# Patient Record
Sex: Female | Born: 1975 | Race: White | Hispanic: No | Smoking: Current every day smoker
Health system: Southern US, Community
[De-identification: ages and names within clinical notes are randomized; demographics above are authoritative.]

## PROBLEM LIST (undated history)

## (undated) DIAGNOSIS — K029 Dental caries, unspecified: Secondary | ICD-10-CM

## (undated) DIAGNOSIS — F329 Major depressive disorder, single episode, unspecified: Secondary | ICD-10-CM

## (undated) DIAGNOSIS — K589 Irritable bowel syndrome without diarrhea: Secondary | ICD-10-CM

## (undated) DIAGNOSIS — B9689 Other specified bacterial agents as the cause of diseases classified elsewhere: Secondary | ICD-10-CM

## (undated) DIAGNOSIS — K219 Gastro-esophageal reflux disease without esophagitis: Secondary | ICD-10-CM

## (undated) DIAGNOSIS — R319 Hematuria, unspecified: Secondary | ICD-10-CM

## (undated) DIAGNOSIS — N760 Acute vaginitis: Secondary | ICD-10-CM

## (undated) DIAGNOSIS — F410 Panic disorder [episodic paroxysmal anxiety] without agoraphobia: Secondary | ICD-10-CM

## (undated) DIAGNOSIS — N898 Other specified noninflammatory disorders of vagina: Secondary | ICD-10-CM

## (undated) DIAGNOSIS — R1032 Left lower quadrant pain: Secondary | ICD-10-CM

## (undated) DIAGNOSIS — K5900 Constipation, unspecified: Secondary | ICD-10-CM

## (undated) DIAGNOSIS — R300 Dysuria: Secondary | ICD-10-CM

## (undated) DIAGNOSIS — N3281 Overactive bladder: Secondary | ICD-10-CM

## (undated) DIAGNOSIS — G43909 Migraine, unspecified, not intractable, without status migrainosus: Secondary | ICD-10-CM

## (undated) DIAGNOSIS — B009 Herpesviral infection, unspecified: Secondary | ICD-10-CM

## (undated) DIAGNOSIS — R232 Flushing: Secondary | ICD-10-CM

## (undated) DIAGNOSIS — I10 Essential (primary) hypertension: Secondary | ICD-10-CM

## (undated) DIAGNOSIS — Z9223 Personal history of estrogen therapy: Secondary | ICD-10-CM

## (undated) HISTORY — DX: Other specified bacterial agents as the cause of diseases classified elsewhere: B96.89

## (undated) HISTORY — DX: Dental caries, unspecified: K02.9

## (undated) HISTORY — PX: ABDOMINAL HYSTERECTOMY: SHX81

## (undated) HISTORY — DX: Panic disorder (episodic paroxysmal anxiety): F41.0

## (undated) HISTORY — DX: Flushing: R23.2

## (undated) HISTORY — DX: Essential (primary) hypertension: I10

## (undated) HISTORY — DX: Constipation, unspecified: K59.00

## (undated) HISTORY — DX: Overactive bladder: N32.81

## (undated) HISTORY — DX: Personal history of estrogen therapy: Z92.23

## (undated) HISTORY — DX: Acute vaginitis: N76.0

## (undated) HISTORY — DX: Other specified noninflammatory disorders of vagina: N89.8

## (undated) HISTORY — DX: Hematuria, unspecified: R31.9

## (undated) HISTORY — DX: Herpesviral infection, unspecified: B00.9

## (undated) HISTORY — DX: Left lower quadrant pain: R10.32

## (undated) HISTORY — DX: Major depressive disorder, single episode, unspecified: F32.9

## (undated) HISTORY — DX: Dysuria: R30.0

---

## 2001-06-02 ENCOUNTER — Ambulatory Visit (HOSPITAL_COMMUNITY): Admission: AD | Admit: 2001-06-02 | Discharge: 2001-06-02 | Payer: Self-pay | Admitting: *Deleted

## 2001-06-15 ENCOUNTER — Ambulatory Visit (HOSPITAL_COMMUNITY): Admission: RE | Admit: 2001-06-15 | Discharge: 2001-06-15 | Payer: Self-pay | Admitting: *Deleted

## 2001-06-15 ENCOUNTER — Encounter: Payer: Self-pay | Admitting: *Deleted

## 2001-08-02 ENCOUNTER — Emergency Department (HOSPITAL_COMMUNITY): Admission: EM | Admit: 2001-08-02 | Discharge: 2001-08-03 | Payer: Self-pay | Admitting: Internal Medicine

## 2001-10-08 ENCOUNTER — Ambulatory Visit (HOSPITAL_COMMUNITY): Admission: RE | Admit: 2001-10-08 | Discharge: 2001-10-08 | Payer: Self-pay | Admitting: *Deleted

## 2001-10-24 ENCOUNTER — Inpatient Hospital Stay (HOSPITAL_COMMUNITY): Admission: RE | Admit: 2001-10-24 | Discharge: 2001-10-28 | Payer: Self-pay | Admitting: *Deleted

## 2001-10-27 ENCOUNTER — Encounter: Payer: Self-pay | Admitting: *Deleted

## 2003-03-03 ENCOUNTER — Encounter: Payer: Self-pay | Admitting: *Deleted

## 2003-03-03 ENCOUNTER — Emergency Department (HOSPITAL_COMMUNITY): Admission: EM | Admit: 2003-03-03 | Discharge: 2003-03-03 | Payer: Self-pay | Admitting: *Deleted

## 2003-04-29 ENCOUNTER — Encounter: Payer: Self-pay | Admitting: Emergency Medicine

## 2003-04-29 ENCOUNTER — Emergency Department (HOSPITAL_COMMUNITY): Admission: EM | Admit: 2003-04-29 | Discharge: 2003-04-29 | Payer: Self-pay | Admitting: Emergency Medicine

## 2003-06-16 ENCOUNTER — Emergency Department (HOSPITAL_COMMUNITY): Admission: EM | Admit: 2003-06-16 | Discharge: 2003-06-16 | Payer: Self-pay | Admitting: Emergency Medicine

## 2003-12-21 ENCOUNTER — Emergency Department (HOSPITAL_COMMUNITY): Admission: EM | Admit: 2003-12-21 | Discharge: 2003-12-22 | Payer: Self-pay

## 2004-01-26 ENCOUNTER — Emergency Department (HOSPITAL_COMMUNITY): Admission: EM | Admit: 2004-01-26 | Discharge: 2004-01-26 | Payer: Self-pay | Admitting: Emergency Medicine

## 2004-02-12 ENCOUNTER — Inpatient Hospital Stay (HOSPITAL_COMMUNITY): Admission: RE | Admit: 2004-02-12 | Discharge: 2004-02-15 | Payer: Self-pay | Admitting: Obstetrics & Gynecology

## 2004-10-12 ENCOUNTER — Emergency Department (HOSPITAL_COMMUNITY): Admission: EM | Admit: 2004-10-12 | Discharge: 2004-10-12 | Payer: Self-pay | Admitting: Emergency Medicine

## 2005-03-25 ENCOUNTER — Emergency Department (HOSPITAL_COMMUNITY): Admission: EM | Admit: 2005-03-25 | Discharge: 2005-03-25 | Payer: Self-pay | Admitting: Emergency Medicine

## 2005-05-16 ENCOUNTER — Emergency Department (HOSPITAL_COMMUNITY): Admission: EM | Admit: 2005-05-16 | Discharge: 2005-05-16 | Payer: Self-pay | Admitting: Emergency Medicine

## 2005-06-14 ENCOUNTER — Emergency Department (HOSPITAL_COMMUNITY): Admission: EM | Admit: 2005-06-14 | Discharge: 2005-06-14 | Payer: Self-pay | Admitting: Emergency Medicine

## 2005-11-18 ENCOUNTER — Emergency Department (HOSPITAL_COMMUNITY): Admission: EM | Admit: 2005-11-18 | Discharge: 2005-11-18 | Payer: Self-pay | Admitting: Emergency Medicine

## 2005-12-24 ENCOUNTER — Emergency Department (HOSPITAL_COMMUNITY): Admission: EM | Admit: 2005-12-24 | Discharge: 2005-12-24 | Payer: Self-pay | Admitting: Family Medicine

## 2006-03-21 ENCOUNTER — Emergency Department (HOSPITAL_COMMUNITY): Admission: EM | Admit: 2006-03-21 | Discharge: 2006-03-21 | Payer: Self-pay | Admitting: Emergency Medicine

## 2006-03-24 ENCOUNTER — Emergency Department (HOSPITAL_COMMUNITY): Admission: EM | Admit: 2006-03-24 | Discharge: 2006-03-25 | Payer: Self-pay | Admitting: Emergency Medicine

## 2006-04-03 ENCOUNTER — Emergency Department (HOSPITAL_COMMUNITY): Admission: EM | Admit: 2006-04-03 | Discharge: 2006-04-03 | Payer: Self-pay | Admitting: Family Medicine

## 2006-05-27 ENCOUNTER — Emergency Department (HOSPITAL_COMMUNITY): Admission: EM | Admit: 2006-05-27 | Discharge: 2006-05-27 | Payer: Self-pay | Admitting: Family Medicine

## 2007-09-16 ENCOUNTER — Encounter: Admission: RE | Admit: 2007-09-16 | Discharge: 2007-09-27 | Payer: Self-pay | Admitting: Family Medicine

## 2007-10-15 ENCOUNTER — Emergency Department (HOSPITAL_COMMUNITY): Admission: EM | Admit: 2007-10-15 | Discharge: 2007-10-15 | Payer: Self-pay | Admitting: Emergency Medicine

## 2007-11-09 ENCOUNTER — Emergency Department (HOSPITAL_COMMUNITY): Admission: EM | Admit: 2007-11-09 | Discharge: 2007-11-10 | Payer: Self-pay | Admitting: Emergency Medicine

## 2009-05-18 ENCOUNTER — Emergency Department (HOSPITAL_COMMUNITY): Admission: EM | Admit: 2009-05-18 | Discharge: 2009-05-18 | Payer: Self-pay | Admitting: Emergency Medicine

## 2009-08-01 ENCOUNTER — Emergency Department (HOSPITAL_COMMUNITY): Admission: EM | Admit: 2009-08-01 | Discharge: 2009-08-01 | Payer: Self-pay | Admitting: Emergency Medicine

## 2009-09-10 ENCOUNTER — Emergency Department (HOSPITAL_COMMUNITY): Admission: EM | Admit: 2009-09-10 | Discharge: 2009-09-10 | Payer: Self-pay | Admitting: Emergency Medicine

## 2009-10-22 ENCOUNTER — Emergency Department (HOSPITAL_COMMUNITY): Admission: EM | Admit: 2009-10-22 | Discharge: 2009-10-22 | Payer: Self-pay | Admitting: Emergency Medicine

## 2009-11-19 ENCOUNTER — Emergency Department (HOSPITAL_COMMUNITY): Admission: EM | Admit: 2009-11-19 | Discharge: 2009-11-19 | Payer: Self-pay | Admitting: Emergency Medicine

## 2010-03-24 ENCOUNTER — Emergency Department (HOSPITAL_COMMUNITY): Admission: EM | Admit: 2010-03-24 | Discharge: 2010-03-25 | Payer: Self-pay | Admitting: Emergency Medicine

## 2010-04-22 ENCOUNTER — Emergency Department (HOSPITAL_COMMUNITY): Admission: EM | Admit: 2010-04-22 | Discharge: 2010-04-22 | Payer: Self-pay | Admitting: Emergency Medicine

## 2010-09-03 ENCOUNTER — Emergency Department (HOSPITAL_COMMUNITY): Admission: EM | Admit: 2010-09-03 | Discharge: 2010-09-03 | Payer: Self-pay | Admitting: Emergency Medicine

## 2011-01-25 ENCOUNTER — Emergency Department (HOSPITAL_COMMUNITY)
Admission: EM | Admit: 2011-01-25 | Discharge: 2011-01-25 | Disposition: A | Discharge status: Home / Self Care | Payer: Self-pay | Attending: Emergency Medicine | Admitting: Emergency Medicine

## 2011-01-25 ENCOUNTER — Emergency Department (HOSPITAL_COMMUNITY): Payer: Self-pay

## 2011-01-25 DIAGNOSIS — R112 Nausea with vomiting, unspecified: Secondary | ICD-10-CM | POA: Insufficient documentation

## 2011-01-25 DIAGNOSIS — R109 Unspecified abdominal pain: Secondary | ICD-10-CM | POA: Insufficient documentation

## 2011-01-25 DIAGNOSIS — K5289 Other specified noninfective gastroenteritis and colitis: Secondary | ICD-10-CM | POA: Insufficient documentation

## 2011-01-25 LAB — CBC
HCT: 37.6 % (ref 36.0–46.0)
Hemoglobin: 12.9 g/dL (ref 12.0–15.0)
MCH: 31.2 pg (ref 26.0–34.0)
MCHC: 34.3 g/dL (ref 30.0–36.0)
MCV: 90.8 fL (ref 78.0–100.0)
Platelets: 211 10*3/uL (ref 150–400)
RBC: 4.14 MIL/uL (ref 3.87–5.11)
RDW: 13.2 % (ref 11.5–15.5)
WBC: 16.9 10*3/uL — ABNORMAL HIGH (ref 4.0–10.5)

## 2011-01-25 LAB — URINALYSIS, ROUTINE W REFLEX MICROSCOPIC
Bilirubin Urine: NEGATIVE
Hgb urine dipstick: NEGATIVE
Ketones, ur: >80 mg/dL — AB
Leukocytes, UA: NEGATIVE
Nitrite: NEGATIVE
Protein, ur: 100 mg/dL — AB
Specific Gravity, Urine: 1.01 (ref 1.005–1.030)
Urine Glucose, Fasting: NEGATIVE mg/dL
Urobilinogen, UA: 1 mg/dL (ref 0.0–1.0)
pH: >9 — ABNORMAL HIGH (ref 5.0–8.0)

## 2011-01-25 LAB — DIFFERENTIAL
Basophils Absolute: 0 10*3/uL (ref 0.0–0.1)
Basophils Relative: 0 % (ref 0–1)
Eosinophils Absolute: 0 10*3/uL (ref 0.0–0.7)
Eosinophils Relative: 0 % (ref 0–5)
Lymphocytes Relative: 5 % — ABNORMAL LOW (ref 12–46)
Lymphs Abs: 0.9 10*3/uL (ref 0.7–4.0)
Monocytes Absolute: 0.8 10*3/uL (ref 0.1–1.0)
Monocytes Relative: 4 % (ref 3–12)
Neutro Abs: 15.2 10*3/uL — ABNORMAL HIGH (ref 1.7–7.7)
Neutrophils Relative %: 90 % — ABNORMAL HIGH (ref 43–77)

## 2011-01-25 LAB — COMPREHENSIVE METABOLIC PANEL
ALT: 12 U/L (ref 0–35)
AST: 19 U/L (ref 0–37)
Albumin: 4.3 g/dL (ref 3.5–5.2)
Alkaline Phosphatase: 43 U/L (ref 39–117)
BUN: 9 mg/dL (ref 6–23)
CO2: 23 mEq/L (ref 19–32)
Calcium: 9.2 mg/dL (ref 8.4–10.5)
Chloride: 104 mEq/L (ref 96–112)
Creatinine, Ser: 0.73 mg/dL (ref 0.4–1.2)
GFR calc Af Amer: >60 mL/min (ref >60)
GFR calc non Af Amer: >60 mL/min (ref >60)
Glucose, Bld: 113 mg/dL — ABNORMAL HIGH (ref 70–99)
Potassium: 3.3 mEq/L — ABNORMAL LOW (ref 3.5–5.1)
Sodium: 139 mEq/L (ref 135–145)
Total Bilirubin: 0.9 mg/dL (ref 0.3–1.2)
Total Protein: 7.4 g/dL (ref 6.0–8.3)

## 2011-01-25 LAB — URINE MICROSCOPIC-ADD ON

## 2011-01-25 LAB — POCT PREGNANCY, URINE: Preg Test, Ur: NEGATIVE

## 2011-01-25 LAB — LIPASE, BLOOD: Lipase: 19 U/L (ref 11–59)

## 2011-01-25 MED ORDER — IOHEXOL 300 MG/ML  SOLN
100.0000 mL | Freq: Once | INTRAMUSCULAR | Status: AC | PRN
  Administered 2011-01-25: 100 mL via INTRAVENOUS

## 2011-02-11 LAB — COMPREHENSIVE METABOLIC PANEL
ALT: 9 U/L (ref 0–35)
AST: 15 U/L (ref 0–37)
Albumin: 3.9 g/dL (ref 3.5–5.2)
Alkaline Phosphatase: 38 U/L — ABNORMAL LOW (ref 39–117)
BUN: 8 mg/dL (ref 6–23)
CO2: 26 mEq/L (ref 19–32)
Calcium: 9.2 mg/dL (ref 8.4–10.5)
Chloride: 106 mEq/L (ref 96–112)
Creatinine, Ser: 0.69 mg/dL (ref 0.4–1.2)
GFR calc Af Amer: >60 mL/min (ref >60)
GFR calc non Af Amer: >60 mL/min (ref >60)
Glucose, Bld: 95 mg/dL (ref 70–99)
Potassium: 3.8 mEq/L (ref 3.5–5.1)
Sodium: 138 mEq/L (ref 135–145)
Total Bilirubin: 0.5 mg/dL (ref 0.3–1.2)
Total Protein: 6.8 g/dL (ref 6.0–8.3)

## 2011-02-11 LAB — URINALYSIS, ROUTINE W REFLEX MICROSCOPIC
Bilirubin Urine: NEGATIVE
Glucose, UA: NEGATIVE mg/dL
Hgb urine dipstick: NEGATIVE
Ketones, ur: NEGATIVE mg/dL
Nitrite: NEGATIVE
Protein, ur: NEGATIVE mg/dL
Specific Gravity, Urine: 1.005 (ref 1.005–1.030)
Urobilinogen, UA: 0.2 mg/dL (ref 0.0–1.0)
pH: 6 (ref 5.0–8.0)

## 2011-02-11 LAB — DIFFERENTIAL
Basophils Absolute: 0 10*3/uL (ref 0.0–0.1)
Basophils Relative: 1 % (ref 0–1)
Eosinophils Absolute: 0 10*3/uL (ref 0.0–0.7)
Eosinophils Relative: 1 % (ref 0–5)
Lymphocytes Relative: 30 % (ref 12–46)
Lymphs Abs: 1.9 10*3/uL (ref 0.7–4.0)
Monocytes Absolute: 0.3 10*3/uL (ref 0.1–1.0)
Monocytes Relative: 5 % (ref 3–12)
Neutro Abs: 3.9 10*3/uL (ref 1.7–7.7)
Neutrophils Relative %: 63 % (ref 43–77)

## 2011-02-11 LAB — CBC
HCT: 36.9 % (ref 36.0–46.0)
Hemoglobin: 12.5 g/dL (ref 12.0–15.0)
MCH: 31.7 pg (ref 26.0–34.0)
MCHC: 33.8 g/dL (ref 30.0–36.0)
MCV: 94 fL (ref 78.0–100.0)
Platelets: 202 10*3/uL (ref 150–400)
RBC: 3.93 MIL/uL (ref 3.87–5.11)
RDW: 12.4 % (ref 11.5–15.5)
WBC: 6.1 10*3/uL (ref 4.0–10.5)

## 2011-02-17 LAB — URINALYSIS, ROUTINE W REFLEX MICROSCOPIC
Bilirubin Urine: NEGATIVE
Glucose, UA: NEGATIVE mg/dL
Hgb urine dipstick: NEGATIVE
Nitrite: NEGATIVE
Protein, ur: NEGATIVE mg/dL
Specific Gravity, Urine: >1.03 — ABNORMAL HIGH (ref 1.005–1.030)
Urobilinogen, UA: 0.2 mg/dL (ref 0.0–1.0)
pH: 6 (ref 5.0–8.0)

## 2011-02-17 LAB — PREGNANCY, URINE: Preg Test, Ur: NEGATIVE

## 2011-03-02 LAB — URINALYSIS, ROUTINE W REFLEX MICROSCOPIC
Bilirubin Urine: NEGATIVE
Glucose, UA: NEGATIVE mg/dL
Hgb urine dipstick: NEGATIVE
Ketones, ur: NEGATIVE mg/dL
Nitrite: NEGATIVE
Protein, ur: NEGATIVE mg/dL
Specific Gravity, Urine: 1.01 (ref 1.005–1.030)
Urobilinogen, UA: 0.2 mg/dL (ref 0.0–1.0)
pH: 7.5 (ref 5.0–8.0)

## 2011-03-04 LAB — URINE MICROSCOPIC-ADD ON

## 2011-03-04 LAB — URINALYSIS, ROUTINE W REFLEX MICROSCOPIC
Bilirubin Urine: NEGATIVE
Glucose, UA: NEGATIVE mg/dL
Hgb urine dipstick: NEGATIVE
Ketones, ur: NEGATIVE mg/dL
Nitrite: NEGATIVE
Protein, ur: NEGATIVE mg/dL
Specific Gravity, Urine: 1.025 (ref 1.005–1.030)
Urobilinogen, UA: 0.2 mg/dL (ref 0.0–1.0)
pH: 5.5 (ref 5.0–8.0)

## 2011-03-04 LAB — URINE CULTURE: Colony Count: 1000

## 2011-03-04 LAB — WET PREP, GENITAL
Trich, Wet Prep: NONE SEEN
Yeast Wet Prep HPF POC: NONE SEEN

## 2011-03-04 LAB — GC/CHLAMYDIA PROBE AMP, GENITAL
Chlamydia, DNA Probe: NEGATIVE
GC Probe Amp, Genital: NEGATIVE

## 2011-03-05 LAB — URINE CULTURE: Colony Count: 3000

## 2011-03-05 LAB — URINALYSIS, ROUTINE W REFLEX MICROSCOPIC
Bilirubin Urine: NEGATIVE
Glucose, UA: NEGATIVE mg/dL
Ketones, ur: NEGATIVE mg/dL
Nitrite: NEGATIVE
Protein, ur: NEGATIVE mg/dL
Specific Gravity, Urine: 1.025 (ref 1.005–1.030)
Urobilinogen, UA: 0.2 mg/dL (ref 0.0–1.0)
pH: 6 (ref 5.0–8.0)

## 2011-03-05 LAB — URINE MICROSCOPIC-ADD ON

## 2011-03-06 LAB — URINALYSIS, ROUTINE W REFLEX MICROSCOPIC
Bilirubin Urine: NEGATIVE
Glucose, UA: NEGATIVE mg/dL
Hgb urine dipstick: NEGATIVE
Ketones, ur: NEGATIVE mg/dL
Nitrite: NEGATIVE
Protein, ur: NEGATIVE mg/dL
Specific Gravity, Urine: 1.02 (ref 1.005–1.030)
Urobilinogen, UA: 1 mg/dL (ref 0.0–1.0)
pH: 7.5 (ref 5.0–8.0)

## 2011-03-06 LAB — RAPID STREP SCREEN (MED CTR MEBANE ONLY): Streptococcus, Group A Screen (Direct): POSITIVE — AB

## 2011-03-09 LAB — URINALYSIS, ROUTINE W REFLEX MICROSCOPIC
Bilirubin Urine: NEGATIVE
Glucose, UA: NEGATIVE mg/dL
Hgb urine dipstick: NEGATIVE
Ketones, ur: NEGATIVE mg/dL
Nitrite: NEGATIVE
Protein, ur: NEGATIVE mg/dL
Specific Gravity, Urine: 1.01 (ref 1.005–1.030)
Urobilinogen, UA: 1 mg/dL (ref 0.0–1.0)
pH: 6.5 (ref 5.0–8.0)

## 2011-03-09 LAB — WET PREP, GENITAL
Trich, Wet Prep: NONE SEEN
WBC, Wet Prep HPF POC: NONE SEEN
Yeast Wet Prep HPF POC: NONE SEEN

## 2011-03-09 LAB — POCT PREGNANCY, URINE: Preg Test, Ur: NEGATIVE

## 2011-03-09 LAB — GC/CHLAMYDIA PROBE AMP, GENITAL
Chlamydia, DNA Probe: NEGATIVE
GC Probe Amp, Genital: NEGATIVE

## 2011-04-17 NOTE — Group Therapy Note (Signed)
[facility]  Patient:    Debbie [name], Debbie [name] Visit Number: [id number] MRN: [id number]          Service Type: OBS Location: 4A A417 01 Attending Physician:  Jeri Cos. Dictated by:   Langley Gauss, M.D. Admit Date:  10/24/2001   CC:         Labor & Delivery, [facility]  Christin Bach, M.D.   Progress Note  Status post repeat low transverse cesarean section October 24, 2001. 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  The patient has accumulatively postoperatively now received 4 units of packed red blood cells transfused. Over the last 24 hours her urine output has been excellent with clear yellow urine.  Vital signs have remained stable with no tachycardia and no hypertension.  Hemoglobin following the fourth unit at 6.7.  Of note, the patients admission hemoglobin prior to surgery was 8.3.  SUBJECTIVE:  The patient does seem to tolerate a hemoglobin of 6.7 very well, with no significant light headedness with standing, no headaches, no ringing in the ears.  Postoperatively she has yet to recover bowel function.  Specifically, no passage of flatus.  She does, however, have some hypoactive bowel sounds.  SUBJECTIVE:  Physical examination shows edema is markedly improved over the last 48 hours.  She has received two doses of 20 mg of IV Lasix, which have resulted in increased urine output as well as decreased edematous appearance.  PLAN:  1.  Check CBC a.m. of October 26, 2001 in consideration of patients current      clinical status and good tolerance of chronic anemia.  Would not      transfuse the patient unless hemoglobin is below 6 or if clinical      condition changes from current status and warrants transfusion.  2.  The patient still has a Foley catheter in place as well as dressing on      the wound and JP.  These can possibly be removed on October 26, 2001 if      blood indices are stable and the patient is prepared to ambulate.Dictated y:   Langley Gauss, M.D. Attending Physician:  Jeri Cos. DD:  10/26/01 TD:  10/26/01 Job: [id number]

## 2011-04-17 NOTE — Op Note (Signed)
[facility]  Patient:    Debbie [name], Debbie [name] Visit Number: [id number] MRN: [id number]          Service Type: OBS Location: 4A A417 01 Attending Physician:  Jeri Cos. Dictated by:   Langley Gauss, M.D. Proc. Date: 10/24/01 Admit Date:  10/24/2001                             Operative Report  PREOPERATIVE DIAGNOSES: 1. Previous low transverse cesarean section x 3. 2. Patient desires permanent sterilization.  PROCEDURE PERFORMED:  Repeat low transverse cesarean section with intraoperative bilateral tubal ligation delivered as a 5 pound 10 ounce female infant.  SURGEON:  Langley Gauss, M.D.  ANESTHESIA:  Spinal.  PEDIATRICIAN:  J. Aniou [place], M.D.  ESTIMATED BLOOD LOSS:  2-300 cc.  INTRAOPERATIVE BLOOD PRODUCTS:  Two units of pack red blood cells transfused to the patient in the operating room.  An additional 1 unit of packed red blood cells transfused to the patient in the recovery room.  DRAINS:  Foley catheter to straight drainage, draining clear yellow urine.  JP catheter within the subcutaneous space.  FINDINGS:  At time of surgery complicating the surgery:  The patient was noted to have 3 prior low transverse cesarean section and thus adhesive disease was encountered.  The fundal portion of the uterus was densely adherent to the anterior pelvic perineum making entry into the perineal cavity very difficult.  Likewise very dense fibrous adhesive bands were located bilaterally extending from the cornual portion of the uterus on each side to the anterior pelvic perineum in very close continuity with round ligament and fallopian tube on each side.  Having intraoperative blood loss was due to the very difficult entry into the perineal cavity thus multiple small bleeding sites were noted due to blunt dissection of perineum off of the uterus.  After delivery of the infant there was very difficult visualization of the uterine incision  with a small extension on the left and poor visualization. The uterus could not be manipulated secondary to the adhesive disease present, thus heavier than usual blood loss occurred from the edges of the uterine incision.  Likewise an additional bleeding was noted when the fibrous adhesive bands bilaterally were clamped and transected.  When there was noted to be backbleeding occurring from the adhesive band on the left after ligature had slipped.  HOSPITAL SUMMARY:  The patient was taken to the operating room, vital signs were stable.  The patient had spinal analgesic administered without complication.  She was then placed on the operating room table in a slight left lateral tilt, prepped and draped in usual sterile manner.  A Foley catheter was placed to straight drainage after assurance of adequate surgical analgesia.  A sharp knife was used to incise through the Pfannenstiel incision with dissection down to the fascial plane.  The fascia was then transected in a curvilinear manner utilizing the Mayo scissors to dissect off the underlying rectus muscle.  Fascial edges were then grasped using straight Kocher clamps and additional operative room was made by dissecting the rectus muscle in the midline of the overlying fascia.  The rectus muscles were then bluntly separated.  There was known to be very dense perineum here which made difficult entry into the perineal cavity.  Hemostat clamps were used to elevate perineum and an attempt was made to atraumatically enter the perineal cavity but this was not possible.  The hemostat clamps  were used to elevate visible perineum.  Continued to dissect deeper through it in an attempt to enter the perineal cavity.  At one point the perineal cavity was atraumatically and bluntly entered but efforts to extend the perineal incisions superiorly were limited due to the adhesive disease between the fundal portion of the uterus and the anterior perineal  wall.  The pelvic incision in the perineum, however, was extended inferiorly being careful not to enter the bladder.  Bladder blade was then placed.  A sharp knife was then used to score a low transverse uterine incision.  The lower uterine segment was poorly developed. Intact amniotic sac was encountered in the midline.  My index fingers then used to bluntly extend the uterine incision bilaterally.  Allis clamps were used to artificially rupture the membranes with findings of clear amniotic fluid.  My right hand then reached into the pelvic and uterine cavity.  The head of the infant was flexed.  I was unable at this time to elevate it through the incision due to a limited space provided.  Thus a Maylard incision was performed bilaterally in the rectus muscle.  This allowed me to elevate the head of the infant to the level of the incision which then allowed me to place the reusable Silastic suction in a ______ suction on the infant vertex.  A combination of fundal pressure and gentle traction at this time resulted in delivery of the infants vertex through the incision.  The remainder of the infant likewise delivered without difficulty.  Mouth and nares were bulb suctioned of clear amniotic fluid.  The cord was then milked towards the infant.  Cord was doubly clamped and cut and infant is handed to the nursing table where the awaiting pediatrician, Dr. Latrelle Dodrill is available.  Then I drew arterial cord gas, and cord blood from the umbilical cord.  Gentle traction on the umbilical cord resulted in separation of intact placenta. Intrauterine expiration revealed no retained placental fragments.  I was unable to exteriorize the uterus at this time secondary to the adhesive disease which limited mobility and manipulation of the uterus.  The edges of the uterine incision, however, were identified and grasped using the Pennington clamp.  The uterine incision was then closed utilizing 0 chronic  in a running locked fashion.  Multiple figure-of-eight sutures were required, along this incision then to maintain hemostasis.   During this time there was multiple bleeding spots along the perineal layer overlying the uterus resulting in fairly heavy blood loss.  There was continued blood loss at the fundal portion of the uterus which could not be visualized thus the adhesive bands to the cornual portion of the uterus bilaterally had to be transected.  Likewise patient desired permanent sterilization and we were not able to identify fallopian tube structures with the current visualization.  I was able to identify each cornual portion of the uterus and slip my fingers to find the free space in between the dense adhesive band and the round ligamenT of fallopian tube bilaterally. Each of these thick pedicles were then clamped utilizing a Kelly clamp and ligated utilizing a Heaney suture of 0 Vicryl.  The left as stated previously slipped, resultant backbleeding was noted to occur, this pedicle was then again clamped with a Kelly clamp and again then ligated with 0 Vicryl in a Heaney fashion.  After transection of these thick adhesive bands I was able to exteriorize the uterus which revealed the uterine incision, again, to have some small  bleeders requiring figure-of-eight sutures of 0 Vicryl.  The fallopian tubes were identified on each side and grasped with Babcock clamps.  Verification of the fallopian tube structures was made by tracing them out to its fimbriated end.  Each of the tubes was then separately elevated and two ties of #1 plain suture were based at the base of the loop of ______.  This allowed removal of a knuckle of right fallopian tube as well as left fallopian tube.  A suture was placed through the left.  The pedicles from the tubal ligation are noted to be excellent hemostasis.  The ovary is noted to be normal in appearance.  The cul-de-sac is then irrigated free of  all clots.  Hemostasis is assured within the pelvic cavity thus uterus is returned to the pelvic cavity.  The perineal edges are then grasped utilizing Kelly clamps and the perineum is closed with a continuous running 0 chromic suture.  Copious irrigation now was performed of the gutters bilaterally to confirm hemostasis.  Sponge and instrument counts were correct x 2 at this point.  the rectus muscles were likewise reapproximated midline utilizing 0 chromic suture.  Again, bleeding had been noted from the maylard incisions bilaterally which contributed to our heavy blood loss.  The fascia was then identified. Deep fascia is closed with a continuous running #1 PDS suture.  A JP drain is then placed within the subcutaneous space with a separate exit wound to the right apex of the incision, 3 interrupted #1 PDS sutures are then placed through and through the skin edges to facilitate reapproximation of the skin edges.  The skin is then completed closed utilizing skin staples.  Prior to stapling 20 cc. of 0.5% bupivacaine had been injected along the skin incision.  The patient tolerated the procedure well with no change in her vital signs throughout the operative procedure. Two units of packed red blood cells were transfused and plan to give a third in the recovery room as the patient is transported to the recovery room in stable condition.  The patient was noted to have significant itching during the operative procedure which was treated with IV Benadryl likely due to an adverse reaction to the IV Ancef administered. Dictated by:   Langley Gauss, M.D. Attending Physician:  Jeri Cos. DD:  10/24/01 TD:  10/24/01 Job: [id number]

## 2011-04-17 NOTE — Discharge Summary (Signed)
[facility]  Patient:    Debbie [name], Debbie [name] Visit Number: [id number] MRN: [id number]          Service Type: OBS Location: LDR LDR1 01 Attending Physician:  Jeri Cos. Dictated by:   Langley Gauss, M.D. Admit Date:  10/08/2001 Discharge Date: 10/08/2001                             Discharge Summary  OBSTETRICAL OBSERVATION NOTE  HISTORY OF PRESENT ILLNESS:  This is a 35 year old gravida 4, para 3 currently at 35-6/[redacted] weeks gestation who presents to [facility] complaining of seeing spots before her eyes as well as having a bifrontal headache x3 days duration.  The patient states that this is similar to the headache that she had previously in the pregnancy.  She has taken extra strength Tylenol with no relief, now, x3 days duration.  The headache is no worse than yesterday, or the day previously.  The patient also complains of increased swelling in her extremities.  The patients prenatal course is otherwise uncomplicated.  She is scheduled for a repeat low-transverse cesarean section secondary to a history of 3 prior cesarean sections.  PAST MEDICAL HISTORY: 1. The patient intermittently does have migraine-type headaches treated    effectively with narcotics on a p.r.n. basis. 2. Low-transverse cesarean section x3.  PHYSICAL EXAMINATION:  GENERAL:  The patient is in no acute distress, but does complain of bifrontal headache.  VITAL SIGNS:  Temperature 98.3, pulse 89, respiratory rate is 17, blood pressure 145/72, 143/81.  Pertinently a trace of protein.  HEENT:  Bifrontal tenderness in the area of the headache.  No sinus congestion.  Thyroid is not palpable.  NECK:  Supple.  LUNGS:  Clear.  CARDIOVASCULAR:  Regular rate and rhythm.  ABDOMEN:  Soft and nontender.  A Pfannenstiel incision is noted.  Fundal height is consistent with 36-5/[redacted] weeks gestation.  She is vertex presentation by Leopolds maneuvers.  EXTREMITIES:   Reveal only a trace pretibial edema.  X-RAY AND LABORATORY DATA:  External fetal monitor reviewed.  Reveals no uterine activity identified.  There is a reactive nonstress test noted with baseline in the 150s with accelerations of greater than 15 beats per minute, times greater than 15 seconds duration.  No fetal heart rate decelerations are noted.  Long term variability is noted to be normal.  ASSESSMENT:  A 35-6/7 weeks intrauterine pregnancy with migraine-type headaches unresponsive to p.o. Tylenol.  The patient with minimally elevated blood pressure but no other signs or symptoms of preeclampsia.  PLAN:  She is treated, at this time, with Tylox x1 and thereafter given outpatient prescription for Lortab 10/500 one p.o. q.6h. p.r.n. headache.  the patient will continue with modified bedrest and pertinently follow up in our office in 2 days time for a recheck for blood pressure. Dictated by:   Langley Gauss, M.D. Attending Physician:  Jeri Cos. DD:  10/09/01 TD:  10/10/01 Job: [id number]

## 2011-04-17 NOTE — Discharge Summary (Signed)
NAME:  Debbie [name], Debbie [name]                         ACCOUNT NO.:  [id number]   MEDICAL RECORD NO.:  [id number]                   PATIENT TYPE:  INP   LOCATION:  A404                                 FACILITY:  [facility]   PHYSICIAN:  Lazaro Arms, M.D.                DATE OF BIRTH:  [birth year]   DATE OF ADMISSION:  02/12/2004  DATE OF DISCHARGE:  02/15/2004                                 DISCHARGE SUMMARY   DISCHARGE DIAGNOSES:  1. Status post total abdominal hysterectomy/ right salpingo-oophorectomy.  2. Fibroid uterus.  3. 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   PROCEDURES:  Total abdominal hysterectomy, right salpingo-oophorectomy.   Please refer to transcribed history and physical and the operative note for  details of admission to the hospital.   HOSPITAL COURSE:  The patient was admitted postoperatively.  She had a  temperature elevation on the night of surgery to 101.3 degrees.  She did  have pneumonia two weeks ago that had resolved.  She had a repeat chest x-  ray that was clear.  She underwent breathing treatments and chest PT and her  temperature came down promptly.  She was also started on Levaquin.  The  patient otherwise has done well.  Her incision is clean, dry and intact.  Her abdominal exam is benign.  She has had flatus, bowel movements,  tolerating oral pain medicine.  She is ambulatory.  She is discharge to home  on the morning of postoperative day #3 in good stable condition.  Follow up  in the office on February 20, 2004, to have her staples removed.  She was given  Tylox and Motrin for pain, and Levaquin to continue because of her history  of recent pneumonia.  If she has any problems between now and then, she will  give [place] a call.     ___________________________________________                                         Lazaro Arms, M.D.   LHE/MEDQ  D:  02/15/2004  T:  02/15/2004  Job:  [id number]

## 2011-04-17 NOTE — Discharge Summary (Signed)
[facility]  Patient:    Debbie [name], Debbie [name] Visit Number: [id number] MRN: [id number]          Service Type: OBS Location: 4A A417 01 Attending Physician:  Jeri Cos. Dictated by:   Langley Gauss, M.D. Admit Date:  10/24/2001 Discharge Date: 10/28/2001                             Discharge Summary  DISCHARGE DIAGNOSIS: 1. Intrauterine pregnancy at 38+ weeks. 2. Previous low transverse cesarean section x 3. 3. The patient desires permanent sterilization.  PROCEDURES: 1. Repeat low transverse cesarean section with delivery of 5 pound 10.7 ounce    female infant. 2. Intraoperative tubal ligation. 3. Transfusion of three units total of packed red blood cells.  COMPLICATIONS: 1. Iron deficiency anemia during pregnancy. 2. Anemia secondary to blood loss. 3. Operative procedure complicated by significant adhesive disease resulting    in estimated blood loss intraoperatively of 2300 cc.  The patient received    two units of red blood cells intraoperatively.  A third unit of packed red    blood cells was transfused in the recovery room.  LABORATORY DATA AND X-RAY FINDINGS:  Admission hemoglobin and hematocrit 8.3 and 24.0.  HOSPITAL COURSE:  In the recovery room after receiving the two units, the patients hemoglobin and hematocrit was 6.5 and 16.3.  On postop day #2, hemoglobin was 6.1 and hematocrit 17.7.  The patient was not experiencing any signs or symptoms of anemia at that time and no additional units were transfused.  On the day of discharge, the patients hemoglobin was 6.2, hematocrit 18.7 and electrolytes within normal limits.  The patient is discharged to home on Hemocyte F #60 with no refill.  Staples were removed prior to discharge.  The patient is breast-feeding at the time of discharge. The patient is advised of the importance of taking the iron.  She is also aware that should any heavy vaginal bleeding occur, she should be  evaluated immediately due to her current anemic status.  The patient, however, is noted to be chronically anemic with her initial hemoglobin of 8.3.  She has exhibited no tendency towards excessive vaginal bleeding during the last 48 hours of hospitalization.  She has ambulated and tolerated regular, general diet.  Thus, the patient should do well following discharge.  The pediatrician is Dr. Latrelle Dodrill. Dictated by:   Langley Gauss, M.D. Attending Physician:  Jeri Cos. DD:  11/02/01 TD:  11/02/01 Job: [id number]

## 2011-04-17 NOTE — Op Note (Signed)
NAME:  Debbie [name], Debbie [name]                         ACCOUNT NO.:  [id number]   MEDICAL RECORD NO.:  [id number]                   PATIENT TYPE:  INP   LOCATION:  A404                                 FACILITY:  [facility]   PHYSICIAN:  Lazaro Arms, M.D.                DATE OF BIRTH:  [birth year]   DATE OF PROCEDURE:  02/12/2004  DATE OF DISCHARGE:                                 OPERATIVE REPORT   PREOPERATIVE DIAGNOSES:  1. Menometrorrhagia.  2. Dysmenorrhea.  3. Anemia.  4. Fibroids.  5. Right ovarian cyst.   POSTOPERATIVE DIAGNOSES:  1. Menometrorrhagia.  2. Dysmenorrhea.  3. Anemia.  4. Fibroids.  5. Right ovarian cyst.  6. Dense, wide, large adhesion of the anterior uterine wall to the posterior     rectus muscles on the left.   PROCEDURE:  1. Total abdominal hysterectomy.  2. Right salpingo-oophorectomy.   SURGEON:  Lazaro Arms, M.D.   ANESTHESIA:  General endotracheal.   FINDINGS:  The patient had really bad abdominal wall adhesions.  She really  did not have any intraperitoneal adhesions but it was quite difficult to  ensure.  We entered the peritoneal cavity without injuring the bladder or  bowel. She had a very dense, almost cord-like adhesions in the anterior  uterine wall to the posterior rectus muscles.  There was really no  peritoneal cavity on that side.  The right ovary had a cyst.  The left ovary  appeared to be normal.  The uterus had some small fibroids.  There were some  adhesions of the omentum as well.   DESCRIPTION OF PROCEDURE:  The patient was taken to the operating room and  placed in the supine position and underwent general endotracheal anesthesia.  Her vagina was prepped and Foley catheter was placed.  Her abdomen was  prepped and draped in the usual sterile fashion.  A Pfannenstiel skin  incision was made and taken to the rectus fascia.  The fascia was incised in  the midline; and it was obvious, at this point, that there was dense  adherence of  the fascia to the muscles and anterior uterine wall.  I was  able to get into the peritoneal cavity on the lateral right and then dissect  over.  I was not able to enter the peritoneum on the left.  I got the  infundibulopelvic pelvic ligament on the right.  It was double clamped, cut,  and double suture ligated with good hemostasis.  The round ligament was  clamped, cut, and suture ligated.  The uterine vessels were skeletonized on  the right and clamped, cut, and suture ligated.   The bladder was taken down off the uterus without difficulty.  Surprisingly  so after 3 C-sections.  I then, basically had to wrap my hand around the  uterus, see that there was no bowel adherent and use the electrocautery  Bovie to essentially  chisel the uterus off the posterior rectus sheath.  Hemoclips were used for hemostasis.  The utero-ovarian ligament was  isolated.  It was clamped, cut and double suture ligated and the left ovary  was conserved.  The uterine vessels were skeletonized on the left.  They  were clamped, cut and suture ligated.  Serial pedicles were taken down the  cervix through the cardinal ligament each pedicle being clamped, cut, and  suture ligated.   The vaginal angles were encountered.  They were cross-clamped and specimen  was removed.  Vaginal angle sutures were placed and the vagina was closed  with interrupted figure-of-eight sutures.  The pelvis was irrigated  vigorously.  There was good hemostasis.  The left ureter was identified and  found to be peristaltic and normal indeed to the area of the dissection and  the bladder was also found to be completely intact.  There was no blood in  the Foley line or catheter.  Intercede was placed around the left ovary to  prevent postoperative adhesions.  The dense cord-like adhesion on the left  rectus area that was adherent to the uterus was removed sharply with the  electrocautery unit.  Hemostasis was achieved with the electrocautery  unit  and with suturing.  The muscles were reapproximated loosely as best was  possible. It was very thin on the left.  The fascia was closed using #0  Vicryl running.  The subcutaneous tissue was made hemostatic and irrigated.  It was then undermined to allow free movement of the abdominal wall.  A  subcu pain pump catheter was placed; it was indicated for postoperative pain  management.  Interrupted 3-0 Monocryl's were placed to take the tension off  the skin.  Staples were placed for skin closure.  The patient tolerated the  procedure well.  She experienced 400 cc of blood loss and was taken to the  recovery room in good stable condition.  All counts were correct.  She  received Ancef prophylactically.  The uterus, cervix, right ovary and tube  were sent to pathology for evaluation.  The blood loss was 400 cc.      ___________________________________________                                            Lazaro Arms, M.D.   LHE/MEDQ  D:  02/12/2004  T:  02/12/2004  Job:  [id number]

## 2011-04-17 NOTE — H&P (Signed)
[facility]  Patient:    Debbie [name], Debbie [name] Visit Number: [id number] MRN: [id number]          Service Type: OBS Location: 4A A417 01 Attending Physician:  Jeri Cos. Dictated by:   Langley Gauss, M.D. Admit Date:  10/24/2001   CC:         Heidi Dach, M.D.   History and Physical  CHIEF COMPLAINT: This is a 35 year old gravida 4 para 3, three prior low transverse cesarean sections, who is at 38+ weeks gestation, admitted for repeat low transverse cesarean section and intraoperative tubal ligation.  HISTORY OF PRESENT ILLNESS: The patient accepts the inherent 2% failure rate associated with the procedure and understands that the tubal ligation is to be considered an irreversible procedure.  The patients prenatal course has been complicated by:  1. First trimester hyperemesis with weight loss.  2. The patient has been noncompliant with prenatal vitamins and has not     been on prenatal vitamins during the pregnancy, thus currently     experiencing iron deficiency anemia.  3. November 2000 the patient was noted to be positive for group B strep     in urine, which was treated at that time.  PAST MEDICAL HISTORY:  1. The patient has had three prior low transverse cesarean sections through     Pfannenstiel incision.  No complications noted during any of these     operative procedures.  2. 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  She did not     have any outbreaks during this pregnancy.  3. Also noted to have intermittent migraine headaches but these have not been     a problem during the pregnancy.  ALLERGIES: No known drug allergies.  CURRENT MEDICATIONS: Lortab p.r.n. for occasional headaches.  SOCIAL HISTORY: The patient is a smoker, smoking about two packs per day throughout the pregnancy despite counseling regarding the risks associated with it.  The father of the baby is named Daphine Deutscher.  PHYSICAL  EXAMINATION:  VITAL SIGNS: Height 5 feet 6 inches.  Prepregnancy weight 214 pounds.  On October 17, 2001 the patient weighed 230 pounds.  Blood pressure 132/88, pulse rate 80, respiratory rate 20.  HEENT: Negative.  NECK: No adenopathy.  Neck supple.  Thyroid not palpable.  LUNGS: Occasional wheezes, treated with Proventil inhaler.  CARDIOVASCULAR: Regular rate and rhythm.  ABDOMEN: Gravid uterus.  No tenderness elicited.  Fundal height 38 cm.  EXTREMITIES: Noted to be normal with only trace pretibial edema.  PELVIC: Vertex presentation by Leopolds maneuver.  Normal external genitalia. No lesions or ulcerations identified.  No abnormal vaginal discharge or vaginal bleeding identified.  Cervix noted to be closed, vertex presenting but not engaged.  ASSESSMENT: Term intrauterine pregnancy, patient with three prior low transverse cesarean sections, admitted for elective repeat at this time.  The patient does plan on breast feeding.  She would like to have tubal ligation performed.  Pediatrician will be Dr. Latrelle Dodrill. Dictated by:   Langley Gauss, M.D. Attending Physician:  Jeri Cos. DD:  10/23/01 TD:  10/24/01 Job: [id number]

## 2011-04-30 ENCOUNTER — Emergency Department (HOSPITAL_COMMUNITY)
Admission: EM | Admit: 2011-04-30 | Discharge: 2011-04-30 | Disposition: A | Discharge status: Home / Self Care | Payer: Self-pay | Attending: Emergency Medicine | Admitting: Emergency Medicine

## 2011-04-30 DIAGNOSIS — K219 Gastro-esophageal reflux disease without esophagitis: Secondary | ICD-10-CM | POA: Insufficient documentation

## 2011-04-30 DIAGNOSIS — R1013 Epigastric pain: Secondary | ICD-10-CM | POA: Insufficient documentation

## 2011-04-30 DIAGNOSIS — J45909 Unspecified asthma, uncomplicated: Secondary | ICD-10-CM | POA: Insufficient documentation

## 2011-04-30 DIAGNOSIS — R197 Diarrhea, unspecified: Secondary | ICD-10-CM | POA: Insufficient documentation

## 2011-04-30 DIAGNOSIS — R112 Nausea with vomiting, unspecified: Secondary | ICD-10-CM | POA: Insufficient documentation

## 2011-04-30 LAB — COMPREHENSIVE METABOLIC PANEL
ALT: 8 U/L (ref 0–35)
AST: 13 U/L (ref 0–37)
Albumin: 4.3 g/dL (ref 3.5–5.2)
Alkaline Phosphatase: 47 U/L (ref 39–117)
BUN: 10 mg/dL (ref 6–23)
CO2: 23 mEq/L (ref 19–32)
Calcium: 9.8 mg/dL (ref 8.4–10.5)
Chloride: 105 mEq/L (ref 96–112)
Creatinine, Ser: 0.72 mg/dL (ref 0.4–1.2)
GFR calc Af Amer: >60 mL/min (ref >60)
GFR calc non Af Amer: >60 mL/min (ref >60)
Glucose, Bld: 109 mg/dL — ABNORMAL HIGH (ref 70–99)
Potassium: 3.3 mEq/L — ABNORMAL LOW (ref 3.5–5.1)
Sodium: 139 mEq/L (ref 135–145)
Total Bilirubin: 0.3 mg/dL (ref 0.3–1.2)
Total Protein: 7.2 g/dL (ref 6.0–8.3)

## 2011-04-30 LAB — DIFFERENTIAL
Basophils Absolute: 0 10*3/uL (ref 0.0–0.1)
Basophils Relative: 0 % (ref 0–1)
Eosinophils Absolute: 0 10*3/uL (ref 0.0–0.7)
Eosinophils Relative: 0 % (ref 0–5)
Lymphocytes Relative: 8 % — ABNORMAL LOW (ref 12–46)
Lymphs Abs: 1 10*3/uL (ref 0.7–4.0)
Monocytes Absolute: 0.3 10*3/uL (ref 0.1–1.0)
Monocytes Relative: 3 % (ref 3–12)
Neutro Abs: 10.8 10*3/uL — ABNORMAL HIGH (ref 1.7–7.7)
Neutrophils Relative %: 89 % — ABNORMAL HIGH (ref 43–77)

## 2011-04-30 LAB — CBC
HCT: 40.3 % (ref 36.0–46.0)
Hemoglobin: 13.9 g/dL (ref 12.0–15.0)
MCH: 31.5 pg (ref 26.0–34.0)
MCHC: 34.5 g/dL (ref 30.0–36.0)
MCV: 91.4 fL (ref 78.0–100.0)
Platelets: 229 10*3/uL (ref 150–400)
RBC: 4.41 MIL/uL (ref 3.87–5.11)
RDW: 12.3 % (ref 11.5–15.5)
WBC: 12.1 10*3/uL — ABNORMAL HIGH (ref 4.0–10.5)

## 2011-04-30 LAB — URINALYSIS, ROUTINE W REFLEX MICROSCOPIC
Glucose, UA: NEGATIVE mg/dL
Hgb urine dipstick: NEGATIVE
Leukocytes, UA: NEGATIVE
Nitrite: NEGATIVE
Specific Gravity, Urine: 1.025 (ref 1.005–1.030)
Urobilinogen, UA: 0.2 mg/dL (ref 0.0–1.0)
pH: 6.5 (ref 5.0–8.0)

## 2011-04-30 LAB — URINE MICROSCOPIC-ADD ON

## 2011-04-30 LAB — LIPASE, BLOOD: Lipase: 28 U/L (ref 11–59)

## 2011-08-19 ENCOUNTER — Emergency Department (HOSPITAL_COMMUNITY)
Admission: EM | Admit: 2011-08-19 | Discharge: 2011-08-19 | Disposition: A | Discharge status: Home / Self Care | Payer: Medicaid Other | Attending: Emergency Medicine | Admitting: Emergency Medicine

## 2011-08-19 ENCOUNTER — Emergency Department (HOSPITAL_COMMUNITY): Payer: Medicaid Other

## 2011-08-19 ENCOUNTER — Other Ambulatory Visit: Payer: Self-pay

## 2011-08-19 DIAGNOSIS — K219 Gastro-esophageal reflux disease without esophagitis: Secondary | ICD-10-CM | POA: Insufficient documentation

## 2011-08-19 DIAGNOSIS — J019 Acute sinusitis, unspecified: Secondary | ICD-10-CM

## 2011-08-19 DIAGNOSIS — R079 Chest pain, unspecified: Secondary | ICD-10-CM | POA: Insufficient documentation

## 2011-08-19 DIAGNOSIS — I498 Other specified cardiac arrhythmias: Secondary | ICD-10-CM | POA: Insufficient documentation

## 2011-08-19 DIAGNOSIS — I440 Atrioventricular block, first degree: Secondary | ICD-10-CM | POA: Insufficient documentation

## 2011-08-19 DIAGNOSIS — J45909 Unspecified asthma, uncomplicated: Secondary | ICD-10-CM | POA: Insufficient documentation

## 2011-08-19 DIAGNOSIS — F17200 Nicotine dependence, unspecified, uncomplicated: Secondary | ICD-10-CM | POA: Insufficient documentation

## 2011-08-19 HISTORY — DX: Gastro-esophageal reflux disease without esophagitis: K21.9

## 2011-08-19 MED ORDER — HYDROCODONE-ACETAMINOPHEN 5-325 MG PO TABS
1.0000 | ORAL_TABLET | Freq: Once | ORAL | Status: AC
  Administered 2011-08-19: 1 via ORAL
  Filled 2011-08-19: qty 1

## 2011-08-19 MED ORDER — ALBUTEROL SULFATE HFA 108 (90 BASE) MCG/ACT IN AERS: 1.0000 | INHALATION_SPRAY | Freq: Four times a day (QID) | RESPIRATORY_TRACT | Status: DC | PRN

## 2011-08-19 MED ORDER — ALBUTEROL SULFATE (5 MG/ML) 0.5% IN NEBU
5.0000 mg | INHALATION_SOLUTION | Freq: Once | RESPIRATORY_TRACT | Status: AC
  Administered 2011-08-19: 5 mg via RESPIRATORY_TRACT
  Filled 2011-08-19: qty 1

## 2011-08-19 MED ORDER — AMOXICILLIN 500 MG PO CAPS: 500.0000 mg | ORAL_CAPSULE | Freq: Three times a day (TID) | ORAL | Status: AC

## 2011-08-19 MED ORDER — ONDANSETRON 8 MG PO TBDP
8.0000 mg | ORAL_TABLET | Freq: Once | ORAL | Status: AC
  Administered 2011-08-19: 8 mg via ORAL
  Filled 2011-08-19: qty 1

## 2011-08-19 NOTE — ED Provider Notes (Signed)
Medical screening examination/treatment/procedure(s) were performed by non-physician practitioner and as supervising physician I was immediately available for consultation/collaboration.  Nicholes Stairs, MD 08/19/11 1531

## 2011-08-19 NOTE — ED Notes (Signed)
C/o migraine and chest pain for 2days. C/o dull pain to chest. Nausea as well and sweaty. nad noted in triage.

## 2011-08-19 NOTE — ED Notes (Signed)
Pt states pain pill did not help but breathing tx did. Pt waiting to be reeval and disposition

## 2011-08-19 NOTE — Discharge Instructions (Signed)

## 2011-08-19 NOTE — ED Provider Notes (Signed)
History     CSN: [id number] Arrival date & time: 08/19/2011 10:09 AM   Chief Complaint  Patient presents with  . Migraine  . Chest Pain  . Nausea     (Include location/radiation/quality/duration/timing/severity/associated sxs/prior treatment) HPI Comments: Patient with multiple complaints including facial pain and pressure with nasal congestion,  Headache,  Cough with wheezing and dull chest pain with coughing.  Cough has been nonproductive.  She has had yellow nasal congestion,  Has had night sweats but without documented fevers.  She also describes an episode of loose stool yesterday evening,  Again once this am.  Denies abdominal pain,  But with nausea,  Worsened with cough episodes.  She was treated for a sinus infection about 3 months ago with a 7 day course of cipro,  Stating her symptoms never really improved.  Patient is a 35 y.o. female presenting with chest pain and URI. The history is provided by the patient.  Chest Pain Primary symptoms include fatigue, cough, wheezing and nausea. Pertinent negatives for primary symptoms include no fever, no shortness of breath, no abdominal pain, no vomiting and no dizziness.  Pertinent negatives for associated symptoms include no numbness and no weakness.    URI The primary symptoms include fatigue, headaches, cough, wheezing and nausea. Primary symptoms do not include fever, ear pain, sore throat, swollen glands, abdominal pain, vomiting, arthralgias or rash. The current episode started 2 days ago. This is a recurrent problem.  The headache is not associated with weakness.  Symptoms associated with the illness include chills, facial pain, sinus pressure, congestion and rhinorrhea. The illness is not associated with plugged ear sensation.     Past Medical History  Diagnosis Date  . GERD (gastroesophageal reflux disease)   . Asthma      Past Surgical History  Procedure Date  . Cesarean section   . Abdominal hysterectomy     No  family history on file.  History  Substance Use Topics  . Smoking status: Current Everyday Smoker  . Smokeless tobacco: Not on file  . Alcohol Use: No    OB History    Grav Para Term Preterm Abortions TAB SAB Ect Mult Living                  Review of Systems  Constitutional: Positive for chills and fatigue. Negative for fever.  HENT: Positive for congestion, rhinorrhea and sinus pressure. Negative for ear pain, sore throat and neck pain.   Eyes: Negative.   Respiratory: Positive for cough and wheezing. Negative for chest tightness and shortness of breath.   Cardiovascular: Negative for chest pain.  Gastrointestinal: Positive for nausea and diarrhea. Negative for vomiting and abdominal pain.  Genitourinary: Negative.   Musculoskeletal: Negative for joint swelling and arthralgias.  Skin: Negative.  Negative for rash and wound.  Neurological: Positive for headaches. Negative for dizziness, weakness, light-headedness and numbness.  Hematological: Negative.   Psychiatric/Behavioral: Negative.     Allergies  Advil and Metronidazole  Home Medications   Current Outpatient Rx  Name Route Sig Dispense Refill  . ALBUTEROL SULFATE HFA 108 (90 BASE) MCG/ACT IN AERS Inhalation Inhale 2 puffs into the lungs every 4 (four) hours as needed. asthma     . OMEPRAZOLE 20 MG PO CPDR Oral Take 20 mg by mouth daily.        Physical Exam    BP 131/104  Pulse 80  Temp(Src) 98.6 F (37 C) (Oral)  Resp 20  Ht 5\' 6"  (1.676 m)  Wt 172 lb (78.019 kg)  BMI 27.76 kg/m2  SpO2 100%  Physical Exam  Nursing note and vitals reviewed. Constitutional: She is oriented to person, place, and time. She appears well-developed and well-nourished.  HENT:  Head: Normocephalic and atraumatic.  Nose: Mucosal edema and rhinorrhea present. Right sinus exhibits maxillary sinus tenderness. Left sinus exhibits maxillary sinus tenderness.  Mouth/Throat: Uvula is midline, oropharynx is clear and moist and mucous  membranes are normal.  Eyes: Conjunctivae are normal.  Neck: Normal range of motion.  Cardiovascular: Normal rate, regular rhythm, normal heart sounds and intact distal pulses.   Pulmonary/Chest: Effort normal. She has wheezes in the right upper field.  Abdominal: Soft. Bowel sounds are normal. There is no tenderness. There is no rebound.  Musculoskeletal: Normal range of motion.  Neurological: She is alert and oriented to person, place, and time.  Skin: Skin is warm and dry. No rash noted.  Psychiatric: She has a normal mood and affect.    ED Course  Procedures   Patient given albuterol neb while here with complete resolution of wheezing.  Cough resolved.      MDM Bronchospasm/  Probably viral infection,  But with sx suggestive of acute sinusitis.  Amoxil 500 x 10 days.  Albuterol mdi.       Candis Musa, PA 08/19/11 1355    Date: 08/19/2011  Rate: 55  Rhythm: sinus bradycardia  QRS Axis: normal  Intervals: normal  ST/T Wave abnormalities: normal  Conduction Disutrbances:first-degree A-V block   Narrative Interpretation:   Old EKG Reviewed: none available    Candis Musa, PA 08/19/11 1411

## 2011-09-07 LAB — POCT PREGNANCY, URINE
Operator id: 257131
Preg Test, Ur: NEGATIVE

## 2011-09-07 LAB — I-STAT 8, (EC8 V) (CONVERTED LAB)
BUN: 6
Bicarbonate: 21.7
Chloride: 108
Glucose, Bld: 129 — ABNORMAL HIGH
HCT: 39
Hemoglobin: 13.3
Operator id: 257131
Potassium: 3.7
Sodium: 138
TCO2: 23
pCO2, Ven: 26.4 — ABNORMAL LOW
pH, Ven: 7.524 — ABNORMAL HIGH

## 2011-09-07 LAB — POCT I-STAT CREATININE
Creatinine, Ser: 0.9
Operator id: 257131

## 2011-10-25 ENCOUNTER — Emergency Department (HOSPITAL_COMMUNITY)
Admission: EM | Admit: 2011-10-25 | Discharge: 2011-10-25 | Disposition: A | Discharge status: Home / Self Care | Payer: Medicaid Other | Attending: Emergency Medicine | Admitting: Emergency Medicine

## 2011-10-25 ENCOUNTER — Encounter (HOSPITAL_COMMUNITY): Payer: Self-pay

## 2011-10-25 DIAGNOSIS — K219 Gastro-esophageal reflux disease without esophagitis: Secondary | ICD-10-CM | POA: Insufficient documentation

## 2011-10-25 DIAGNOSIS — G43909 Migraine, unspecified, not intractable, without status migrainosus: Secondary | ICD-10-CM | POA: Insufficient documentation

## 2011-10-25 DIAGNOSIS — Z9079 Acquired absence of other genital organ(s): Secondary | ICD-10-CM | POA: Insufficient documentation

## 2011-10-25 DIAGNOSIS — F17200 Nicotine dependence, unspecified, uncomplicated: Secondary | ICD-10-CM | POA: Insufficient documentation

## 2011-10-25 DIAGNOSIS — J45909 Unspecified asthma, uncomplicated: Secondary | ICD-10-CM | POA: Insufficient documentation

## 2011-10-25 HISTORY — DX: Migraine, unspecified, not intractable, without status migrainosus: G43.909

## 2011-10-25 MED ORDER — ONDANSETRON HCL 4 MG PO TABS: 8.0000 mg | ORAL_TABLET | Freq: Four times a day (QID) | ORAL | Status: AC

## 2011-10-25 MED ORDER — ONDANSETRON 8 MG PO TBDP
8.0000 mg | ORAL_TABLET | Freq: Once | ORAL | Status: AC
  Administered 2011-10-25: 8 mg via ORAL
  Filled 2011-10-25: qty 1

## 2011-10-25 MED ORDER — HYDROMORPHONE HCL PF 2 MG/ML IJ SOLN
2.0000 mg | Freq: Once | INTRAMUSCULAR | Status: AC
  Administered 2011-10-25: 2 mg via INTRAMUSCULAR

## 2011-10-25 MED ORDER — HYDROMORPHONE HCL PF 2 MG/ML IJ SOLN
2.0000 mg | Freq: Once | INTRAMUSCULAR | Status: DC
  Filled 2011-10-25: qty 1

## 2011-10-25 MED ORDER — HYDROCODONE-ACETAMINOPHEN 5-325 MG PO TABS: 1.0000 | ORAL_TABLET | ORAL | Status: AC | PRN

## 2011-10-25 NOTE — ED Provider Notes (Signed)
Scribed for Donnetta Hutching, MD, the patient was seen in room APA10/APA10 . This chart was scribed by [name].   CSN: [id number] Arrival date & time: 10/25/2011  9:11 PM   First MD Initiated Contact with Patient 10/25/11 2132      Chief Complaint  Patient presents with  . Migraine  . Emesis    (Consider location/radiation/quality/duration/timing/severity/associated sxs/prior treatment) HPI Debbie [name] is a 35 y.o. female who presents to the Emergency Department complaining of sudden onset HA with associated nausea. Pt with h/o migraine complains of bifrontal HA that radiates to occipital region. Pain is described as sharp, throbbing, constant, severe pain.  Pain is similar to typical migraines. Denies photophobia or visual disturbance. Pt treated with bc powder with no improvement. Pt usually comes to the ED 1 time per month for migraines. Says she does not have a PCP but will soon be seen at [facility].   Past Medical History  Diagnosis Date  . GERD (gastroesophageal reflux disease)   . Asthma   . Migraines     Past Surgical History  Procedure Date  . Cesarean section   . Abdominal hysterectomy     History reviewed. No pertinent family history.  History  Substance Use Topics  . Smoking status: Current Everyday Smoker  . Smokeless tobacco: Not on file  . Alcohol Use: No   Review of Systems 10 Systems reviewed and are negative for acute change except as noted in the HPI.  Allergies  Advil and Metronidazole  Home Medications   Current Outpatient Rx  Name Route Sig Dispense Refill  . ALBUTEROL SULFATE HFA 108 (90 BASE) MCG/ACT IN AERS Inhalation Inhale 1-2 puffs into the lungs every 6 (six) hours as needed for wheezing. 1 Inhaler 0  . OMEPRAZOLE 20 MG PO CPDR Oral Take 20 mg by mouth daily.        BP 137/96  Pulse 64  Temp(Src) 98.5 F (36.9 C) (Oral)  Resp 16  Ht 5\' 6"  (1.676 m)  Wt 183 lb (83.008 kg)  BMI 29.54 kg/m2  SpO2 100%  Physical Exam    Nursing note and vitals reviewed. Constitutional: She is oriented to person, place, and time. She appears well-developed and well-nourished.  HENT:  Head: Normocephalic and atraumatic.  Eyes: Conjunctivae and EOM are normal. Pupils are equal, round, and reactive to light.  Neck: Normal range of motion. Neck supple.  Cardiovascular: Normal rate and regular rhythm.   Pulmonary/Chest: Effort normal and breath sounds normal.  Abdominal: Soft. Bowel sounds are normal.  Musculoskeletal: Normal range of motion.  Neurological: She is alert and oriented to person, place, and time.  Skin: Skin is warm and dry.  Psychiatric: She has a normal mood and affect.    ED Course  Procedures (including critical care time) OTHER DATA REVIEWED: Nursing notes, vital signs, and past medical records reviewed.   DIAGNOSTIC STUDIES: Oxygen Saturation is 100% on room air, normal by my interpretation.    ED MEDICATIONS Medications  HYDROmorphone (DILAUDID) injection 2 mg   ondansetron (ZOFRAN-ODT) disintegrating tablet 8 mg     ED DISCHARGE MEDICATIONS New Prescriptions   No medications on file    No diagnosis found.   MDM  No neuro deficits. Feels better after pain medication   I personally performed the services described in this documentation, which was scribed in my presence. The recorded information has been reviewed and considered.       Donnetta Hutching, MD 10/25/11 2240

## 2011-10-25 NOTE — Discharge Instructions (Signed)
Medications for pain and nausea. Increase fluids. °

## 2011-10-25 NOTE — ED Notes (Signed)
Pt presents with migraine and vomiting. Pt stable. Pt states she tried [facility] powder with no relief.

## 2011-12-20 ENCOUNTER — Emergency Department (HOSPITAL_COMMUNITY): Payer: Medicaid Other

## 2011-12-20 ENCOUNTER — Emergency Department (HOSPITAL_COMMUNITY)
Admission: EM | Admit: 2011-12-20 | Discharge: 2011-12-20 | Disposition: A | Discharge status: Home / Self Care | Payer: Medicaid Other | Attending: Emergency Medicine | Admitting: Emergency Medicine

## 2011-12-20 ENCOUNTER — Encounter (HOSPITAL_COMMUNITY): Payer: Self-pay | Admitting: *Deleted

## 2011-12-20 DIAGNOSIS — R109 Unspecified abdominal pain: Secondary | ICD-10-CM | POA: Insufficient documentation

## 2011-12-20 DIAGNOSIS — K219 Gastro-esophageal reflux disease without esophagitis: Secondary | ICD-10-CM | POA: Insufficient documentation

## 2011-12-20 DIAGNOSIS — G43909 Migraine, unspecified, not intractable, without status migrainosus: Secondary | ICD-10-CM | POA: Insufficient documentation

## 2011-12-20 DIAGNOSIS — R1110 Vomiting, unspecified: Secondary | ICD-10-CM

## 2011-12-20 DIAGNOSIS — K589 Irritable bowel syndrome without diarrhea: Secondary | ICD-10-CM | POA: Insufficient documentation

## 2011-12-20 DIAGNOSIS — R112 Nausea with vomiting, unspecified: Secondary | ICD-10-CM | POA: Insufficient documentation

## 2011-12-20 DIAGNOSIS — K5900 Constipation, unspecified: Secondary | ICD-10-CM | POA: Insufficient documentation

## 2011-12-20 DIAGNOSIS — Z9079 Acquired absence of other genital organ(s): Secondary | ICD-10-CM | POA: Insufficient documentation

## 2011-12-20 DIAGNOSIS — F17200 Nicotine dependence, unspecified, uncomplicated: Secondary | ICD-10-CM | POA: Insufficient documentation

## 2011-12-20 DIAGNOSIS — J45909 Unspecified asthma, uncomplicated: Secondary | ICD-10-CM | POA: Insufficient documentation

## 2011-12-20 HISTORY — DX: Irritable bowel syndrome, unspecified: K58.9

## 2011-12-20 LAB — URINALYSIS, ROUTINE W REFLEX MICROSCOPIC
Bilirubin Urine: NEGATIVE
Glucose, UA: NEGATIVE mg/dL
Hgb urine dipstick: NEGATIVE
Ketones, ur: NEGATIVE mg/dL
Leukocytes, UA: NEGATIVE
Nitrite: NEGATIVE
Protein, ur: NEGATIVE mg/dL
Specific Gravity, Urine: 1.02 (ref 1.005–1.030)
Urobilinogen, UA: 0.2 mg/dL (ref 0.0–1.0)
pH: 8 (ref 5.0–8.0)

## 2011-12-20 MED ORDER — ONDANSETRON HCL 4 MG/2ML IJ SOLN
4.0000 mg | Freq: Once | INTRAMUSCULAR | Status: AC
  Administered 2011-12-20: 4 mg via INTRAVENOUS
  Filled 2011-12-20: qty 2

## 2011-12-20 MED ORDER — SODIUM CHLORIDE 0.9 % IV BOLUS (SEPSIS)
1000.0000 mL | Freq: Once | INTRAVENOUS | Status: AC
  Administered 2011-12-20: 1000 mL via INTRAVENOUS

## 2011-12-20 MED ORDER — BISACODYL 10 MG RE SUPP
10.0000 mg | Freq: Once | RECTAL | Status: AC
  Administered 2011-12-20: 10 mg via RECTAL
  Filled 2011-12-20: qty 1

## 2011-12-20 MED ORDER — PROMETHAZINE HCL 25 MG RE SUPP: RECTAL | Status: DC

## 2011-12-20 MED ORDER — SODIUM CHLORIDE 0.9 % IV SOLN
INTRAVENOUS | Status: DC
  Administered 2011-12-20: 13:00:00 via INTRAVENOUS

## 2011-12-20 MED ORDER — PROMETHAZINE HCL 25 MG/ML IJ SOLN
25.0000 mg | Freq: Once | INTRAMUSCULAR | Status: AC
  Administered 2011-12-20: 25 mg via INTRAVENOUS
  Filled 2011-12-20: qty 1

## 2011-12-20 MED ORDER — FLEET ENEMA 7-19 GM/118ML RE ENEM
1.0000 | ENEMA | Freq: Once | RECTAL | Status: AC
  Administered 2011-12-20: 1 via RECTAL

## 2011-12-20 MED ORDER — DIPHENHYDRAMINE HCL 50 MG/ML IJ SOLN
50.0000 mg | Freq: Once | INTRAMUSCULAR | Status: AC
  Administered 2011-12-20: 50 mg via INTRAVENOUS
  Filled 2011-12-20: qty 1

## 2011-12-20 NOTE — Discharge Instructions (Signed)
Use 1 dose (17 grams) of miralax in 8 ounces of water every 30 minutes for 3-4 hours or until you get good results. Use the phenergan suppositories for nausea or vomiting. Recheck if you seem worse instead of beter.

## 2011-12-20 NOTE — ED Notes (Signed)
Patient is still having emesis. Patient states she would like something for the nausea.

## 2011-12-20 NOTE — ED Notes (Signed)
Pt c/o abdominal pain, nausea, vomiting, chills and mucous blood streaked bowel movements since this am.

## 2011-12-20 NOTE — ED Provider Notes (Signed)
Scribed for Ward [name], MD, the patient was seen in room APA09/APA09 . This chart was scribed by [name].   CSN: [id number]  Arrival date & time 12/20/11  2841   First MD Initiated Contact with Patient 12/20/11 1025      Chief Complaint  Patient presents with  . Abdominal Pain    (Consider location/radiation/quality/duration/timing/severity/associated sxs/prior treatment) HPI Pt seen at 10:44 AM Debbie [name] is a 36 y.o. female who presents to the Emergency Department complaining of bilateral lower, cramping, abdominal pain. Pt woke up 4.5 hours ago with nausea, abdominal pain, and an urge to have a BM. Pt was unable to have BM. Pt began to vomit and have simultaneous urge to have BM. Pt has been unable to have BM says when she wiped when she went to the bathroom in the ED mucus and blood were on the tissue. Last normal BM was one week ago; Pt typically has daily BM. Pt denies change in diet or medications.  No history of similar symptoms. H/o 4 c-sections and hysterectomy.    PCP [facility]. Dr. Tanya Nones.   Past Medical History  Diagnosis Date  . GERD (gastroesophageal reflux disease)   . Asthma   . Migraines   . IBS (irritable bowel syndrome)     Past Surgical History  Procedure Date  . Cesarean section   . Abdominal hysterectomy     History reviewed. No pertinent family history.  History  Substance Use Topics  . Smoking status: Current Everyday Smoker  . Smokeless tobacco: Not on file  . Alcohol Use: No  Pt is not currently working. Working towards completing GED.    Review of Systems  Gastrointestinal: Positive for nausea, vomiting, abdominal pain and constipation.  All other systems reviewed and are negative.    Allergies  Metronidazole and Advil  Home Medications   Current Outpatient Rx  Name Route Sig Dispense Refill  . ALBUTEROL SULFATE HFA 108 (90 BASE) MCG/ACT IN AERS Inhalation Inhale 1-2 puffs into the lungs every 6 (six) hours  as needed for wheezing. 1 Inhaler 0  . BC HEADACHE POWDER PO Oral Take 1 packet by mouth every 4 (four) hours as needed. For headache    . OMEPRAZOLE 20 MG PO CPDR Oral Take 20 mg by mouth daily.        BP 170/91  Pulse 58  Temp(Src) 97.4 F (36.3 C) (Oral)  Resp 18  Ht 5\' 6"  (1.676 m)  Wt 183 lb (83.008 kg)  BMI 29.54 kg/m2  SpO2 100%  Vital signs normal except for bradycardia   Physical Exam  Nursing note and vitals reviewed. Constitutional: She is oriented to person, place, and time. She appears well-developed and well-nourished.  Non-toxic appearance. She does not appear ill. No distress.       Patient looks like she feels bad.  HENT:  Head: Normocephalic and atraumatic.  Right Ear: External ear normal.  Left Ear: External ear normal.  Nose: Nose normal. No mucosal edema or rhinorrhea.  Mouth/Throat: Oropharynx is clear and moist and mucous membranes are normal. No dental abscesses or uvula swelling.  Eyes: Conjunctivae and EOM are normal. Pupils are equal, round, and reactive to light.  Neck: Normal range of motion and full passive range of motion without pain. Neck supple.  Cardiovascular: Normal rate, regular rhythm and normal heart sounds.  Exam reveals no gallop and no friction rub.   No murmur heard. Pulmonary/Chest: Effort normal and breath sounds normal. No  respiratory distress. She has no wheezes. She has no rhonchi. She has no rales. She exhibits no tenderness and no crepitus.  Abdominal: Soft. Normal appearance and bowel sounds are normal. She exhibits no distension. There is tenderness. There is no rebound and no guarding.       Abdomen soft, she has some mild tenderness diffusely over lower abdomen. There is no distention. There is no guarding or rebound.  Musculoskeletal: Normal range of motion. She exhibits no edema and no tenderness.       Moves all extremities well.   Neurological: She is alert and oriented to person, place, and time. She has normal strength.  No cranial nerve deficit.  Skin: Skin is warm, dry and intact. No rash noted. No erythema. No pallor.  Psychiatric: She has a normal mood and affect. Her speech is normal and behavior is normal. Her mood appears not anxious.    ED Course  Procedures (including critical care time) DIAGNOSTIC STUDIES: Oxygen Saturation is 100% on room air, normal by my interpretation.    COORDINATION OF CARE:  Results for orders placed during the hospital encounter of 12/20/11  URINALYSIS, ROUTINE W REFLEX MICROSCOPIC      Component Value Range   Color, Urine YELLOW  YELLOW    APPearance HAZY (*) CLEAR    Specific Gravity, Urine 1.020  1.005 - 1.030    pH 8.0  5.0 - 8.0    Glucose, UA NEGATIVE  NEGATIVE (mg/dL)   Hgb urine dipstick NEGATIVE  NEGATIVE    Bilirubin Urine NEGATIVE  NEGATIVE    Ketones, ur NEGATIVE  NEGATIVE (mg/dL)   Protein, ur NEGATIVE  NEGATIVE (mg/dL)   Urobilinogen, UA 0.2  0.0 - 1.0 (mg/dL)   Nitrite NEGATIVE  NEGATIVE    Leukocytes, UA NEGATIVE  NEGATIVE    Laboratory interpretation all normal   Dg Abd Acute W/chest  12/20/2011  *RADIOLOGY REPORT*  Clinical Data: Constipation, vomiting, low abdominal pain  ACUTE ABDOMEN SERIES (ABDOMEN 2 VIEW & CHEST 1 VIEW)  Comparison: CT 01/25/2011  Findings: Cardiomediastinal silhouette is within normal limits. The lungs are clear. No pleural effusion.  No pneumothorax.  No acute osseous abnormality.  No free air beneath diaphragm.  Overall paucity of bowel gas but no gas filled dilated loop of bowel is identified.  No air fluid level.  No abnormal calcific opacity.  Left-sided pelvic clips are noted.  No acute osseous abnormality.  IMPRESSION: Normal exam.  Original Report Authenticated By: Harrel Lemon, M.D.   Xray shows a lot of stool in the ascending and descending colon.   ED MEDICATION Medications  0.9 %  sodium chloride infusion   sodium chloride 0.9 % bolus 1,000 mL (1000 mL Intravenous Given 12/20/11 1108)  promethazine  (PHENERGAN) injection 25 mg (25 mg Intravenous Given 12/20/11 1108)  diphenhydrAMINE (BENADRYL) injection 50 mg (50 mg Intravenous Given 12/20/11 1107)    12:47 PM Pt recheck. Discussed imaging results do not indicate blockage but show large amount of stool in colon. Pt agrees to receive enema in ED. Pt still feels nauseated at this time, but reports improvement in cramping.   Recheck at 14:50 patient unable to hold enema, hasn't had results. States she has miralax at home, discussed how to use it.   Diagnoses that have been ruled out:  None  Diagnoses that are still under consideration:  None  Final diagnoses:  Constipation  Vomiting   Plan discharge  Devoria Albe, MD, Armando Gang     MDM  I personally performed the services described in this documentation, which was scribed in my presence. The recorded information has been reviewed and considered.       Ward [name], MD 12/20/11 1538

## 2011-12-20 NOTE — ED Notes (Signed)
Still c/o nausea and vomiting of clear substance

## 2011-12-26 ENCOUNTER — Emergency Department (HOSPITAL_COMMUNITY): Payer: Medicaid Other

## 2011-12-26 ENCOUNTER — Encounter (HOSPITAL_COMMUNITY): Payer: Self-pay | Admitting: *Deleted

## 2011-12-26 ENCOUNTER — Emergency Department (HOSPITAL_COMMUNITY)
Admission: EM | Admit: 2011-12-26 | Discharge: 2011-12-26 | Disposition: A | Discharge status: Home / Self Care | Payer: Medicaid Other | Attending: Emergency Medicine | Admitting: Emergency Medicine

## 2011-12-26 DIAGNOSIS — G43909 Migraine, unspecified, not intractable, without status migrainosus: Secondary | ICD-10-CM | POA: Insufficient documentation

## 2011-12-26 DIAGNOSIS — R112 Nausea with vomiting, unspecified: Secondary | ICD-10-CM | POA: Insufficient documentation

## 2011-12-26 DIAGNOSIS — K921 Melena: Secondary | ICD-10-CM | POA: Insufficient documentation

## 2011-12-26 DIAGNOSIS — F17200 Nicotine dependence, unspecified, uncomplicated: Secondary | ICD-10-CM | POA: Insufficient documentation

## 2011-12-26 DIAGNOSIS — J45909 Unspecified asthma, uncomplicated: Secondary | ICD-10-CM | POA: Insufficient documentation

## 2011-12-26 DIAGNOSIS — R109 Unspecified abdominal pain: Secondary | ICD-10-CM | POA: Insufficient documentation

## 2011-12-26 DIAGNOSIS — K589 Irritable bowel syndrome without diarrhea: Secondary | ICD-10-CM | POA: Insufficient documentation

## 2011-12-26 DIAGNOSIS — K219 Gastro-esophageal reflux disease without esophagitis: Secondary | ICD-10-CM | POA: Insufficient documentation

## 2011-12-26 DIAGNOSIS — Z9079 Acquired absence of other genital organ(s): Secondary | ICD-10-CM | POA: Insufficient documentation

## 2011-12-26 MED ORDER — ONDANSETRON HCL 4 MG/2ML IJ SOLN
4.0000 mg | Freq: Once | INTRAMUSCULAR | Status: AC
  Administered 2011-12-26: 4 mg via INTRAVENOUS
  Filled 2011-12-26: qty 2

## 2011-12-26 MED ORDER — PROMETHAZINE HCL 25 MG PO TABS: 12.5000 mg | ORAL_TABLET | Freq: Four times a day (QID) | ORAL | Status: DC | PRN

## 2011-12-26 MED ORDER — MORPHINE SULFATE 2 MG/ML IJ SOLN
2.0000 mg | Freq: Once | INTRAMUSCULAR | Status: AC
  Administered 2011-12-26: 2 mg via INTRAVENOUS
  Filled 2011-12-26: qty 1

## 2011-12-26 MED ORDER — HYDROMORPHONE HCL PF 1 MG/ML IJ SOLN
1.0000 mg | Freq: Once | INTRAMUSCULAR | Status: AC
  Administered 2011-12-26: 1 mg via INTRAVENOUS
  Filled 2011-12-26: qty 1

## 2011-12-26 NOTE — Discharge Instructions (Signed)
Use the nausea medicine as needed. For the constipation, use Miralax twice a day for the next 5 days. If you begin to develop diarrhea, stop the miralax. There may be blood mixed with the stool since you have been constipated for so long. Try not to strain when going to the bathroom. Follow up with your doctor.

## 2011-12-26 NOTE — ED Notes (Signed)
Pt actively vomiting while edp was in room. Pt given pain and nausea med. Ready for xray

## 2011-12-26 NOTE — ED Notes (Signed)
Pt states she has been constipated and took 3 dulcolax, now c/o vomiting and bloody bm's.

## 2011-12-26 NOTE — ED Provider Notes (Addendum)
History     CSN: [id number]  Arrival date & time 12/26/11  0551   First MD Initiated Contact with Patient 12/26/11 0601      Chief Complaint  Patient presents with  . Emesis  . Rectal Bleeding    (Consider location/radiation/quality/duration/timing/severity/associated sxs/prior treatment) HPI Debbie [name] is a 36 y.o. female who presents to the Emergency Department complaining of  Nausea, vomiting and blood in the stool. Patient was seen in the ER on 12/20/2011 with nausea, vomiting, and constipation. She was given anti-emetic, analgesics, and an enema but she was unable to retain. She has used MiraLax at home, with no results. Her primary care physician recommended Dulcolax x3 which she took last night. She developed nausea with vomiting that began this morning. She has had 2 mucus and blood-tinged stools this morning. She denies fever, chills, chest pain, shortness of breath.  PCP Dr. Rubin Payor Past Medical History  Diagnosis Date  . GERD (gastroesophageal reflux disease)   . Asthma   . Migraines   . IBS (irritable bowel syndrome)     Past Surgical History  Procedure Date  . Cesarean section   . Abdominal hysterectomy     History reviewed. No pertinent family history.  History  Substance Use Topics  . Smoking status: Current Everyday Smoker  . Smokeless tobacco: Not on file  . Alcohol Use: No    OB History    Grav Para Term Preterm Abortions TAB SAB Ect Mult Living                  Review of Systems 10 Systems reviewed and are negative for acute change except as noted in the HPI. Allergies  Metronidazole and Advil  Home Medications   Current Outpatient Rx  Name Route Sig Dispense Refill  . ALBUTEROL SULFATE HFA 108 (90 BASE) MCG/ACT IN AERS Inhalation Inhale 1-2 puffs into the lungs every 6 (six) hours as needed for wheezing. 1 Inhaler 0  . BC HEADACHE POWDER PO Oral Take 1 packet by mouth every 4 (four) hours as needed. For headache    . OMEPRAZOLE 20  MG PO CPDR Oral Take 20 mg by mouth daily.      Marland Kitchen PROMETHAZINE HCL 25 MG RE SUPP  Unwrap and insert 1 PR PRN nausea or vomiting 12 each 0    BP 161/96  Pulse 64  Temp(Src) 97.8 F (36.6 C) (Oral)  Resp 18  Ht 5\' 6"  (1.676 m)  Wt 183 lb (83.008 kg)  BMI 29.54 kg/m2  SpO2 99%  Physical Exam  Nursing note and vitals reviewed. Constitutional: She is oriented to person, place, and time. She appears well-developed and well-nourished.  HENT:  Head: Normocephalic.  Right Ear: External ear normal.  Left Ear: External ear normal.  Nose: Nose normal.  Mouth/Throat: Oropharynx is clear and moist.  Eyes: EOM are normal. Pupils are equal, round, and reactive to light.  Neck: Normal range of motion. Neck supple.  Cardiovascular: Normal rate, normal heart sounds and intact distal pulses.   Pulmonary/Chest: Effort normal and breath sounds normal.  Abdominal: Soft. Bowel sounds are normal.  Genitourinary:       Stool brown, hemocult trace positive  Musculoskeletal: Normal range of motion.  Neurological: She is alert and oriented to person, place, and time. She has normal reflexes.  Skin: Skin is warm and dry.    ED Course  Procedures (including critical care time) Dg Abd Acute W/chest  12/26/2011  *RADIOLOGY REPORT*  Clinical Data:  Abdominal pain with bright red blood per rectum.  ACUTE ABDOMEN SERIES (ABDOMEN 2 VIEW & CHEST 1 VIEW)  Comparison: 12/20/2011 radiographs and CT 01/25/2011.  Findings: The heart size and mediastinal contours are normal. The lungs are clear. There is no pleural effusion or pneumothorax. No acute osseous findings are identified.  Paucity of bowel gas is again noted.  There is no evidence of bowel obstruction or pneumoperitoneum.  Left pelvic surgical clips are stable.  There are no suspicious calcifications.  IMPRESSION: Stable examination.  No acute cardiopulmonary or abdominal process identified.  Original Report Authenticated By: Gerrianne Scale, M.D.   New  Prescriptions   PROMETHAZINE (PHENERGAN) 25 MG TABLET    Take 0.5 tablets (12.5 mg total) by mouth every 6 (six) hours as needed for nausea.    MDM  Patient with history of constipation here with nausea, vomiting, stools mixed with blood and mucus.Given IVF, analgesic, antiemetic with relief. Acute abdominal series with no acute findings. Continued large stool burden.Pt stable in ED with no significant deterioration in condition.The patient appears reasonably screened and/or stabilized for discharge and I doubt any other medical condition or other [facility] requiring further screening, evaluation, or treatment in the ED at this time prior to discharge.  MDM Reviewed: nursing note and vitals Interpretation: x-ray           Nicoletta Dress. Colon Branch, MD 12/26/11 [id number]  Nicoletta Dress. Colon Branch, MD 01/19/12 [phone]

## 2012-04-18 ENCOUNTER — Other Ambulatory Visit: Payer: Self-pay | Admitting: Adult Health

## 2012-04-18 DIAGNOSIS — N63 Unspecified lump in unspecified breast: Secondary | ICD-10-CM

## 2012-04-20 ENCOUNTER — Encounter: Payer: Self-pay | Admitting: Gastroenterology

## 2012-04-27 ENCOUNTER — Ambulatory Visit (HOSPITAL_COMMUNITY)
Admission: RE | Admit: 2012-04-27 | Discharge: 2012-04-27 | Disposition: A | Discharge status: Home / Self Care | Payer: Medicaid Other | Source: Ambulatory Visit | Attending: Adult Health | Admitting: Adult Health

## 2012-04-27 ENCOUNTER — Other Ambulatory Visit: Payer: Self-pay | Admitting: Adult Health

## 2012-04-27 DIAGNOSIS — R928 Other abnormal and inconclusive findings on diagnostic imaging of breast: Secondary | ICD-10-CM | POA: Insufficient documentation

## 2012-04-27 DIAGNOSIS — N63 Unspecified lump in unspecified breast: Secondary | ICD-10-CM

## 2012-05-02 ENCOUNTER — Encounter (HOSPITAL_COMMUNITY): Payer: Self-pay | Admitting: *Deleted

## 2012-05-02 ENCOUNTER — Emergency Department (HOSPITAL_COMMUNITY)
Admission: EM | Admit: 2012-05-02 | Discharge: 2012-05-02 | Disposition: A | Discharge status: Home / Self Care | Payer: Medicaid Other | Attending: Emergency Medicine | Admitting: Emergency Medicine

## 2012-05-02 DIAGNOSIS — Z0389 Encounter for observation for other suspected diseases and conditions ruled out: Secondary | ICD-10-CM | POA: Insufficient documentation

## 2012-05-02 NOTE — ED Notes (Signed)
Low abd and back pain with radiation down lt leg. Onset yesterday,, Nausea, no vomiting, no diarrhea.

## 2012-05-03 ENCOUNTER — Ambulatory Visit: Payer: Medicaid Other | Admitting: Gastroenterology

## 2012-05-03 ENCOUNTER — Encounter (HOSPITAL_COMMUNITY): Payer: Self-pay | Admitting: *Deleted

## 2012-05-03 ENCOUNTER — Emergency Department (HOSPITAL_COMMUNITY)
Admission: EM | Admit: 2012-05-03 | Discharge: 2012-05-03 | Disposition: A | Discharge status: Home / Self Care | Payer: Medicaid Other | Source: Home / Self Care | Attending: Emergency Medicine | Admitting: Emergency Medicine

## 2012-05-03 ENCOUNTER — Emergency Department (HOSPITAL_COMMUNITY)
Admission: EM | Admit: 2012-05-03 | Discharge: 2012-05-03 | Disposition: A | Discharge status: Home / Self Care | Payer: Medicaid Other | Attending: Emergency Medicine | Admitting: Emergency Medicine

## 2012-05-03 ENCOUNTER — Encounter (HOSPITAL_COMMUNITY): Payer: Self-pay | Admitting: Emergency Medicine

## 2012-05-03 DIAGNOSIS — M549 Dorsalgia, unspecified: Secondary | ICD-10-CM | POA: Insufficient documentation

## 2012-05-03 DIAGNOSIS — K219 Gastro-esophageal reflux disease without esophagitis: Secondary | ICD-10-CM | POA: Insufficient documentation

## 2012-05-03 DIAGNOSIS — M545 Low back pain, unspecified: Secondary | ICD-10-CM | POA: Insufficient documentation

## 2012-05-03 DIAGNOSIS — F17200 Nicotine dependence, unspecified, uncomplicated: Secondary | ICD-10-CM | POA: Insufficient documentation

## 2012-05-03 DIAGNOSIS — K589 Irritable bowel syndrome without diarrhea: Secondary | ICD-10-CM | POA: Insufficient documentation

## 2012-05-03 DIAGNOSIS — M533 Sacrococcygeal disorders, not elsewhere classified: Secondary | ICD-10-CM

## 2012-05-03 DIAGNOSIS — J45909 Unspecified asthma, uncomplicated: Secondary | ICD-10-CM | POA: Insufficient documentation

## 2012-05-03 MED ORDER — CYCLOBENZAPRINE HCL 10 MG PO TABS
10.0000 mg | ORAL_TABLET | Freq: Once | ORAL | Status: AC
  Administered 2012-05-03: 10 mg via ORAL
  Filled 2012-05-03: qty 1

## 2012-05-03 MED ORDER — CYCLOBENZAPRINE HCL 10 MG PO TABS: ORAL_TABLET | ORAL | Status: DC

## 2012-05-03 MED ORDER — OXYCODONE-ACETAMINOPHEN 5-325 MG PO TABS: 1.0000 | ORAL_TABLET | Freq: Four times a day (QID) | ORAL | Status: DC | PRN

## 2012-05-03 MED ORDER — IBUPROFEN 800 MG PO TABS
800.0000 mg | ORAL_TABLET | Freq: Once | ORAL | Status: AC
  Administered 2012-05-03: 800 mg via ORAL
  Filled 2012-05-03: qty 1

## 2012-05-03 NOTE — ED Provider Notes (Signed)
History     CSN: [id number]  Arrival date & time 05/03/12  4098   First MD Initiated Contact with Patient 05/03/12 [phone]      Chief Complaint  Patient presents with  . Back Pain    (Consider location/radiation/quality/duration/timing/severity/associated sxs/prior treatment) HPI Comments: States she awakened 2 days ago with low back pain.  Worse with movement.  No known injury.  "i thought it was my IBS.  No abdominal pain.  No melena or hematochezia.  No fever or chills.  Has taken several doses of aspirin with no relief.  No radiation.  No h/o back  Problems.  Patient is a 36 y.o. female presenting with back pain. The history is provided by the patient. No language interpreter was used.  Back Pain  This is a new problem. The current episode started 2 days ago. The problem occurs constantly. The problem has not changed since onset.The pain is associated with no known injury. The pain is present in the lumbar spine. The quality of the pain is described as aching. The pain is moderate. The pain is the same all the time. Pertinent negatives include no fever, no bowel incontinence, no perianal numbness, no dysuria, no pelvic pain, no paresthesias, no paresis, no tingling and no weakness.    Past Medical History  Diagnosis Date  . GERD (gastroesophageal reflux disease)   . Asthma   . Migraines   . IBS (irritable bowel syndrome)     Past Surgical History  Procedure Date  . Cesarean section   . Abdominal hysterectomy     Family History  Problem Relation Age of Onset  . Cancer Mother   . Stroke Mother   . Cancer Father   . Stroke Father     History  Substance Use Topics  . Smoking status: Current Everyday Smoker -- 0.0 packs/day    Types: Cigarettes  . Smokeless tobacco: Not on file  . Alcohol Use: No    OB History    Grav Para Term Preterm Abortions TAB SAB Ect Mult Living                  Review of Systems  Constitutional: Negative for fever.  Gastrointestinal:  Negative for bowel incontinence.  Genitourinary: Negative for dysuria and pelvic pain.  Musculoskeletal: Positive for back pain.  Neurological: Negative for tingling, weakness and paresthesias.  All other systems reviewed and are negative.    Allergies  Metronidazole and Advil  Home Medications   Current Outpatient Rx  Name Route Sig Dispense Refill  . ALBUTEROL SULFATE HFA 108 (90 BASE) MCG/ACT IN AERS Inhalation Inhale 1-2 puffs into the lungs every 6 (six) hours as needed. For shortness of breath    . BC HEADACHE POWDER PO Oral Take 1 packet by mouth every 4 (four) hours as needed. For headache    . CYCLOBENZAPRINE HCL 10 MG PO TABS Oral Take 10 mg by mouth once.    . IBUPROFEN 800 MG PO TABS Oral Take 800 mg by mouth once.    . OMEPRAZOLE 40 MG PO CPDR Oral Take 40 mg by mouth daily.    . OXYCODONE-ACETAMINOPHEN 5-325 MG PO TABS Oral Take 1-2 tablets by mouth every 6 (six) hours as needed for pain. 20 tablet 0    BP 154/100  Pulse 64  Temp(Src) 97.8 F (36.6 C) (Oral)  Resp 16  Ht 5\' 6"  (1.676 m)  Wt 178 lb (80.74 kg)  BMI 28.73 kg/m2  SpO2 97%  Physical  Exam  Nursing note and vitals reviewed. Constitutional: She is oriented to person, place, and time. She appears well-developed and well-nourished. No distress.  HENT:  Head: Normocephalic and atraumatic.  Eyes: EOM are normal.  Neck: Normal range of motion.  Cardiovascular: Normal rate, regular rhythm and normal heart sounds.   Pulmonary/Chest: Effort normal and breath sounds normal.  Abdominal: Soft. She exhibits no distension. There is no tenderness.  Musculoskeletal:       Back:  Neurological: She is alert and oriented to person, place, and time. She has normal strength. No cranial nerve deficit or sensory deficit. Coordination and gait normal. GCS eye subscore is 4. GCS verbal subscore is 5. GCS motor subscore is 6.  Reflex Scores:      Patellar reflexes are 2+ on the right side and 2+ on the left side.       Achilles reflexes are 2+ on the right side and 2+ on the left side. Skin: Skin is warm and dry.  Psychiatric: She has a normal mood and affect. Judgment normal.    ED Course  Procedures (including critical care time)  Labs Reviewed - No data to display No results found.   1. Sacral back pain       MDM  Exam and history c/w musculoskeletal back pain.  Has appt with PCP in [place] [facility] tomorrow AM.   Ice, ibuprofen with food.  rx-flexeril 10 mg, TID, 20        Worthy Rancher, [place] 05/04/12 1704

## 2012-05-03 NOTE — Discharge Instructions (Signed)
Cryotherapy Cryotherapy means treatment with cold. Ice or gel packs can be used to reduce both pain and swelling. Ice is the most helpful within the first 24 to 48 hours after an injury or flareup from overusing a muscle or joint. Sprains, strains, spasms, burning pain, shooting pain, and aches can all be eased with ice. Ice can also be used when recovering from surgery. Ice is effective, has very few side effects, and is safe for most people to use. PRECAUTIONS  Ice is not a safe treatment option for people with:  Raynaud's phenomenon. This is a condition affecting small blood vessels in the extremities. Exposure to cold may cause your problems to return.   Cold hypersensitivity. There are many forms of cold hypersensitivity, including:   Cold urticaria. Red, itchy hives appear on the skin when the tissues begin to warm after being iced.   Cold erythema. This is a red, itchy rash caused by exposure to cold.   Cold hemoglobinuria. Red blood cells break down when the tissues begin to warm after being iced. The hemoglobin that carry oxygen are passed into the urine because they cannot combine with blood proteins fast enough.   Numbness or altered sensitivity in the area being iced.  If you have any of the following conditions, do not use ice until you have discussed cryotherapy with your caregiver:  Heart conditions, such as arrhythmia, angina, or chronic heart disease.   High blood pressure.   Healing wounds or open skin in the area being iced.   Current infections.   Rheumatoid arthritis.   Poor circulation.   Diabetes.  Ice slows the blood flow in the region it is applied. This is beneficial when trying to stop inflamed tissues from spreading irritating chemicals to surrounding tissues. However, if you expose your skin to cold temperatures for too long or without the proper protection, you can damage your skin or nerves. Watch for signs of skin damage due to cold. HOME CARE  INSTRUCTIONS Follow these tips to use ice and cold packs safely.  Place a dry or damp towel between the ice and skin. A damp towel will cool the skin more quickly, so you may need to shorten the time that the ice is used.   For a more rapid response, add gentle compression to the ice.   Ice for no more than 10 to 20 minutes at a time. The bonier the area you are icing, the less time it will take to get the benefits of ice.   Check your skin after 5 minutes to make sure there are no signs of a poor response to cold or skin damage.   Rest 20 minutes or more in between uses.   Once your skin is numb, you can end your treatment. You can test numbness by very lightly touching your skin. The touch should be so light that you do not see the skin dimple from the pressure of your fingertip. When using ice, most people will feel these normal sensations in this order: cold, burning, aching, and numbness.   Do not use ice on someone who cannot communicate their responses to pain, such as small children or people with dementia.  HOW TO MAKE AN ICE PACK Ice packs are the most common way to use ice therapy. Other methods include ice massage, ice baths, and cryo-sprays. Muscle creams that cause a cold, tingly feeling do not offer the same benefits that ice offers and should not be used as a substitute  unless recommended by your caregiver. To make an ice pack, do one of the following:  Place crushed ice or a bag of frozen vegetables in a sealable plastic bag. Squeeze out the excess air. Place this bag inside another plastic bag. Slide the bag into a pillowcase or place a damp towel between your skin and the bag.   Mix 3 parts water with 1 part rubbing alcohol. Freeze the mixture in a sealable plastic bag. When you remove the mixture from the freezer, it will be slushy. Squeeze out the excess air. Place this bag inside another plastic bag. Slide the bag into a pillowcase or place a damp towel between your skin  and the bag.  SEEK MEDICAL CARE IF:  You develop white spots on your skin. This may give the skin a blotchy (mottled) appearance.   Your skin turns blue or pale.   Your skin becomes waxy or hard.   Your swelling gets worse.  MAKE SURE YOU:   Understand these instructions.   Will watch your condition.   Will get help right away if you are not doing well or get worse.  Document Released: 07/13/2011 Document Revised: 11/05/2011 Document Reviewed: 07/13/2011 [facility] Patient Information 2012 [place], [place].    Take flexeril as directed.  Take ibuprofen  800 mg every 8 hrs with food.  Apply ice 20-30 min several times daily.  Follow up with your MD tomorrow as planned.

## 2012-05-03 NOTE — ED Notes (Signed)
Pt c/o lower back pain radiating down her left leg since Sunday. Denies urinary symptoms. Worse with movement.

## 2012-05-03 NOTE — ED Provider Notes (Signed)
History  Scribed for [employer]. Bernette Mayers, MD, the patient was seen in room STRE3/STRE3. This chart was scribed by Candelaria Stagers. The patient's care started at 2:25 PM    CSN: [id number]  Arrival date & time 05/03/12  1339   First MD Initiated Contact with Patient 05/03/12 1416      Chief Complaint  Patient presents with  . Back Pain     HPI Debbie [name] is a 36 y.o. female who presents to the Emergency Department complaining of lower back pain that goes around to her abdomen.  She states that she is also experiencing pain down her right leg.  She denies injury and states that twisting makes the pain worse.  Pt was seen in [facility] ED this morning and the pain medication given did not help.    Past Medical History  Diagnosis Date  . GERD (gastroesophageal reflux disease)   . Asthma   . Migraines   . IBS (irritable bowel syndrome)     Past Surgical History  Procedure Date  . Cesarean section   . Abdominal hysterectomy     Family History  Problem Relation Age of Onset  . Cancer Mother   . Stroke Mother   . Cancer Father   . Stroke Father     History  Substance Use Topics  . Smoking status: Current Everyday Smoker -- 0.0 packs/day    Types: Cigarettes  . Smokeless tobacco: Not on file  . Alcohol Use: No    OB History    Grav Para Term Preterm Abortions TAB SAB Ect Mult Living                  Review of Systems  Gastrointestinal: Positive for abdominal pain.  Musculoskeletal: Positive for back pain (lower back ).  All other systems reviewed and are negative.    Allergies  Metronidazole and Advil  Home Medications   Current Outpatient Rx  Name Route Sig Dispense Refill  . ALBUTEROL SULFATE HFA 108 (90 BASE) MCG/ACT IN AERS Inhalation Inhale 1-2 puffs into the lungs every 6 (six) hours as needed. For shortness of breath    . BC HEADACHE POWDER PO Oral Take 1 packet by mouth every 4 (four) hours as needed. For headache    . CYCLOBENZAPRINE HCL 10 MG  PO TABS Oral Take 10 mg by mouth once.    . IBUPROFEN 800 MG PO TABS Oral Take 800 mg by mouth once.    . OMEPRAZOLE 40 MG PO CPDR Oral Take 40 mg by mouth daily.      BP 137/91  Pulse 66  Temp(Src) 98.3 F (36.8 C) (Oral)  Resp 16  SpO2 98%  Physical Exam  Nursing note and vitals reviewed. Constitutional: She is oriented to person, place, and time. She appears well-developed and well-nourished. No distress.  HENT:  Head: Normocephalic and atraumatic.  Eyes: EOM are normal. Pupils are equal, round, and reactive to light.  Neck: Neck supple. No tracheal deviation present.  Cardiovascular: Normal rate.   Pulmonary/Chest: Effort normal. No respiratory distress.  Abdominal: Soft. She exhibits no distension.  Musculoskeletal: Normal range of motion. She exhibits no edema.       Tenderness across lower back on palpation.  Reflexes normal.  Normal strength and straight leg test.   Neurological: She is alert and oriented to person, place, and time. No sensory deficit.  Skin: Skin is warm and dry.  Psychiatric: She has a normal mood and affect. Her behavior  is normal.    ED Course  Procedures  DIAGNOSTIC STUDIES: Oxygen Saturation is 98% on room air, normal by my interpretation.    COORDINATION OF CARE:     Labs Reviewed - No data to display No results found.   1. Back pain       MDM  Pt has several days of atraumatic back pain, aching down L leg. Seen at [facility] earlier today and given Rx for Flexeril. Asking for something stronger. Has PCP appt tomorrow.   I personally performed the services described in the documentation, which were scribed in my presence. The recorded information has been reviewed and considered.            Dorie Ohms B. Bernette Mayers, MD 05/03/12 1429

## 2012-05-03 NOTE — ED Notes (Signed)
Onset 2 days ago lower back pain radiating to left buttocks. States history of back pain.  Pain currently 10/10 achy sharp. Seen at another hospital earlier today.

## 2012-05-05 ENCOUNTER — Encounter (HOSPITAL_COMMUNITY): Payer: Self-pay | Admitting: Emergency Medicine

## 2012-05-05 ENCOUNTER — Emergency Department (HOSPITAL_COMMUNITY): Payer: Medicaid Other

## 2012-05-05 ENCOUNTER — Emergency Department (HOSPITAL_COMMUNITY)
Admission: EM | Admit: 2012-05-05 | Discharge: 2012-05-05 | Disposition: A | Discharge status: Home / Self Care | Payer: Medicaid Other | Attending: Emergency Medicine | Admitting: Emergency Medicine

## 2012-05-05 DIAGNOSIS — M549 Dorsalgia, unspecified: Secondary | ICD-10-CM | POA: Insufficient documentation

## 2012-05-05 DIAGNOSIS — G8929 Other chronic pain: Secondary | ICD-10-CM | POA: Insufficient documentation

## 2012-05-05 DIAGNOSIS — R32 Unspecified urinary incontinence: Secondary | ICD-10-CM

## 2012-05-05 DIAGNOSIS — K589 Irritable bowel syndrome without diarrhea: Secondary | ICD-10-CM | POA: Insufficient documentation

## 2012-05-05 DIAGNOSIS — Z79899 Other long term (current) drug therapy: Secondary | ICD-10-CM | POA: Insufficient documentation

## 2012-05-05 LAB — URINALYSIS, ROUTINE W REFLEX MICROSCOPIC
Bilirubin Urine: NEGATIVE
Glucose, UA: NEGATIVE mg/dL
Hgb urine dipstick: NEGATIVE
Ketones, ur: NEGATIVE mg/dL
Leukocytes, UA: NEGATIVE
Nitrite: NEGATIVE
Protein, ur: NEGATIVE mg/dL
Specific Gravity, Urine: 1.005 (ref 1.005–1.030)
Urobilinogen, UA: 0.2 mg/dL (ref 0.0–1.0)
pH: 6 (ref 5.0–8.0)

## 2012-05-05 MED ORDER — HYDROMORPHONE HCL PF 1 MG/ML IJ SOLN
1.0000 mg | Freq: Once | INTRAMUSCULAR | Status: AC
  Administered 2012-05-05: 1 mg via INTRAMUSCULAR
  Filled 2012-05-05: qty 1

## 2012-05-05 MED ORDER — ONDANSETRON 4 MG PO TBDP
4.0000 mg | ORAL_TABLET | Freq: Once | ORAL | Status: AC
  Administered 2012-05-05: 4 mg via ORAL
  Filled 2012-05-05: qty 1

## 2012-05-05 MED ORDER — OXYCODONE-ACETAMINOPHEN 5-325 MG PO TABS: ORAL_TABLET | ORAL | Status: DC

## 2012-05-05 MED ORDER — PREDNISONE 50 MG PO TABS: 50.0000 mg | ORAL_TABLET | Freq: Every day | ORAL | Status: AC

## 2012-05-05 MED ORDER — METHYLPREDNISOLONE SODIUM SUCC 125 MG IJ SOLR
125.0000 mg | Freq: Once | INTRAMUSCULAR | Status: AC
  Administered 2012-05-05: 125 mg via INTRAMUSCULAR
  Filled 2012-05-05: qty 2

## 2012-05-05 NOTE — ED Notes (Signed)
Pt here for continued lower back and leg pain

## 2012-05-05 NOTE — Discharge Instructions (Signed)
Back Pain, Adult Low back pain is very common. About 1 in 5 people have back pain.The cause of low back pain is rarely dangerous. The pain often gets better over time.About half of people with a sudden onset of back pain feel better in just 2 weeks. About 8 in 10 people feel better by 6 weeks.  CAUSES Some common causes of back pain include:  Strain of the muscles or ligaments supporting the spine.   Wear and tear (degeneration) of the spinal discs.   Arthritis.   Direct injury to the back.  DIAGNOSIS Most of the time, the direct cause of low back pain is not known.However, back pain can be treated effectively even when the exact cause of the pain is unknown.Answering your caregiver's questions about your overall health and symptoms is one of the most accurate ways to make sure the cause of your pain is not dangerous. If your caregiver needs more information, he or she may order lab work or imaging tests (X-rays or MRIs).However, even if imaging tests show changes in your back, this usually does not require surgery. HOME CARE INSTRUCTIONS For many people, back pain returns.Since low back pain is rarely dangerous, it is often a condition that people can learn to manageon their own.   Remain active. It is stressful on the back to sit or stand in one place. Do not sit, drive, or stand in one place for more than 30 minutes at a time. Take short walks on level surfaces as soon as pain allows.Try to increase the length of time you walk each day.   Do not stay in bed.Resting more than 1 or 2 days can delay your recovery.   Do not avoid exercise or work.Your body is made to move.It is not dangerous to be active, even though your back may hurt.Your back will likely heal faster if you return to being active before your pain is gone.   Pay attention to your body when you bend and lift. Many people have less discomfortwhen lifting if they bend their knees, keep the load close to their  bodies,and avoid twisting. Often, the most comfortable positions are those that put less stress on your recovering back.   Find a comfortable position to sleep. Use a firm mattress and lie on your side with your knees slightly bent. If you lie on your back, put a pillow under your knees.   Only take over-the-counter or prescription medicines as directed by your caregiver. Over-the-counter medicines to reduce pain and inflammation are often the most helpful.Your caregiver may prescribe muscle relaxant drugs.These medicines help dull your pain so you can more quickly return to your normal activities and healthy exercise.   Put ice on the injured area.   Put ice in a plastic bag.   Place a towel between your skin and the bag.   Leave the ice on for 15 to 20 minutes, 3 to 4 times a day for the first 2 to 3 days. After that, ice and heat may be alternated to reduce pain and spasms.   Ask your caregiver about trying back exercises and gentle massage. This may be of some benefit.   Avoid feeling anxious or stressed.Stress increases muscle tension and can worsen back pain.It is important to recognize when you are anxious or stressed and learn ways to manage it.Exercise is a great option.  SEEK MEDICAL CARE IF:  You have pain that is not relieved with rest or medicine.   You have   pain that does not improve in 1 week.   You have new symptoms.   You are generally not feeling well.  SEEK IMMEDIATE MEDICAL CARE IF:   You have pain that radiates from your back into your legs.   You develop new bowel or bladder control problems.   You have unusual weakness or numbness in your arms or legs.   You develop nausea or vomiting.   You develop abdominal pain.   You feel faint.  Document Released: 11/16/2005 Document Revised: 11/05/2011 Document Reviewed: 04/06/2011 [facility] Patient Information 2012 [place], [place].   There is nothing noted on the MRI that would account for your 2 episodes  of urinary incontinence.  Take the meds as directed.  Apply ice several times daily to lower back.  If your pain cotinues follow up with your MD.  If necessary they can refer you to a neurosurgeon.

## 2012-05-05 NOTE — ED Provider Notes (Signed)
Medical screening examination/treatment/procedure(s) were performed by non-physician practitioner and as supervising physician I was immediately available for consultation/collaboration.  Doug Sou, MD 05/05/12 [phone]

## 2012-05-05 NOTE — ED Provider Notes (Addendum)
History     CSN: [id number]  Arrival date & time 05/05/12  1034   None     Chief Complaint  Patient presents with  . Back Pain    (Consider location/radiation/quality/duration/timing/severity/associated sxs/prior treatment) HPI Comments: saw her PCP this AM at [facility].  Told to come to ED for further evaluation.  Patient is a 36 y.o. female presenting with back pain. The history is provided by the patient. No language interpreter was used.  Back Pain  This is a new problem. Episode onset: has chronic low back pain with acute exacerbation.  2 episodes of urinary incontinence this AM.  no saddle dysthesia.  no extremity dysthesia  no bowel incontinence. The problem occurs constantly. The pain is associated with no known injury. The pain is present in the lumbar spine. The quality of the pain is described as aching. The pain does not radiate. The pain is severe. The symptoms are aggravated by bending, twisting and certain positions. The pain is the same all the time. Associated symptoms include bladder incontinence. Pertinent negatives include no fever, no bowel incontinence, no perianal numbness, no dysuria, no pelvic pain, no leg pain, no paresthesias, no paresis, no tingling and no weakness. She has tried muscle relaxants for the symptoms.    Past Medical History  Diagnosis Date  . GERD (gastroesophageal reflux disease)   . Asthma   . Migraines   . IBS (irritable bowel syndrome)     Past Surgical History  Procedure Date  . Cesarean section   . Abdominal hysterectomy     Family History  Problem Relation Age of Onset  . Cancer Mother   . Stroke Mother   . Cancer Father   . Stroke Father     History  Substance Use Topics  . Smoking status: Current Everyday Smoker -- 0.0 packs/day    Types: Cigarettes  . Smokeless tobacco: Not on file  . Alcohol Use: No    OB History    Grav Para Term Preterm Abortions TAB SAB Ect Mult Living                  Review of  Systems  Constitutional: Negative for fever and chills.  Gastrointestinal: Negative for bowel incontinence.  Genitourinary: Positive for bladder incontinence. Negative for dysuria, urgency, frequency, hematuria and pelvic pain.  Musculoskeletal: Positive for back pain.  Neurological: Negative for tingling, weakness and paresthesias.  All other systems reviewed and are negative.    Allergies  Metronidazole and Advil  Home Medications   Current Outpatient Rx  Name Route Sig Dispense Refill  . ALBUTEROL SULFATE HFA 108 (90 BASE) MCG/ACT IN AERS Inhalation Inhale 1-2 puffs into the lungs every 6 (six) hours as needed. For shortness of breath    . BC HEADACHE POWDER PO Oral Take 1 packet by mouth daily as needed. For headache    . CYCLOBENZAPRINE HCL 10 MG PO TABS Oral Take 5-10 mg by mouth 3 (three) times daily as needed. For pain    . IBUPROFEN 200 MG PO TABS Oral Take 800 mg by mouth as needed. For pain    . OMEPRAZOLE 40 MG PO CPDR Oral Take 40 mg by mouth daily.    . OXYCODONE-ACETAMINOPHEN 5-325 MG PO TABS Oral Take 1-2 tablets by mouth every 6 (six) hours as needed for pain. 20 tablet 0  . OXYCODONE-ACETAMINOPHEN 5-325 MG PO TABS  One tab po q 4-6 hrs prn pain 15 tablet 0  . PREDNISONE 50 MG  PO TABS Oral Take 1 tablet (50 mg total) by mouth daily. 6 tablet 0    BP 168/104  Pulse 76  Temp(Src) 98.2 F (36.8 C) (Oral)  Resp 20  Ht 5\' 6"  (1.676 m)  Wt 178 lb (80.74 kg)  BMI 28.73 kg/m2  SpO2 100%  Physical Exam  Nursing note and vitals reviewed. Constitutional: She is oriented to person, place, and time. She appears well-developed and well-nourished. No distress.  HENT:  Head: Normocephalic and atraumatic.  Eyes: EOM are normal.  Neck: Normal range of motion.  Cardiovascular: Normal rate, regular rhythm and normal heart sounds.   Pulmonary/Chest: Effort normal and breath sounds normal.  Abdominal: Soft. She exhibits no distension. There is no tenderness.    Musculoskeletal:       Lumbar back: She exhibits decreased range of motion, tenderness, bony tenderness and pain. She exhibits no swelling, no edema, no deformity, no spasm and normal pulse.       Back:  Neurological: She is alert and oriented to person, place, and time. She has normal strength. No sensory deficit. Coordination and gait normal. GCS eye subscore is 4. GCS verbal subscore is 5. GCS motor subscore is 6.  Reflex Scores:      Patellar reflexes are 2+ on the right side and 2+ on the left side.      Achilles reflexes are 2+ on the right side and 2+ on the left side. Skin: Skin is warm and dry.  Psychiatric: She has a normal mood and affect. Judgment normal.    ED Course  Procedures (including critical care time)   Labs Reviewed  URINALYSIS, ROUTINE W REFLEX MICROSCOPIC   Mr Lumbar Spine Wo Contrast  05/05/2012  *RADIOLOGY REPORT*  Clinical Data: Acute on chronic lumbar spine pain.  Two episodes of urinary incontinence this morning.  MRI LUMBAR SPINE WITHOUT CONTRAST  Technique:  Multiplanar and multiecho pulse sequences of the lumbar spine were obtained without intravenous contrast.  Comparison: 12/26/2011 plain film exam.  01/25/2011 CT abdomen pelvis.  Findings: There is a transitional vertebra.  Utilizing the prior examinations, this transitional vertebra represents the L5 vertebral body with a rudimentary disc at the L5-S1 level.  This level assignment needs to be understood by anyone involved in the patient's future care.  Present examination incorporates from T10-11 disc space through the lower sacrum.  Conus at the lower T12 level. No compression of the conus.  Visualized paravertebral structures unremarkable.  T10-11 through L1-2 unremarkable.  L2-3:  Small cyst projects posteriorly from the inferior aspect of the left facet joint. Minimal facet joint degenerative changes. Minimal bulge.  No spinal stenosis or foraminal narrowing.  L3-4:  Bilateral facet joint degenerative  changes. Mild bulge. Mild bilateral foraminal narrowing.  No compression of the exiting L3 nerve roots.  Very mild spinal stenosis.  L4-5:  Bulge with shallow central protrusion/annular tear.  Facet joint degenerative changes.  Mild ligamentum flavum hypertrophy. Very mild spinal stenosis.  Minimal bilateral foraminal narrowing.  L5-S1:  Rudimentary disc.  No spinal stenosis or foraminal narrowing.  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  Minimal to mild degenerative changes L3-4 and L4-5 without significant spinal stenosis or nerve root compression.  Please see above for further detail.  Original Report Authenticated By: Fuller Canada, M.D.     1. Back pain   2. Urinary incontinence     MRI results explained to pt.    MDM  rx- prednisone 50 mg,  6 rx-percocet, 15 ice        Worthy Rancher, [place] 05/05/12 1601  Evalina Field, PA 06/05/12 1643

## 2012-05-05 NOTE — ED Notes (Signed)
Patient states couldn't hold bladder this am. New onset incontinence

## 2012-05-05 NOTE — ED Provider Notes (Signed)
Medical screening examination/treatment/procedure(s) were performed by non-physician practitioner and as supervising physician I was immediately available for consultation/collaboration.   Laray Anger, DO 05/05/12 1655

## 2012-05-10 ENCOUNTER — Ambulatory Visit (INDEPENDENT_AMBULATORY_CARE_PROVIDER_SITE_OTHER): Payer: Medicaid Other | Admitting: Gastroenterology

## 2012-05-10 ENCOUNTER — Other Ambulatory Visit: Payer: Self-pay | Admitting: Internal Medicine

## 2012-05-10 ENCOUNTER — Encounter: Payer: Self-pay | Admitting: Gastroenterology

## 2012-05-10 VITALS — BP 140/97 | HR 81 | Temp 98.2°F | Ht 66.0 in | Wt 178.4 lb

## 2012-05-10 DIAGNOSIS — R1310 Dysphagia, unspecified: Secondary | ICD-10-CM

## 2012-05-10 DIAGNOSIS — K219 Gastro-esophageal reflux disease without esophagitis: Secondary | ICD-10-CM

## 2012-05-10 DIAGNOSIS — R1319 Other dysphagia: Secondary | ICD-10-CM

## 2012-05-10 DIAGNOSIS — R1013 Epigastric pain: Secondary | ICD-10-CM

## 2012-05-10 DIAGNOSIS — K5900 Constipation, unspecified: Secondary | ICD-10-CM

## 2012-05-10 DIAGNOSIS — R1314 Dysphagia, pharyngoesophageal phase: Secondary | ICD-10-CM

## 2012-05-10 DIAGNOSIS — R198 Other specified symptoms and signs involving the digestive system and abdomen: Secondary | ICD-10-CM | POA: Insufficient documentation

## 2012-05-10 MED ORDER — PEG 3350-KCL-NA BICARB-NACL 420 G PO SOLR: ORAL | Status: AC

## 2012-05-10 MED ORDER — LUBIPROSTONE 24 MCG PO CAPS: 24.0000 ug | ORAL_CAPSULE | Freq: Two times a day (BID) | ORAL | Status: AC

## 2012-05-10 MED ORDER — OMEPRAZOLE 20 MG PO CPDR: 20.0000 mg | DELAYED_RELEASE_CAPSULE | Freq: Two times a day (BID) | ORAL | Status: DC

## 2012-05-10 NOTE — Progress Notes (Signed)
Primary Care Physician:  Leo Grosser, MD, MD  Primary Gastroenterologist:  Roetta Sessions, MD   Chief Complaint  Patient presents with  . Dysphagia  . Gastrophageal Reflux    HPI:  Debbie [name] is a 36 y.o. female here with several month h/o solid food and pill dysphagia. Lot of heartburn. Increased her prilosec to 40mg  every am and doesn't control nighttime symptoms. Protonix before was okay. No prior EGD/TCS. Bad contstipation over past one year. Used to have lots of diarrhea but now can go up to 10 days without BM. No melena. No rectal bleeding. Failed miralax without relief. Once took whole bottle of mag citrate and no BM. Last [facility] June 2nd. Epigastric and lower abd pain. Worse with meals. Not on chronic pain medications. BC powders every four hours for migraines. Rarely takes ibuprofen.   On percocet and prednisone for back pain, seen in ED on 05/05/12 at [facility] and twice on 05/03/12 in ED at [facility]. U/A negative. MRI as noted below. Back pain better.   Current Outpatient Prescriptions  Medication Sig Dispense Refill  . albuterol (PROVENTIL HFA;VENTOLIN HFA) 108 (90 BASE) MCG/ACT inhaler Inhale 1-2 puffs into the lungs every 6 (six) hours as needed. For shortness of breath      . Aspirin-Salicylamide-Caffeine (BC HEADACHE POWDER PO) Take 1 packet by mouth daily as needed. For headache      . cyclobenzaprine (FLEXERIL) 10 MG tablet Take 5-10 mg by mouth 3 (three) times daily as needed. For pain      . ibuprofen (ADVIL,MOTRIN) 200 MG tablet Take 800 mg by mouth as needed. For pain      . omeprazole (PRILOSEC) 20 MG capsule Take 1 capsule (20 mg total) by mouth 2 (two) times daily.      Marland Kitchen oxyCODONE-acetaminophen (PERCOCET) 5-325 MG per tablet One tab po q 4-6 hrs prn pain  15 tablet  0  . predniSONE (DELTASONE) 50 MG tablet Take 1 tablet (50 mg total) by mouth daily.  6 tablet  0  . lubiprostone (AMITIZA) 24 MCG capsule Take 1 capsule (24 mcg total) by mouth 2 (two) times daily with a meal.   60 capsule  3  .      0    Allergies as of 05/10/2012 - Review Complete 05/10/2012  Allergen Reaction Noted  . Metronidazole Swelling 08/19/2011  . Advil (ibuprofen) Palpitations 08/19/2011    Past Medical History  Diagnosis Date  . GERD (gastroesophageal reflux disease)   . Asthma   . Migraines   . IBS (irritable bowel syndrome)     Past Surgical History  Procedure Date  . Cesarean section   . Abdominal hysterectomy     right SOO    Family History  Problem Relation Age of Onset  . Stroke Mother   . Cancer Father     ?esophageal  . Stroke Father   . Colon cancer Neg Hx   . Lung cancer Maternal Grandfather     History   Social History  . Marital Status: Single    Spouse Name: N/A    Number of Children: 3  . Years of Education: N/A   Occupational History  . unemployed    Social History Main Topics  . Smoking status: Current Everyday Smoker -- 0.0 packs/day    Types: Cigarettes  . Smokeless tobacco: Not on file   Comment: cut back to 1/3 pack per day  . Alcohol Use: No  . Drug Use: Yes    Special:  Marijuana  . Sexually Active: Yes    Birth Control/ Protection: Surgical   Other Topics Concern  . Not on file   Social History Narrative  . No narrative on file      ROS:  General: Negative for anorexia, weight loss, fever, chills, fatigue, weakness. Eyes: Negative for vision changes.  ENT: Negative for hoarseness, nasal congestion. CV: Negative for chest pain, angina, palpitations, dyspnea on exertion, peripheral edema.  Respiratory: Negative for dyspnea at rest, dyspnea on exertion, cough, sputum, wheezing.  GI: See history of present illness. GU:  Negative for dysuria, hematuria, urinary incontinence, urinary frequency, nocturnal urination.  MS: Negative for joint pain. Recent acute on chronic low back pain.  Derm: Negative for rash or itching.  Neuro: Negative for weakness, abnormal sensation, seizure, frequent headaches, memory loss, confusion.    Psych: Negative for anxiety, depression, suicidal ideation, hallucinations.  Endo: Negative for unusual weight change.  Heme: Negative for bruising or bleeding. Allergy: Negative for rash or hives.    Physical Examination:  BP 140/97  Pulse 81  Temp(Src) 98.2 F (36.8 C) (Temporal)  Ht 5\' 6"  (1.676 m)  Wt 178 lb 6.4 oz (80.922 kg)  BMI 28.79 kg/m2   General: Well-nourished, well-developed in no acute distress.  Head: Normocephalic, atraumatic.   Eyes: Conjunctiva pink, no icterus. Mouth: Oropharyngeal mucosa moist and pink , no lesions erythema or exudate. Neck: Supple without thyromegaly, masses, or lymphadenopathy.  Lungs: Clear to auscultation bilaterally.  Heart: Regular rate and rhythm, no murmurs rubs or gallops.  Abdomen: Bowel sounds are normal, mild epigastric tenderness, mild diffuse lower abd tenderness, nondistended, no hepatosplenomegaly or masses, no abdominal bruits or    hernia , no rebound or guarding.   Rectal: defer Extremities: No lower extremity edema. No clubbing or deformities.  Neuro: Alert and oriented x 4 , grossly normal neurologically.  Skin: Warm and dry, no rash or jaundice.   Psych: Alert and cooperative, normal mood and affect.     Imaging Studies: Mr Lumbar Spine Wo Contrast  05/05/2012  *RADIOLOGY REPORT*  Clinical Data: Acute on chronic lumbar spine pain.  Two episodes of urinary incontinence this morning.  MRI LUMBAR SPINE WITHOUT CONTRAST  Technique:  Multiplanar and multiecho pulse sequences of the lumbar spine were obtained without intravenous contrast.  Comparison: 12/26/2011 plain film exam.  01/25/2011 CT abdomen pelvis.  Findings: There is a transitional vertebra.  Utilizing the prior examinations, this transitional vertebra represents the L5 vertebral body with a rudimentary disc at the L5-S1 level.  This level assignment needs to be understood by anyone involved in the patient's future care.  Present examination incorporates from T10-11  disc space through the lower sacrum.  Conus at the lower T12 level. No compression of the conus.  Visualized paravertebral structures unremarkable.  T10-11 through L1-2 unremarkable.  L2-3:  Small cyst projects posteriorly from the inferior aspect of the left facet joint. Minimal facet joint degenerative changes. Minimal bulge.  No spinal stenosis or foraminal narrowing.  L3-4:  Bilateral facet joint degenerative changes. Mild bulge. Mild bilateral foraminal narrowing.  No compression of the exiting L3 nerve roots.  Very mild spinal stenosis.  L4-5:  Bulge with shallow central protrusion/annular tear.  Facet joint degenerative changes.  Mild ligamentum flavum hypertrophy. Very mild spinal stenosis.  Minimal bilateral foraminal narrowing.  L5-S1:  Rudimentary disc.  No spinal stenosis or foraminal narrowing.  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  Minimal to mild degenerative changes  L3-4 and L4-5 without significant spinal stenosis or nerve root compression.  Please see above for further detail.  Original Report Authenticated By: Fuller Canada, M.D.

## 2012-05-10 NOTE — Progress Notes (Signed)
Faxed to PCP

## 2012-05-10 NOTE — Patient Instructions (Signed)
We have scheduled you for an upper endoscopy and colonoscopy to further evaluate your abdominal pain, acid reflux, problems swallowing, and bowel changes.  Please call me if you are not having regular bowel movements prior to you start the bowel prep for your procedures. Please start Amitiza with breakfast and with your evening meal. Samples provided but RX sent to [facility] as well. Please take omeprazole (Prilosec) 20mg  before breakfast and 20mg  before your evening meal.

## 2012-05-10 NOTE — Assessment & Plan Note (Signed)
Refractory nighttime GERD, esophageal dysphagia to solids/pills, epigastric pain in setting of BC powders. EGD/ED in near future. Change Prilosec to 20mg  BID. Limit ASA powders.  I have discussed the risks, alternatives, benefits with regards to but not limited to the risk of reaction to medication, bleeding, infection, perforation and the patient is agreeable to proceed. Written consent to be obtained.

## 2012-05-10 NOTE — Assessment & Plan Note (Addendum)
Change in bowels from from predominant diarrhea now to constipation for past one year. Goes up to 10 days without BM. Increased abdominal pain with constipation. Failed Miralax per patient. Trial of amitiza po bid with food. Samples and RX provided. Colonoscopy in near future.  I have discussed the risks, alternatives, benefits with regards to but not limited to the risk of reaction to medication, bleeding, infection, perforation and the patient is agreeable to proceed. Written consent to be obtained. TSH recently checked by gyn.   Not mentioned above. Patient had CT in 2012. 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 ?enteritis at that time?

## 2012-06-06 NOTE — ED Provider Notes (Signed)
Medical screening examination/treatment/procedure(s) were performed by non-physician practitioner and as supervising physician I was immediately available for consultation/collaboration.  Doug Sou, MD 06/06/12 [phone]

## 2012-06-08 ENCOUNTER — Encounter (HOSPITAL_COMMUNITY): Admission: RE | Payer: Self-pay | Source: Ambulatory Visit

## 2012-06-08 ENCOUNTER — Telehealth: Payer: Self-pay | Admitting: Internal Medicine

## 2012-06-08 ENCOUNTER — Ambulatory Visit (HOSPITAL_COMMUNITY): Admission: RE | Admit: 2012-06-08 | Payer: Medicaid Other | Source: Ambulatory Visit | Admitting: Internal Medicine

## 2012-06-08 SURGERY — COLONOSCOPY WITH ESOPHAGOGASTRODUODENOSCOPY (EGD)
Anesthesia: Moderate Sedation

## 2012-06-08 NOTE — Telephone Encounter (Signed)
Patient was a no show for her procedure I tried to reach her I LMOM to return my call

## 2012-06-30 NOTE — Progress Notes (Signed)
REVIEWED.  

## 2012-09-02 ENCOUNTER — Encounter (HOSPITAL_COMMUNITY): Payer: Self-pay | Admitting: *Deleted

## 2012-09-02 ENCOUNTER — Emergency Department (HOSPITAL_COMMUNITY)
Admission: EM | Admit: 2012-09-02 | Discharge: 2012-09-02 | Disposition: A | Discharge status: Home / Self Care | Payer: Medicaid Other | Attending: Emergency Medicine | Admitting: Emergency Medicine

## 2012-09-02 ENCOUNTER — Emergency Department (HOSPITAL_COMMUNITY): Payer: Medicaid Other

## 2012-09-02 DIAGNOSIS — R300 Dysuria: Secondary | ICD-10-CM | POA: Insufficient documentation

## 2012-09-02 DIAGNOSIS — R61 Generalized hyperhidrosis: Secondary | ICD-10-CM | POA: Insufficient documentation

## 2012-09-02 DIAGNOSIS — K589 Irritable bowel syndrome without diarrhea: Secondary | ICD-10-CM | POA: Insufficient documentation

## 2012-09-02 DIAGNOSIS — R109 Unspecified abdominal pain: Secondary | ICD-10-CM | POA: Insufficient documentation

## 2012-09-02 DIAGNOSIS — Z79899 Other long term (current) drug therapy: Secondary | ICD-10-CM | POA: Insufficient documentation

## 2012-09-02 DIAGNOSIS — R197 Diarrhea, unspecified: Secondary | ICD-10-CM | POA: Insufficient documentation

## 2012-09-02 DIAGNOSIS — K219 Gastro-esophageal reflux disease without esophagitis: Secondary | ICD-10-CM | POA: Insufficient documentation

## 2012-09-02 DIAGNOSIS — R110 Nausea: Secondary | ICD-10-CM | POA: Insufficient documentation

## 2012-09-02 DIAGNOSIS — J45909 Unspecified asthma, uncomplicated: Secondary | ICD-10-CM | POA: Insufficient documentation

## 2012-09-02 LAB — URINALYSIS, ROUTINE W REFLEX MICROSCOPIC
Bilirubin Urine: NEGATIVE
Glucose, UA: NEGATIVE mg/dL
Hgb urine dipstick: NEGATIVE
Ketones, ur: NEGATIVE mg/dL
Leukocytes, UA: NEGATIVE
Nitrite: NEGATIVE
Protein, ur: NEGATIVE mg/dL
Specific Gravity, Urine: 1.01 (ref 1.005–1.030)
Urobilinogen, UA: 0.2 mg/dL (ref 0.0–1.0)
pH: 6 (ref 5.0–8.0)

## 2012-09-02 MED ORDER — KETOROLAC TROMETHAMINE 60 MG/2ML IM SOLN
60.0000 mg | Freq: Once | INTRAMUSCULAR | Status: AC
  Administered 2012-09-02: 60 mg via INTRAMUSCULAR
  Filled 2012-09-02: qty 2

## 2012-09-02 MED ORDER — HYDROCODONE-ACETAMINOPHEN 5-500 MG PO TABS: 1.0000 | ORAL_TABLET | Freq: Four times a day (QID) | ORAL | Status: DC | PRN

## 2012-09-02 NOTE — Discharge Instructions (Signed)
Abdominal Pain  Abdominal pain can be caused by many things. Your caregiver decides the seriousness of your pain by an examination and possibly blood tests and X-rays. Many cases can be observed and treated at home. Most abdominal pain is not caused by a disease and will probably improve without treatment. However, in many cases, more time must pass before a clear cause of the pain can be found. Before that point, it may not be known if you need more testing, or if hospitalization or surgery is needed.  HOME CARE INSTRUCTIONS   · Do not take laxatives unless directed by your caregiver.  · Take pain medicine only as directed by your caregiver.  · Only take over-the-counter or prescription medicines for pain, discomfort, or fever as directed by your caregiver.  · Try a clear liquid diet (broth, tea, or water) for as long as directed by your caregiver. Slowly move to a bland diet as tolerated.  SEEK IMMEDIATE MEDICAL CARE IF:   · The pain does not go away.  · You have a fever.  · You keep throwing up (vomiting).  · The pain is felt only in portions of the abdomen. Pain in the right side could possibly be appendicitis. In an adult, pain in the left lower portion of the abdomen could be colitis or diverticulitis.  · You pass bloody or black tarry stools.  MAKE SURE YOU:   · Understand these instructions.  · Will watch your condition.  · Will get help right away if you are not doing well or get worse.  Document Released: 08/26/2005 Document Revised: 02/08/2012 Document Reviewed: 07/04/2008  Exit[employer]® Patient Information ©2013 [employer].

## 2012-09-02 NOTE — ED Notes (Signed)
Low abd and back pain, "sweating a lot"nausea, diarrhea a few days ago

## 2012-09-02 NOTE — ED Provider Notes (Signed)
History   This chart was scribed for Debbie Lyons, MD by Debbie [name]. Debbie [name] was seen in room APA19/[facility]. [name] care was started at 2007.  CSN: [id number]  Arrival date & time 09/02/12  2007   First MD Initiated Contact with [name] 09/02/12 2018      Chief Complaint  [name] presents with  . Abdominal Pain   [name] is a 36 y.o. female presenting with abdominal pain. Debbie history is provided by Debbie [name]. No language interpreter was used.  Abdominal Pain Debbie primary symptoms of Debbie illness include abdominal pain, diarrhea and dysuria. Debbie primary symptoms of Debbie illness do not include fever, nausea, vomiting or hematemesis. Debbie current episode started 2 days ago. Debbie onset of Debbie illness was gradual. Debbie problem has been gradually worsening.  Debbie abdominal pain began 2 days ago. Debbie pain came on gradually. Debbie abdominal pain has been gradually worsening since its onset. Debbie abdominal pain is located in Debbie suprapubic region. Debbie abdominal pain radiates to Debbie back. Debbie abdominal pain is relieved by nothing. Debbie abdominal pain is exacerbated by urination.  Debbie [name] states that she believes she is currently not pregnant. Debbie [name] has not had a change in bowel habit. Additional symptoms associated with Debbie illness include diaphoresis. Symptoms associated with Debbie illness do not include heartburn or constipation. Significant associated medical issues include GERD. Associated medical issues comments: IBS.    Debbie [name] is a 36 y.o. female who presents to Debbie Emergency Department complaining of 2 days of gradual onset moderate constant suprapubic abdominal pain with associated diarrhea, dysuria, nausea, and mild diaphoresis. Pain is described as sharp, radiating to Debbie lower back, and is noted dissimilar to previous UTI. Prior to arrival symptoms have not been treated. Denies fever, hematuria, blood in stool, and constipation. Pertinent history includes partial hysterectomy (2004)  and IBS.   Past Medical History  Diagnosis Date  . GERD (gastroesophageal reflux disease)   . Asthma   . Migraines   . IBS (irritable bowel syndrome)     Past Surgical History  Procedure Date  . Cesarean section   . Abdominal hysterectomy     right SOO    Family History  Problem Relation Age of Onset  . Stroke Mother   . Cancer Father     ?esophageal  . Stroke Father   . Colon cancer Neg Hx   . Lung cancer Maternal Grandfather     History  Substance Use Topics  . Smoking status: Current Every Day Smoker -- 0.0 packs/day    Types: Cigarettes  . Smokeless tobacco: Not on file   Comment: cut back to 1/3 pack per day  . Alcohol Use: No   Review of Systems  Constitutional: Positive for diaphoresis. Negative for fever.  Gastrointestinal: Positive for abdominal pain and diarrhea. Negative for heartburn, nausea, vomiting, constipation and hematemesis.  Genitourinary: Positive for dysuria.  All other systems reviewed and are negative.    Allergies  Metronidazole and Advil  Home Medications   Current Outpatient Rx  Name Route Sig Dispense Refill  . ALBUTEROL SULFATE HFA 108 (90 BASE) MCG/ACT IN AERS Inhalation Inhale 1-2 puffs into Debbie lungs every 6 (six) hours as needed. For shortness of breath    . BC HEADACHE POWDER PO Oral Take 1 packet by mouth daily as needed. For headache    . CYCLOBENZAPRINE HCL 10 MG PO TABS Oral Take 5-10 mg by mouth 3 (three) times daily as needed. For pain    .  IBUPROFEN 200 MG PO TABS Oral Take 800 mg by mouth as needed. For pain    . OMEPRAZOLE 20 MG PO CPDR Oral Take 1 capsule (20 mg total) by mouth 2 (two) times daily.    . OXYCODONE-ACETAMINOPHEN 5-325 MG PO TABS  One tab po q 4-6 hrs prn pain 15 tablet 0    BP 158/102  Pulse 70  Temp 98.4 F (36.9 C) (Oral)  Resp 20  Ht 5\' 6"  (1.676 m)  Wt 183 lb (83.008 kg)  BMI 29.54 kg/m2  SpO2 100%  Physical Exam  Constitutional: She is oriented to person, place, and time. She appears  well-developed and well-nourished. No distress.  HENT:  Head: Normocephalic and atraumatic.  Mouth/Throat: No oropharyngeal exudate.  Eyes: EOM are normal. Pupils are equal, round, and reactive to light.  Neck: Normal range of motion. No tracheal deviation present.  Cardiovascular: Normal rate and regular rhythm.   No murmur heard. Pulmonary/Chest: Effort normal. No respiratory distress. She has no wheezes.  Abdominal: Soft. Bowel sounds are normal. There is no rebound and no guarding.       Mild suprapubic tenderness.  Musculoskeletal:       No CVA tenderness.  Neurological: She is alert and oriented to person, place, and time. Coordination normal.  Skin: Skin is warm. No rash noted.    ED Course  Procedures (including critical care time) DIAGNOSTIC STUDIES: Oxygen Saturation is 100% on room air, normal by my interpretation.    COORDINATION OF CARE: 20:20- Evaluated Pt. Pt is awake, alert, and oriented. 20:24- Ordered Urinalysis, Routine w reflex microscopic Once. 21:15- Rechecked Pt. Discussed negative findings of uranalysis.  Labs Reviewed - No data to display No results found.   No diagnosis found.    MDM  Debbie [name] presents here with pain in Debbie lower abdomen.  She was recently treated with an antibiotic for a uti but this feels different.  She has had a hysterectomy in Debbie past.  Debbie workup today does not reveal any evidence for uti, and Debbie ct is negative for renal calculus or other intra-abdominal pathology.  She will be discharged to home, to return of follow up prn.      I personally performed Debbie services described in this documentation, which was scribed in my presence. Debbie recorded information has been reviewed and considered.        Debbie Lyons, MD 09/02/12 2231

## 2012-09-13 ENCOUNTER — Emergency Department (HOSPITAL_COMMUNITY)
Admission: EM | Admit: 2012-09-13 | Discharge: 2012-09-13 | Disposition: A | Discharge status: Home / Self Care | Payer: Medicaid Other | Attending: Emergency Medicine | Admitting: Emergency Medicine

## 2012-09-13 DIAGNOSIS — F17200 Nicotine dependence, unspecified, uncomplicated: Secondary | ICD-10-CM | POA: Insufficient documentation

## 2012-09-13 DIAGNOSIS — Z888 Allergy status to other drugs, medicaments and biological substances status: Secondary | ICD-10-CM | POA: Insufficient documentation

## 2012-09-13 DIAGNOSIS — K219 Gastro-esophageal reflux disease without esophagitis: Secondary | ICD-10-CM | POA: Insufficient documentation

## 2012-09-13 DIAGNOSIS — K589 Irritable bowel syndrome without diarrhea: Secondary | ICD-10-CM | POA: Insufficient documentation

## 2012-09-13 DIAGNOSIS — R51 Headache: Secondary | ICD-10-CM

## 2012-09-13 DIAGNOSIS — J45909 Unspecified asthma, uncomplicated: Secondary | ICD-10-CM | POA: Insufficient documentation

## 2012-09-13 DIAGNOSIS — G43909 Migraine, unspecified, not intractable, without status migrainosus: Secondary | ICD-10-CM | POA: Insufficient documentation

## 2012-09-13 MED ORDER — METOCLOPRAMIDE HCL 5 MG/ML IJ SOLN
10.0000 mg | Freq: Once | INTRAMUSCULAR | Status: AC
  Administered 2012-09-13: 10 mg via INTRAVENOUS
  Filled 2012-09-13: qty 2

## 2012-09-13 MED ORDER — HYDROMORPHONE HCL PF 1 MG/ML IJ SOLN
1.0000 mg | Freq: Once | INTRAMUSCULAR | Status: AC
  Administered 2012-09-13: 1 mg via INTRAVENOUS
  Filled 2012-09-13: qty 1

## 2012-09-13 MED ORDER — DIPHENHYDRAMINE HCL 50 MG/ML IJ SOLN
25.0000 mg | Freq: Once | INTRAMUSCULAR | Status: AC
  Administered 2012-09-13: 25 mg via INTRAVENOUS
  Filled 2012-09-13: qty 1

## 2012-09-13 NOTE — Discharge Instructions (Signed)
Follow up as needed

## 2012-09-13 NOTE — ED Provider Notes (Signed)
History   This chart was scribed for Benny Lennert, MD by Gerlean Ren. This patient was seen in room APA07/[facility]07 and the patient's care was started at 21:19.   CSN: [id number]  Arrival date & time 09/13/12  0865   First MD Initiated Contact with Patient 09/13/12 2117      Chief Complaint  Patient presents with  . Migraine    (Consider location/radiation/quality/duration/timing/severity/associated sxs/prior treatment) The history is provided by the patient. No language interpreter was used.   Debbie [name] is a 36 y.o. female who presents to the Emergency Department complaining of constant, throbbing, gradually worsening non-radiating HA over entire head with gradual onset approx 12 hours ago.  Pt had one episode of associated non-bloody, non-bilious emesis that has since resolved.  Pt reports h/o similar HA at least twice per month that cause her to visit ED approx once per month, but pt is not currently taking any preventative meds.  HA sometimes improved by tylenol, but current HA was not.  Pt denies fever, neck pain, sore throat, visual disturbance, CP, cough, dyspnea, abdominal pain, nausea, diarrhea, urinary symptoms, back pain, weakness, numbness and rash as associated symptoms.  Pt has no further relevant h/o chronic medical conditions.  Pt is a current everyday smoker but denies alcohol use.    Past Medical History  Diagnosis Date  . GERD (gastroesophageal reflux disease)   . Asthma   . Migraines   . IBS (irritable bowel syndrome)     Past Surgical History  Procedure Date  . Cesarean section   . Abdominal hysterectomy     right SOO    Family History  Problem Relation Age of Onset  . Stroke Mother   . Cancer Father     ?esophageal  . Stroke Father   . Colon cancer Neg Hx   . Lung cancer Maternal Grandfather     History  Substance Use Topics  . Smoking status: Current Every Day Smoker -- 0.0 packs/day    Types: Cigarettes  . Smokeless tobacco: Not on file   Comment: cut back to 1/3 pack per day  . Alcohol Use: No    No OB history provided.   Review of Systems  Constitutional: Negative for fatigue.  HENT: Negative for congestion, sinus pressure and ear discharge.   Eyes: Negative for discharge.  Respiratory: Negative for cough.   Cardiovascular: Negative for chest pain.  Gastrointestinal: Negative for abdominal pain and diarrhea.  Genitourinary: Negative for frequency and hematuria.  Musculoskeletal: Negative for back pain.  Skin: Negative for rash.  Neurological: Positive for headaches. Negative for dizziness, seizures and light-headedness.  Hematological: Negative.   Psychiatric/Behavioral: Negative for hallucinations.    Allergies  Metronidazole and Advil  Home Medications   Current Outpatient Rx  Name Route Sig Dispense Refill  . ALBUTEROL SULFATE HFA 108 (90 BASE) MCG/ACT IN AERS Inhalation Inhale 2 puffs into the lungs every 6 (six) hours as needed. For shortness of breath    . BC HEADACHE POWDER PO Oral Take 1 packet by mouth 5 (five) times daily as needed. For headache    . HYDROCODONE-ACETAMINOPHEN 5-500 MG PO TABS Oral Take 1-2 tablets by mouth every 6 (six) hours as needed for pain. 12 tablet 0  . OMEPRAZOLE 20 MG PO CPDR Oral Take 1 capsule (20 mg total) by mouth 2 (two) times daily.    Marland Kitchen RANITIDINE HCL 150 MG PO TABS Oral Take 150 mg by mouth daily.      BP  170/99  Pulse 71  Temp 97.4 F (36.3 C) (Oral)  Resp 20  Ht 5\' 6"  (1.676 m)  Wt 179 lb (81.194 kg)  BMI 28.89 kg/m2  SpO2 100%  Physical Exam  Nursing note and vitals reviewed. Constitutional: She is oriented to person, place, and time. She appears well-developed.  HENT:  Head: Normocephalic and atraumatic.  Eyes: Conjunctivae normal and EOM are normal. No scleral icterus.  Neck: Neck supple. No thyromegaly present.  Cardiovascular: Normal rate and regular rhythm.  Exam reveals no gallop and no friction rub.   No murmur heard. Pulmonary/Chest: Effort  normal. No stridor. She has no wheezes. She has no rales. She exhibits no tenderness.  Abdominal: She exhibits no distension. There is no tenderness. There is no rebound.  Musculoskeletal: Normal range of motion. She exhibits no edema.  Lymphadenopathy:    She has no cervical adenopathy.  Neurological: She is oriented to person, place, and time. Coordination normal.  Skin: No rash noted. No erythema.  Psychiatric: She has a normal mood and affect. Her behavior is normal.    ED Course  Procedures (including critical care time) DIAGNOSTIC STUDIES: Oxygen Saturation is 100% on room air, normal by my interpretation.    COORDINATION OF CARE: 21:24-  Patient informed of clinical course, understands medical decision-making process, and agrees with plan.  Ordered IV Reglan, IV dilaudid, and IV benadryl.      Labs Reviewed - No data to display No results found.   No diagnosis found.    MDM    The chart was scribed for me under my direct supervision.  I personally performed the history, physical, and medical decision making and all procedures in the evaluation of this patient.Benny Lennert, MD 09/13/12 [phone]

## 2012-09-13 NOTE — ED Notes (Signed)
Headache onset today with vomiting

## 2012-11-17 IMAGING — CR DG ABDOMEN ACUTE W/ 1V CHEST
4 series · 4 of 4 positions shown · non-contrast
Comparison: 12/20/2011 radiographs and CT 01/25/2011.

CLINICAL DATA: Abdominal pain with bright red blood per rectum.

ACUTE ABDOMEN SERIES (ABDOMEN 2 VIEW & CHEST 1 VIEW)

[view not recorded (1 of 4)]
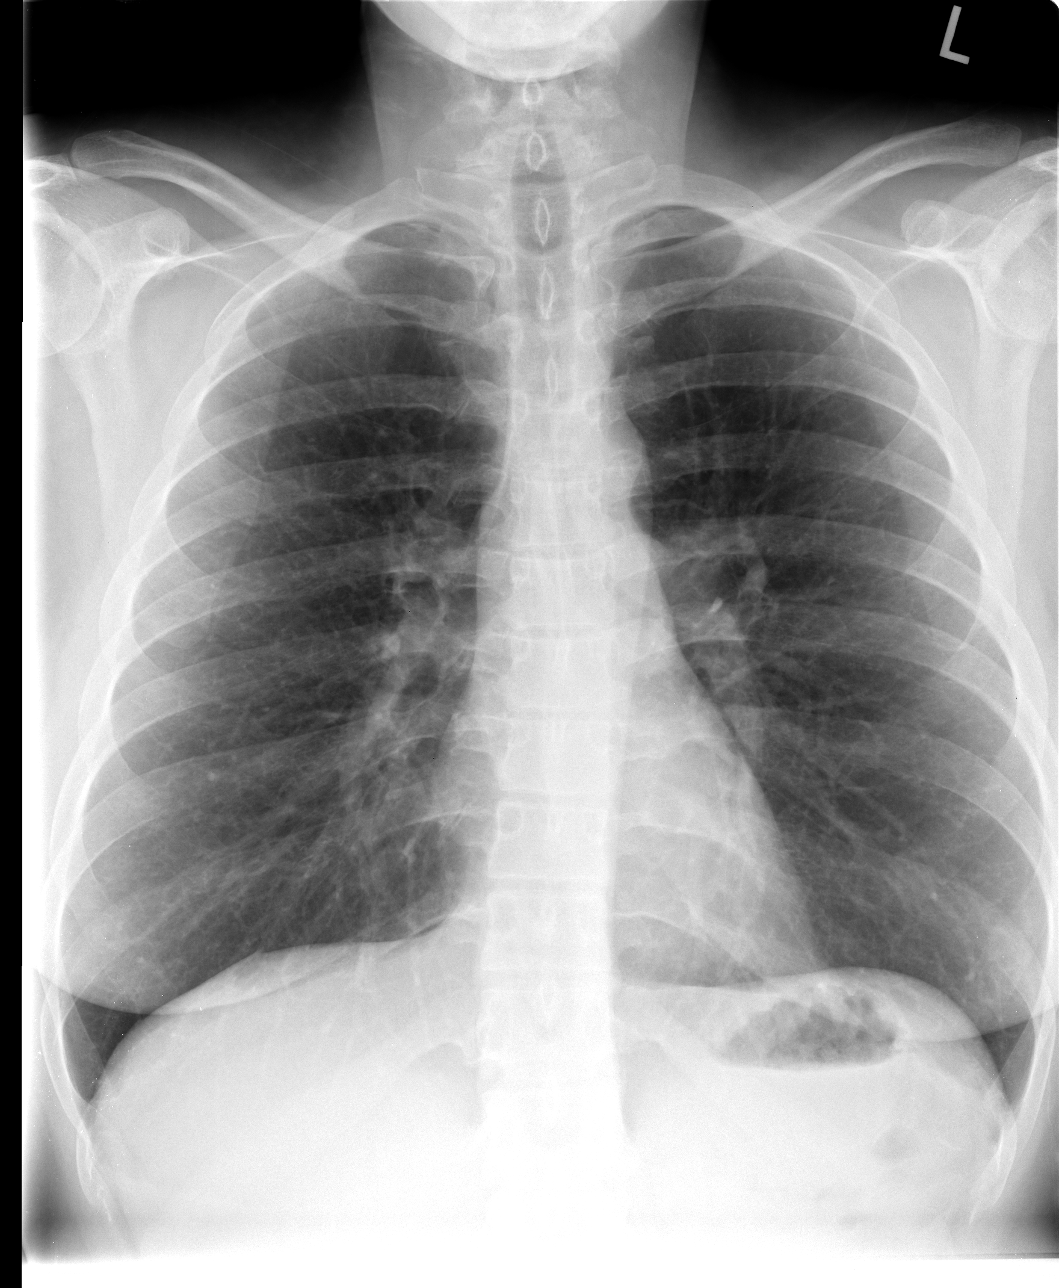

[view not recorded (2 of 4)]
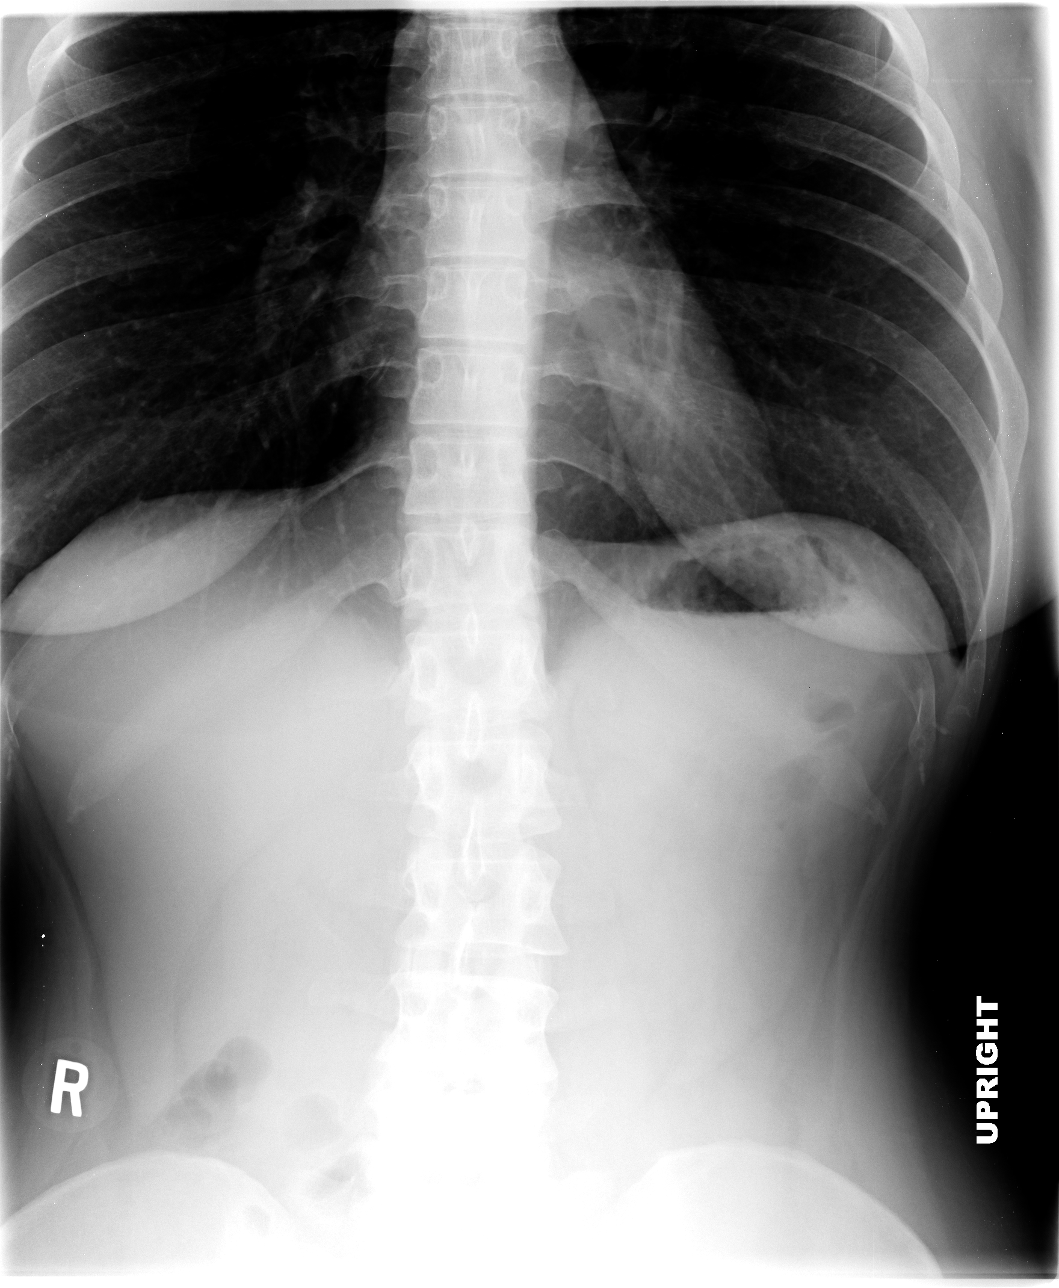

[view not recorded (3 of 4)]
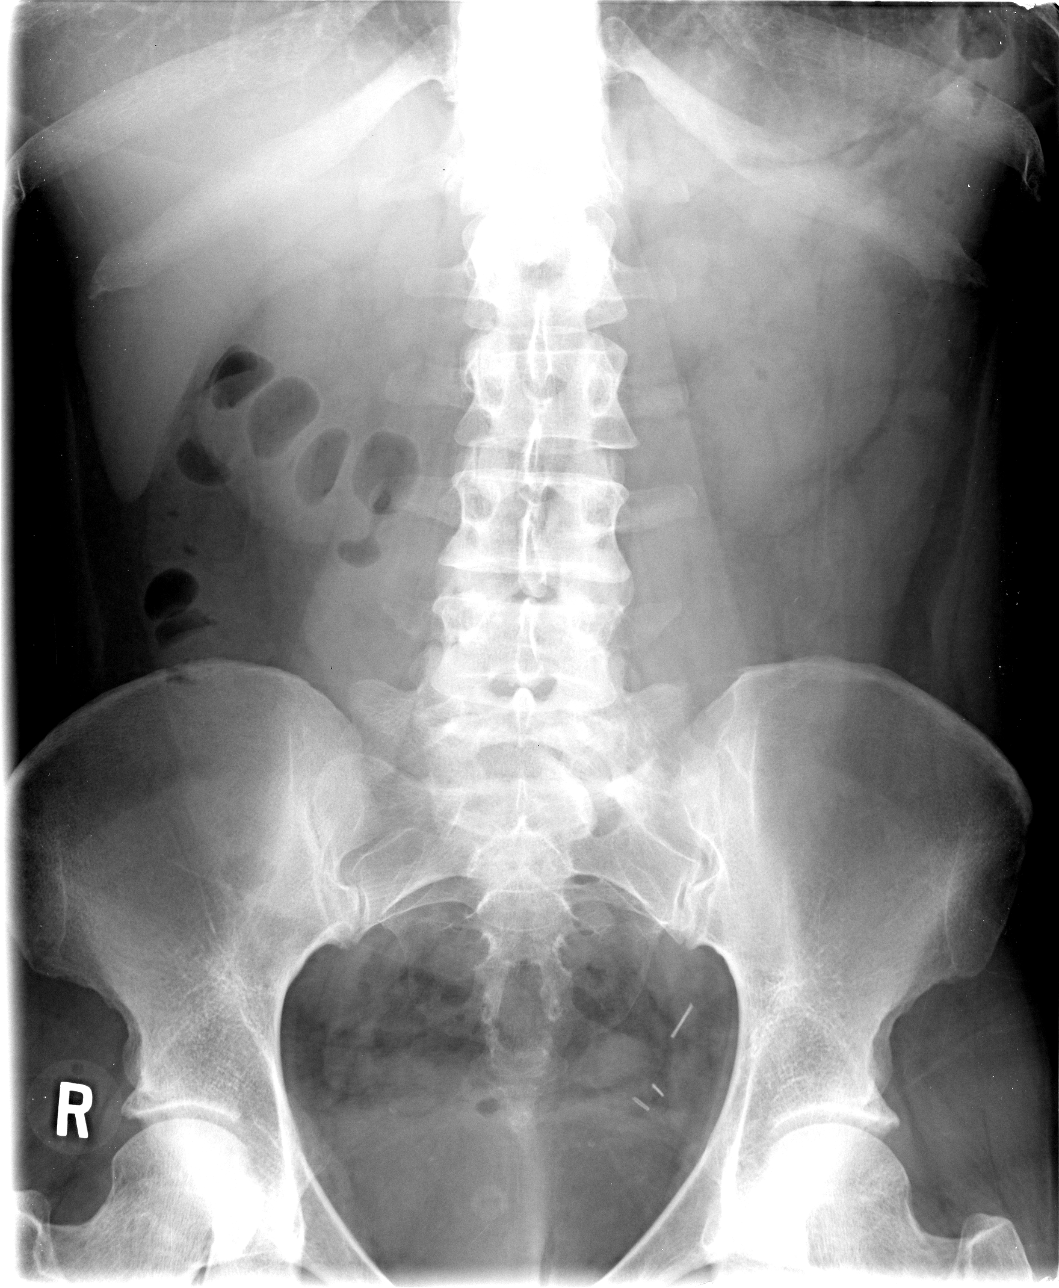

[view not recorded (4 of 4)]
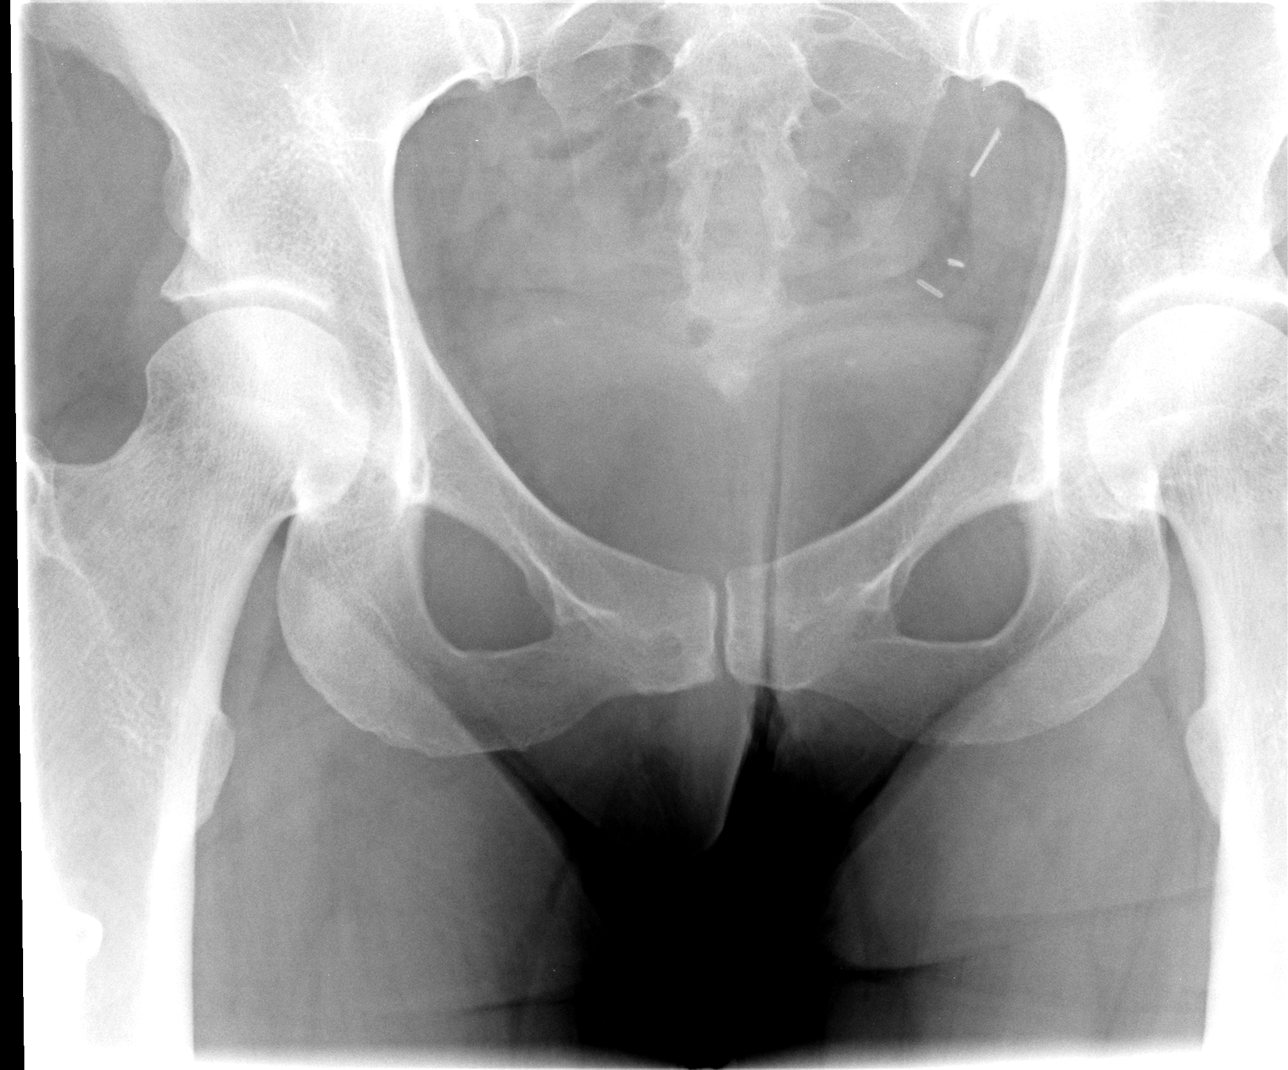

[4 of 4 positions shown; findings below may reference images not displayed]

FINDINGS: The heart size and mediastinal contours are normal. The
lungs are clear. There is no pleural effusion or pneumothorax. No
acute osseous findings are identified.

Paucity of bowel gas is again noted.  There is no evidence of bowel
obstruction or pneumoperitoneum.  Left pelvic surgical clips are
stable.  There are no suspicious calcifications.
IMPRESSION: Stable examination.  No acute cardiopulmonary or abdominal process
identified.

## 2012-11-21 ENCOUNTER — Encounter (HOSPITAL_COMMUNITY): Payer: Self-pay | Admitting: *Deleted

## 2012-11-21 ENCOUNTER — Emergency Department (HOSPITAL_COMMUNITY)
Admission: EM | Admit: 2012-11-21 | Discharge: 2012-11-21 | Disposition: A | Discharge status: Home / Self Care | Payer: Medicaid Other | Attending: Emergency Medicine | Admitting: Emergency Medicine

## 2012-11-21 DIAGNOSIS — Z90710 Acquired absence of both cervix and uterus: Secondary | ICD-10-CM | POA: Insufficient documentation

## 2012-11-21 DIAGNOSIS — Z8679 Personal history of other diseases of the circulatory system: Secondary | ICD-10-CM | POA: Insufficient documentation

## 2012-11-21 DIAGNOSIS — N390 Urinary tract infection, site not specified: Secondary | ICD-10-CM | POA: Insufficient documentation

## 2012-11-21 DIAGNOSIS — Z8719 Personal history of other diseases of the digestive system: Secondary | ICD-10-CM | POA: Insufficient documentation

## 2012-11-21 DIAGNOSIS — F17200 Nicotine dependence, unspecified, uncomplicated: Secondary | ICD-10-CM | POA: Insufficient documentation

## 2012-11-21 DIAGNOSIS — J45909 Unspecified asthma, uncomplicated: Secondary | ICD-10-CM | POA: Insufficient documentation

## 2012-11-21 DIAGNOSIS — Z79899 Other long term (current) drug therapy: Secondary | ICD-10-CM | POA: Insufficient documentation

## 2012-11-21 LAB — URINE MICROSCOPIC-ADD ON

## 2012-11-21 LAB — URINALYSIS, ROUTINE W REFLEX MICROSCOPIC
Bilirubin Urine: NEGATIVE
Glucose, UA: NEGATIVE mg/dL
Hgb urine dipstick: NEGATIVE
Ketones, ur: NEGATIVE mg/dL
Nitrite: NEGATIVE
Protein, ur: NEGATIVE mg/dL
Specific Gravity, Urine: 1.024 (ref 1.005–1.030)
Urobilinogen, UA: 1 mg/dL (ref 0.0–1.0)
pH: 5 (ref 5.0–8.0)

## 2012-11-21 LAB — PREGNANCY, URINE: Preg Test, Ur: NEGATIVE

## 2012-11-21 LAB — WET PREP, GENITAL
Clue Cells Wet Prep HPF POC: NONE SEEN
Trich, Wet Prep: NONE SEEN
Yeast Wet Prep HPF POC: NONE SEEN

## 2012-11-21 MED ORDER — CEPHALEXIN 500 MG PO CAPS: 500.0000 mg | ORAL_CAPSULE | Freq: Four times a day (QID) | ORAL | Status: DC

## 2012-11-21 MED ORDER — IBUPROFEN 800 MG PO TABS
800.0000 mg | ORAL_TABLET | Freq: Once | ORAL | Status: AC
  Administered 2012-11-21: 800 mg via ORAL
  Filled 2012-11-21: qty 1

## 2012-11-21 NOTE — ED Notes (Signed)
Patient c/o low back pain, urine frequency, burning with urination. Pain 4/10. Patient states symptoms started afternoon 11/18/12. Patient states symptoms seemed worse this AM. Urine has been sent for processing.

## 2012-11-21 NOTE — ED Notes (Signed)
Pt states that her lower back across both sides was "sore" yesterday and then this am pt states back pain was worse; pt also states that she is having burning upon urination and urgency for a few days; pt c/o "itchy" sensation to inside.

## 2012-11-21 NOTE — ED Provider Notes (Signed)
History     CSN: [id number]  Arrival date & time 11/21/12  8469   First MD Initiated Contact with Patient 11/21/12 [phone]      Chief Complaint  Patient presents with  . Back Pain    (Consider location/radiation/quality/duration/timing/severity/associated sxs/prior treatment) Patient is a 36 y.o. female presenting with back pain. The history is provided by the patient (pt complains of dysuria).  Back Pain  This is a new problem. The current episode started more than 2 days ago. The problem occurs constantly. The problem has not changed since onset.The pain is associated with no known injury. The pain is present in the lumbar spine. The quality of the pain is described as aching. The pain is at a severity of 3/10. The pain is moderate. The symptoms are aggravated by certain positions. Associated symptoms include dysuria. Pertinent negatives include no chest pain, no headaches and no abdominal pain. She has tried nothing for the symptoms. The treatment provided no relief.    Past Medical History  Diagnosis Date  . GERD (gastroesophageal reflux disease)   . Asthma   . Migraines   . IBS (irritable bowel syndrome)     Past Surgical History  Procedure Date  . Cesarean section   . Abdominal hysterectomy     right SOO    Family History  Problem Relation Age of Onset  . Stroke Mother   . Cancer Father     ?esophageal  . Stroke Father   . Colon cancer Neg Hx   . Lung cancer Maternal Grandfather     History  Substance Use Topics  . Smoking status: Current Every Day Smoker -- 0.0 packs/day    Types: Cigarettes  . Smokeless tobacco: Not on file     Comment: cut back to 1/3 pack per day  . Alcohol Use: No    OB History    Grav Para Term Preterm Abortions TAB SAB Ect Mult Living                  Review of Systems  Constitutional: Negative for fatigue.  HENT: Negative for congestion, sinus pressure and ear discharge.   Eyes: Negative for discharge.  Respiratory: Negative  for cough.   Cardiovascular: Negative for chest pain.  Gastrointestinal: Negative for abdominal pain and diarrhea.  Genitourinary: Positive for dysuria. Negative for frequency and hematuria.  Musculoskeletal: Positive for back pain.  Skin: Negative for rash.  Neurological: Negative for seizures and headaches.  Hematological: Negative.   Psychiatric/Behavioral: Negative for hallucinations.    Allergies  Metronidazole and Advil  Home Medications   Current Outpatient Rx  Name  Route  Sig  Dispense  Refill  . ALBUTEROL SULFATE HFA 108 (90 BASE) MCG/ACT IN AERS   Inhalation   Inhale 2 puffs into the lungs every 6 (six) hours as needed. For shortness of breath         . BC HEADACHE POWDER PO   Oral   Take 1 packet by mouth 5 (five) times daily as needed. For headache         . CEPHALEXIN 500 MG PO CAPS   Oral   Take 1 capsule (500 mg total) by mouth 4 (four) times daily.   28 capsule   0   . HYDROCODONE-ACETAMINOPHEN 5-500 MG PO TABS   Oral   Take 1-2 tablets by mouth every 6 (six) hours as needed for pain.   12 tablet   0   . OMEPRAZOLE 20 MG PO CPDR  Oral   Take 1 capsule (20 mg total) by mouth 2 (two) times daily.         Marland Kitchen RANITIDINE HCL 150 MG PO TABS   Oral   Take 150 mg by mouth daily.           BP 131/92  Pulse 75  Temp 98.3 F (36.8 C) (Oral)  Resp 16  SpO2 98%  Physical Exam  Constitutional: She is oriented to person, place, and time. She appears well-developed.  HENT:  Head: Normocephalic and atraumatic.  Eyes: Conjunctivae normal and EOM are normal. No scleral icterus.  Neck: Neck supple. No thyromegaly present.  Cardiovascular: Normal rate and regular rhythm.  Exam reveals no gallop and no friction rub.   No murmur heard. Pulmonary/Chest: No stridor. She has no wheezes. She has no rales. She exhibits no tenderness.  Abdominal: She exhibits no distension. There is no tenderness. There is no rebound.  Genitourinary:       Normal vaginal  exam  Musculoskeletal: Normal range of motion. She exhibits no edema.       Tender lower back  Lymphadenopathy:    She has no cervical adenopathy.  Neurological: She is oriented to person, place, and time. Coordination normal.  Skin: No rash noted. No erythema.  Psychiatric: She has a normal mood and affect. Her behavior is normal.    ED Course  Procedures (including critical care time)  Labs Reviewed  URINALYSIS, ROUTINE W REFLEX MICROSCOPIC - Abnormal; Notable for the following:    APPearance CLOUDY (*)     Leukocytes, UA MODERATE (*)     All other components within normal limits  URINE MICROSCOPIC-ADD ON - Abnormal; Notable for the following:    Squamous Epithelial / LPF FEW (*)     Bacteria, UA FEW (*)     All other components within normal limits  WET PREP, GENITAL - Abnormal; Notable for the following:    WBC, Wet Prep HPF POC FEW (*)     All other components within normal limits  PREGNANCY, URINE  URINE CULTURE   No results found.   1. UTI (lower urinary tract infection)       MDM          Benny Lennert, MD 11/21/12 [phone]

## 2012-11-21 NOTE — Discharge Instructions (Signed)
Drink plenty of fluids.   Follow up if not improving. °

## 2012-11-22 LAB — URINE CULTURE: Colony Count: 40000

## 2013-03-16 ENCOUNTER — Emergency Department (HOSPITAL_COMMUNITY)
Admission: EM | Admit: 2013-03-16 | Discharge: 2013-03-16 | Disposition: A | Discharge status: Home / Self Care | Payer: Medicaid Other | Attending: Emergency Medicine | Admitting: Emergency Medicine

## 2013-03-16 ENCOUNTER — Encounter (HOSPITAL_COMMUNITY): Payer: Self-pay | Admitting: General Practice

## 2013-03-16 DIAGNOSIS — J45909 Unspecified asthma, uncomplicated: Secondary | ICD-10-CM | POA: Insufficient documentation

## 2013-03-16 DIAGNOSIS — K219 Gastro-esophageal reflux disease without esophagitis: Secondary | ICD-10-CM | POA: Insufficient documentation

## 2013-03-16 DIAGNOSIS — Z79899 Other long term (current) drug therapy: Secondary | ICD-10-CM | POA: Insufficient documentation

## 2013-03-16 DIAGNOSIS — B373 Candidiasis of vulva and vagina: Secondary | ICD-10-CM

## 2013-03-16 DIAGNOSIS — B3731 Acute candidiasis of vulva and vagina: Secondary | ICD-10-CM | POA: Insufficient documentation

## 2013-03-16 DIAGNOSIS — G43909 Migraine, unspecified, not intractable, without status migrainosus: Secondary | ICD-10-CM | POA: Insufficient documentation

## 2013-03-16 DIAGNOSIS — F17200 Nicotine dependence, unspecified, uncomplicated: Secondary | ICD-10-CM | POA: Insufficient documentation

## 2013-03-16 DIAGNOSIS — Z8719 Personal history of other diseases of the digestive system: Secondary | ICD-10-CM | POA: Insufficient documentation

## 2013-03-16 DIAGNOSIS — R350 Frequency of micturition: Secondary | ICD-10-CM | POA: Insufficient documentation

## 2013-03-16 DIAGNOSIS — M549 Dorsalgia, unspecified: Secondary | ICD-10-CM | POA: Insufficient documentation

## 2013-03-16 LAB — URINALYSIS, ROUTINE W REFLEX MICROSCOPIC
Bilirubin Urine: NEGATIVE
Glucose, UA: NEGATIVE mg/dL
Hgb urine dipstick: NEGATIVE
Ketones, ur: NEGATIVE mg/dL
Leukocytes, UA: NEGATIVE
Nitrite: NEGATIVE
Protein, ur: NEGATIVE mg/dL
Specific Gravity, Urine: 1.024 (ref 1.005–1.030)
Urobilinogen, UA: 1 mg/dL (ref 0.0–1.0)
pH: 7.5 (ref 5.0–8.0)

## 2013-03-16 LAB — WET PREP, GENITAL
Clue Cells Wet Prep HPF POC: NONE SEEN
Trich, Wet Prep: NONE SEEN
Yeast Wet Prep HPF POC: NONE SEEN

## 2013-03-16 LAB — POCT PREGNANCY, URINE: Preg Test, Ur: NEGATIVE

## 2013-03-16 LAB — GLUCOSE, CAPILLARY: Glucose-Capillary: 87 mg/dL (ref 70–99)

## 2013-03-16 MED ORDER — FLUCONAZOLE 150 MG PO TABS
150.0000 mg | ORAL_TABLET | Freq: Once | ORAL | Status: AC
  Administered 2013-03-16: 150 mg via ORAL
  Filled 2013-03-16: qty 1

## 2013-03-16 NOTE — ED Provider Notes (Addendum)
History     CSN: [id number]  Arrival date & time 03/16/13  4098   First MD Initiated Contact with Patient 03/16/13 0932      Chief Complaint  Patient presents with  . Dysuria  . Back Pain    (Consider location/radiation/quality/duration/timing/severity/associated sxs/prior treatment) Patient is a 37 y.o. female presenting with dysuria. The history is provided by the patient.  Dysuria  This is a recurrent problem. Episode onset: states worse over this week but intermittent since dec. The problem occurs every urination. The problem has not changed since onset.The quality of the pain is described as burning and stabbing. The pain is at a severity of 5/10. The pain is moderate. There has been no fever. She is sexually active. Associated symptoms include discharge and frequency. Pertinent negatives include no chills, no nausea, no vomiting, no hematuria, no possible pregnancy and no flank pain. Treatments tried: had abx in dec with some improvement then sx returned. Her past medical history is significant for recurrent UTIs. Her past medical history does not include kidney stones, single kidney or urological procedure.    Past Medical History  Diagnosis Date  . GERD (gastroesophageal reflux disease)   . Asthma   . Migraines   . IBS (irritable bowel syndrome)     Past Surgical History  Procedure Laterality Date  . Cesarean section    . Abdominal hysterectomy      right SOO    Family History  Problem Relation Age of Onset  . Stroke Mother   . Cancer Father     ?esophageal  . Stroke Father   . Colon cancer Neg Hx   . Lung cancer Maternal Grandfather     History  Substance Use Topics  . Smoking status: Current Every Day Smoker -- 0.00 packs/day    Types: Cigarettes  . Smokeless tobacco: Not on file     Comment: cut back to 1/3 pack per day  . Alcohol Use: No    OB History   Grav Para Term Preterm Abortions TAB SAB Ect Mult Living                  Review of Systems   Constitutional: Negative for chills.  Gastrointestinal: Negative for nausea and vomiting.  Genitourinary: Positive for dysuria and frequency. Negative for hematuria and flank pain.  All other systems reviewed and are negative.    Allergies  Metronidazole and Advil  Home Medications   Current Outpatient Rx  Name  Route  Sig  Dispense  Refill  . albuterol (PROVENTIL HFA;VENTOLIN HFA) 108 (90 BASE) MCG/ACT inhaler   Inhalation   Inhale 2 puffs into the lungs every 6 (six) hours as needed. For shortness of breath         . Aspirin-Salicylamide-Caffeine (BC HEADACHE POWDER PO)   Oral   Take 1 packet by mouth 5 (five) times daily as needed. For headache         . estradiol (VIVELLE-DOT) 0.1 MG/24HR   Transdermal   Place 1 patch onto the skin 2 (two) times a week. Change on Thursdays and Sundays         . HYDROcodone-acetaminophen (NORCO/VICODIN) 5-325 MG per tablet   Oral   Take 1 tablet by mouth every 6 (six) hours as needed for pain (migraine).         . pantoprazole (PROTONIX) 40 MG tablet   Oral   Take 40 mg by mouth daily.         Marland Kitchen  ranitidine (ZANTAC) 150 MG tablet   Oral   Take 150 mg by mouth daily as needed for heartburn.            BP 162/91  Pulse 83  Temp(Src) 98.8 F (37.1 C) (Oral)  Resp 16  SpO2 97%  Physical Exam  Nursing note and vitals reviewed. Constitutional: She is oriented to person, place, and time. She appears well-developed and well-nourished. No distress.  HENT:  Head: Normocephalic and atraumatic.  Mouth/Throat: Oropharynx is clear and moist.  Eyes: Conjunctivae and EOM are normal. Pupils are equal, round, and reactive to light.  Neck: Normal range of motion. Neck supple.  Cardiovascular: Normal rate, regular rhythm and intact distal pulses.   No murmur heard. Pulmonary/Chest: Effort normal and breath sounds normal. No respiratory distress. She has no wheezes. She has no rales.  Abdominal: Soft. She exhibits no distension.  There is tenderness in the suprapubic area. There is no rebound, no guarding and no CVA tenderness.  Genitourinary: Cervix exhibits discharge. Right adnexum displays no mass, no tenderness and no fullness. Left adnexum displays no mass, no tenderness and no fullness. No tenderness around the vagina. Vaginal discharge found.  Curd-like vaginal d/c consistent with yeast  Musculoskeletal: Normal range of motion. She exhibits no edema and no tenderness.  Neurological: She is alert and oriented to person, place, and time.  Skin: Skin is warm and dry. No rash noted. No erythema.  Psychiatric: She has a normal mood and affect. Her behavior is normal.    ED Course  Procedures (including critical care time)  Labs Reviewed  WET PREP, GENITAL - Abnormal; Notable for the following:    WBC, Wet Prep HPF POC RARE (*)    All other components within normal limits  URINALYSIS, ROUTINE W REFLEX MICROSCOPIC - Abnormal; Notable for the following:    APPearance CLOUDY (*)    All other components within normal limits  GC/CHLAMYDIA PROBE AMP  POCT PREGNANCY, URINE   No results found.   1. Vaginal candida       MDM   Condition complaining of dysuria that's been intermittent since December. She states in December she was treated with Keflex and the symptoms improved but didn't have returned.  She states she gets somewhat frequent UTIs but denies any fever, vomiting or symptoms suggestive of pyelonephritis. She does complain of some mild vaginal discharge but denies any new sexual partners and has had the same partner for the last 8 years. However she states she has not been tested for any STDs and does want to be checked.  Physical exam is unremarkable except for some mild suprapubic tenderness. Low concern for kidney stones as a cause of her recurrent dysuria.  UA, wet prep and GC chlamydia pending. Patient is status post hysterectomy so pregnancy is not possible.  10:48 AM UA neg for UTI and pelvic  exam with yeast present but o/w wnl.  Will check blood sugar but no hx of DM.  No glucose in urine.  Will treat for yeast and have f/u with PCP as Wet prep unrevealing.     Gwyneth Sprout, MD 03/16/13 1049  Gwyneth Sprout, MD 03/16/13 1112

## 2013-03-16 NOTE — Discharge Instructions (Signed)
Candidal Vulvovaginitis Candidal vulvovaginitis is an infection of the vagina and vulva. The vulva is the skin around the opening of the vagina. This may cause itching and discomfort in and around the vagina.  HOME CARE  Only take medicine as told by your doctor.  Do not have sex (intercourse) until the infection is healed or as told by your doctor.  Practice safe sex.  Tell your sex partner about your infection.  Do not douche or use tampons.  Wear cotton underwear. Do not wear tight pants or panty hose.  Eat yogurt. This may help treat and prevent yeast infections. GET HELP RIGHT AWAY IF:   You have a fever.  Your problems get worse during treatment or do not get better in 3 days.  You have discomfort, irritation, or itching in your vagina or vulva area.  You have pain after sex.  You start to get belly (abdominal) pain. MAKE SURE YOU:  Understand these instructions.  Will watch your condition.  Will get help right away if you are not doing well or get worse. Document Released: 02/12/2009 Document Revised: 02/08/2012 Document Reviewed: 02/12/2009 [employer] Patient Information 2013 [employer]  

## 2013-03-16 NOTE — ED Notes (Signed)
Pt escorted to discharge window. Pt verbalized understanding discharge instructions. In no acute distress.  

## 2013-03-16 NOTE — ED Notes (Signed)
Pt reports dysuria and lower back pain increasing over the past 2-3 days. Pt reports hx of UTI and was treated with Keflex in December. Reports night sweats and chills, denies pain but describes discomfort. Reports nausea denies V/D.

## 2013-03-16 NOTE — ED Notes (Signed)
Pt alert and oriented x4. Respirations even and unlabored, bilateral symmetrical rise and fall of chest. Skin warm and dry. In no acute distress. Denies needs.   

## 2013-03-17 LAB — GC/CHLAMYDIA PROBE AMP
CT Probe RNA: NEGATIVE
GC Probe RNA: NEGATIVE

## 2013-06-05 ENCOUNTER — Telehealth: Payer: Self-pay | Admitting: Family Medicine

## 2013-06-05 MED ORDER — ALBUTEROL SULFATE HFA 108 (90 BASE) MCG/ACT IN AERS: 2.0000 | INHALATION_SPRAY | Freq: Four times a day (QID) | RESPIRATORY_TRACT | Status: DC | PRN

## 2013-06-05 NOTE — Telephone Encounter (Signed)
Rx Refilled  

## 2013-06-07 ENCOUNTER — Ambulatory Visit: Payer: Self-pay | Admitting: Adult Health

## 2013-06-10 ENCOUNTER — Emergency Department (HOSPITAL_COMMUNITY)
Admission: EM | Admit: 2013-06-10 | Discharge: 2013-06-10 | Disposition: A | Discharge status: Home / Self Care | Payer: Medicaid Other | Attending: Emergency Medicine | Admitting: Emergency Medicine

## 2013-06-10 ENCOUNTER — Encounter (HOSPITAL_COMMUNITY): Payer: Self-pay | Admitting: Cardiology

## 2013-06-10 DIAGNOSIS — M545 Low back pain, unspecified: Secondary | ICD-10-CM | POA: Insufficient documentation

## 2013-06-10 DIAGNOSIS — R109 Unspecified abdominal pain: Secondary | ICD-10-CM | POA: Insufficient documentation

## 2013-06-10 DIAGNOSIS — Z8679 Personal history of other diseases of the circulatory system: Secondary | ICD-10-CM | POA: Insufficient documentation

## 2013-06-10 DIAGNOSIS — J45909 Unspecified asthma, uncomplicated: Secondary | ICD-10-CM | POA: Insufficient documentation

## 2013-06-10 DIAGNOSIS — R300 Dysuria: Secondary | ICD-10-CM | POA: Insufficient documentation

## 2013-06-10 DIAGNOSIS — Z90710 Acquired absence of both cervix and uterus: Secondary | ICD-10-CM | POA: Insufficient documentation

## 2013-06-10 DIAGNOSIS — K219 Gastro-esophageal reflux disease without esophagitis: Secondary | ICD-10-CM | POA: Insufficient documentation

## 2013-06-10 DIAGNOSIS — Z87440 Personal history of urinary (tract) infections: Secondary | ICD-10-CM | POA: Insufficient documentation

## 2013-06-10 DIAGNOSIS — K589 Irritable bowel syndrome without diarrhea: Secondary | ICD-10-CM | POA: Insufficient documentation

## 2013-06-10 DIAGNOSIS — F17200 Nicotine dependence, unspecified, uncomplicated: Secondary | ICD-10-CM | POA: Insufficient documentation

## 2013-06-10 DIAGNOSIS — Z3202 Encounter for pregnancy test, result negative: Secondary | ICD-10-CM | POA: Insufficient documentation

## 2013-06-10 DIAGNOSIS — N898 Other specified noninflammatory disorders of vagina: Secondary | ICD-10-CM | POA: Insufficient documentation

## 2013-06-10 DIAGNOSIS — Z79899 Other long term (current) drug therapy: Secondary | ICD-10-CM | POA: Insufficient documentation

## 2013-06-10 LAB — CBC WITH DIFFERENTIAL/PLATELET
Basophils Absolute: 0 10*3/uL (ref 0.0–0.1)
Basophils Relative: 1 % (ref 0–1)
Eosinophils Absolute: 0.1 10*3/uL (ref 0.0–0.7)
Eosinophils Relative: 2 % (ref 0–5)
HCT: 39.6 % (ref 36.0–46.0)
Hemoglobin: 13.9 g/dL (ref 12.0–15.0)
Lymphocytes Relative: 32 % (ref 12–46)
Lymphs Abs: 1.9 10*3/uL (ref 0.7–4.0)
MCH: 31.7 pg (ref 26.0–34.0)
MCHC: 35.1 g/dL (ref 30.0–36.0)
MCV: 90.2 fL (ref 78.0–100.0)
Monocytes Absolute: 0.4 10*3/uL (ref 0.1–1.0)
Monocytes Relative: 6 % (ref 3–12)
Neutro Abs: 3.6 10*3/uL (ref 1.7–7.7)
Neutrophils Relative %: 60 % (ref 43–77)
Platelets: 199 10*3/uL (ref 150–400)
RBC: 4.39 MIL/uL (ref 3.87–5.11)
RDW: 12.1 % (ref 11.5–15.5)
WBC: 6 10*3/uL (ref 4.0–10.5)

## 2013-06-10 LAB — POCT I-STAT, CHEM 8
BUN: 12 mg/dL (ref 6–23)
Calcium, Ion: 1.21 mmol/L (ref 1.12–1.23)
Chloride: 109 mEq/L (ref 96–112)
Creatinine, Ser: 0.9 mg/dL (ref 0.50–1.10)
Glucose, Bld: 83 mg/dL (ref 70–99)
HCT: 43 % (ref 36.0–46.0)
Hemoglobin: 14.6 g/dL (ref 12.0–15.0)
Potassium: 3.6 mEq/L (ref 3.5–5.1)
Sodium: 142 mEq/L (ref 135–145)
TCO2: 22 mmol/L (ref 0–100)

## 2013-06-10 LAB — URINALYSIS, ROUTINE W REFLEX MICROSCOPIC
Bilirubin Urine: NEGATIVE
Glucose, UA: NEGATIVE mg/dL
Hgb urine dipstick: NEGATIVE
Ketones, ur: NEGATIVE mg/dL
Leukocytes, UA: NEGATIVE
Nitrite: NEGATIVE
Protein, ur: NEGATIVE mg/dL
Specific Gravity, Urine: 1.014 (ref 1.005–1.030)
Urobilinogen, UA: 0.2 mg/dL (ref 0.0–1.0)
pH: 5.5 (ref 5.0–8.0)

## 2013-06-10 LAB — WET PREP, GENITAL
Clue Cells Wet Prep HPF POC: NONE SEEN
Trich, Wet Prep: NONE SEEN
WBC, Wet Prep HPF POC: NONE SEEN
Yeast Wet Prep HPF POC: NONE SEEN

## 2013-06-10 LAB — POCT PREGNANCY, URINE: Preg Test, Ur: NEGATIVE

## 2013-06-10 NOTE — ED Provider Notes (Signed)
History    CSN: [id number] Arrival date & time 06/10/13  0757  First MD Initiated Contact with Patient 06/10/13 [phone]     Chief Complaint  Patient presents with  . Urinary Tract Infection   (Consider location/radiation/quality/duration/timing/severity/associated sxs/prior Treatment) HPI Comments: 37 y.o. Female with PMHx of UTI, partial hysterectomy, and IBS presents today complaining of burning on urination, lower abdominal pain for 2 days. Describes pain as mild, crampy, localized, worse with urination. Pt has tried [facility] powder with no relief. Pt admits white itchy vaginal discharge and lower back pain. Denies fever, hematuria, diarrhea/constipation, nausea, vomiting, chest pain, shortness of breath, recent sick contacts. Pt does not have a hx of kidney stones.     Patient is a 37 y.o. female presenting with urinary tract infection.  Urinary Tract Infection Associated symptoms include abdominal pain. Pertinent negatives include no chest pain, diaphoresis, fever, headaches, nausea, neck pain, numbness, rash, vomiting or weakness.   Past Medical History  Diagnosis Date  . GERD (gastroesophageal reflux disease)   . Asthma   . Migraines   . IBS (irritable bowel syndrome)    Past Surgical History  Procedure Laterality Date  . Cesarean section    . Abdominal hysterectomy      right SOO   Family History  Problem Relation Age of Onset  . Stroke Mother   . Cancer Father     ?esophageal  . Stroke Father   . Colon cancer Neg Hx   . Lung cancer Maternal Grandfather    History  Substance Use Topics  . Smoking status: Current Every Day Smoker -- 0.00 packs/day    Types: Cigarettes  . Smokeless tobacco: Not on file     Comment: cut back to 1/3 pack per day  . Alcohol Use: No   OB History   Grav Para Term Preterm Abortions TAB SAB Ect Mult Living                 Review of Systems  Constitutional: Negative for fever and diaphoresis.  HENT: Negative for neck pain and neck  stiffness.   Eyes: Negative for visual disturbance.  Respiratory: Negative for apnea, chest tightness and shortness of breath.   Cardiovascular: Negative for chest pain and palpitations.  Gastrointestinal: Positive for abdominal pain. Negative for nausea, vomiting, diarrhea and constipation.       Suprapubic  Genitourinary: Positive for dysuria and vaginal discharge. Negative for hematuria and flank pain.       Burning with urination, white vaginal discharge  Musculoskeletal: Negative for back pain and gait problem.  Skin: Negative for rash.  Neurological: Negative for dizziness, weakness, light-headedness, numbness and headaches.    Allergies  Metronidazole and Advil  Home Medications   Current Outpatient Rx  Name  Route  Sig  Dispense  Refill  . Aspirin-Salicylamide-Caffeine (BC HEADACHE POWDER PO)   Oral   Take 1 packet by mouth 5 (five) times daily as needed. For headache         . HYDROcodone-acetaminophen (NORCO/VICODIN) 5-325 MG per tablet   Oral   Take 1 tablet by mouth every 6 (six) hours as needed for pain (migraine).         . pantoprazole (PROTONIX) 40 MG tablet   Oral   Take 40 mg by mouth daily.         . ranitidine (ZANTAC) 150 MG tablet   Oral   Take 150 mg by mouth daily as needed for heartburn.          Marland Kitchen  albuterol (PROVENTIL HFA;VENTOLIN HFA) 108 (90 BASE) MCG/ACT inhaler   Inhalation   Inhale 2 puffs into the lungs every 6 (six) hours as needed. For shortness of breath   8.5 g   1   . estradiol (VIVELLE-DOT) 0.1 MG/24HR   Transdermal   Place 1 patch onto the skin 2 (two) times a week. Change on Thursdays and Sundays          BP 152/95  Pulse 67  Temp(Src) 98 F (36.7 C) (Oral)  Resp 16  SpO2 100% Physical Exam  Nursing note and vitals reviewed. Constitutional: She is oriented to person, place, and time. She appears well-developed and well-nourished. No distress.  HENT:  Head: Normocephalic and atraumatic.  Eyes: Conjunctivae and  EOM are normal.  Neck: Normal range of motion. Neck supple.  No meningeal signs  Cardiovascular: Normal rate, regular rhythm, normal heart sounds and intact distal pulses.  Exam reveals no gallop and no friction rub.   No murmur heard. Pulmonary/Chest: Effort normal and breath sounds normal. No respiratory distress. She has no wheezes. She has no rales. She exhibits no tenderness.  Abdominal: Soft. Bowel sounds are normal. She exhibits no distension. There is no tenderness. There is no rebound and no guarding.  suprapubic  Genitourinary:  No external lesions. White diischarge in vaginal canal. No CMT. No adnexal tenderness. Chaperone present.   Musculoskeletal: Normal range of motion. She exhibits no edema and no tenderness.  Neurological: She is alert and oriented to person, place, and time. No cranial nerve deficit.  Skin: Skin is warm and dry. No rash noted. She is not diaphoretic. No erythema.  Psychiatric: She has a normal mood and affect.    ED Course  Procedures (including critical care time) Labs Reviewed  URINALYSIS, ROUTINE W REFLEX MICROSCOPIC - Abnormal; Notable for the following:    APPearance HAZY (*)    All other components within normal limits  WET PREP, GENITAL  GC/CHLAMYDIA PROBE AMP  CBC WITH DIFFERENTIAL  POCT PREGNANCY, URINE  POCT I-STAT, CHEM 8   No results found. 1. Dysuria     MDM  Pt afebrile with no CVA tenderness. Pelvic exam is benign. Will get basic labs, UA and re-evaluate.  9:59 AM UA shows no sign of UTI. Wet prep shows no clue cells. Lab work unconcerning. Will d/c pt home pending cx. No abx at this time. Discussed plan with Dr. Fredderick Phenix who is in agreement.   Glade Nurse, PA-C 06/10/13 1702

## 2013-06-10 NOTE — Discharge Instructions (Signed)
1. The urinalysis shows no sign of a urinary tract infection. A culture will be completed as well to see if there is any bacteria in your urine. If the culture is positive, you will receive a phone call and a prescription at that time. 2. SEEK IMMEDIATE MEDICAL CARE IF:  Back pain develops.  A fever develops.  There is nausea (feeling sick to your stomach) or vomiting (throwing up).  Problems are no better with medications or are getting worse.

## 2013-06-10 NOTE — ED Notes (Signed)
Pt reports that since Thursday she has had a burning sensation when she urinates. Also reports lower back pain. Reports vaginal discharge when symptoms first started.

## 2013-06-11 LAB — GC/CHLAMYDIA PROBE AMP
CT Probe RNA: NEGATIVE
GC Probe RNA: NEGATIVE

## 2013-06-11 NOTE — ED Provider Notes (Signed)
Medical screening examination/treatment/procedure(s) were performed by non-physician practitioner and as supervising physician I was immediately available for consultation/collaboration.   Rolan Bucco, MD 06/11/13 [phone]

## 2013-06-12 ENCOUNTER — Ambulatory Visit: Payer: Self-pay | Admitting: Adult Health

## 2013-08-10 ENCOUNTER — Emergency Department (HOSPITAL_COMMUNITY)
Admission: EM | Admit: 2013-08-10 | Discharge: 2013-08-10 | Disposition: A | Discharge status: Home / Self Care | Payer: Medicaid Other | Attending: Emergency Medicine | Admitting: Emergency Medicine

## 2013-08-10 ENCOUNTER — Encounter (HOSPITAL_COMMUNITY): Payer: Self-pay | Admitting: Emergency Medicine

## 2013-08-10 DIAGNOSIS — J45909 Unspecified asthma, uncomplicated: Secondary | ICD-10-CM | POA: Insufficient documentation

## 2013-08-10 DIAGNOSIS — R1013 Epigastric pain: Secondary | ICD-10-CM

## 2013-08-10 DIAGNOSIS — K219 Gastro-esophageal reflux disease without esophagitis: Secondary | ICD-10-CM | POA: Insufficient documentation

## 2013-08-10 DIAGNOSIS — F17200 Nicotine dependence, unspecified, uncomplicated: Secondary | ICD-10-CM | POA: Insufficient documentation

## 2013-08-10 DIAGNOSIS — Z79899 Other long term (current) drug therapy: Secondary | ICD-10-CM | POA: Insufficient documentation

## 2013-08-10 DIAGNOSIS — G43909 Migraine, unspecified, not intractable, without status migrainosus: Secondary | ICD-10-CM | POA: Insufficient documentation

## 2013-08-10 MED ORDER — PANTOPRAZOLE SODIUM 40 MG IV SOLR
40.0000 mg | Freq: Once | INTRAVENOUS | Status: AC
  Administered 2013-08-10: 40 mg via INTRAVENOUS
  Filled 2013-08-10: qty 40

## 2013-08-10 MED ORDER — GI COCKTAIL ~~LOC~~
30.0000 mL | Freq: Once | ORAL | Status: AC
  Administered 2013-08-10: 30 mL via ORAL
  Filled 2013-08-10: qty 30

## 2013-08-10 MED ORDER — ONDANSETRON 4 MG PO TBDP
4.0000 mg | ORAL_TABLET | Freq: Once | ORAL | Status: AC
  Administered 2013-08-10: 4 mg via ORAL
  Filled 2013-08-10: qty 1

## 2013-08-10 MED ORDER — ONDANSETRON 4 MG PO TBDP: 4.0000 mg | ORAL_TABLET | Freq: Three times a day (TID) | ORAL | Status: DC | PRN

## 2013-08-10 NOTE — ED Notes (Signed)
Pt has recently been out of her protonix and woke up this am and has vomiting twice. Pt c/o esophagus and stomach burning.

## 2013-08-10 NOTE — ED Provider Notes (Signed)
CSN: [id number]     Arrival date & time 08/10/13  0350 History   First MD Initiated Contact with Patient 08/10/13 0414     Chief Complaint  Patient presents with  . Emesis   (Consider location/radiation/quality/duration/timing/severity/associated sxs/prior Treatment) Patient is a 37 y.o. female presenting with vomiting. The history is provided by the patient.  Emesis Associated symptoms: abdominal pain   Associated symptoms: no headaches    patient with 2 day history of epigastric discomfort feels like a knot. Pain at worst 8/10 associated with burning sensation up into the chest and esophagus. Vomited twice this evening associated with nausea. Pain is burning and sharp in nature. Nonradiating improve some with Nexium. Symptoms are very typical for her reflux disease. She had been treated with protonic since the past is followed by [facility] patient has not had an upper endoscopy.  Past Medical History  Diagnosis Date  . GERD (gastroesophageal reflux disease)   . Asthma   . Migraines   . IBS (irritable bowel syndrome)    Past Surgical History  Procedure Laterality Date  . Cesarean section    . Abdominal hysterectomy      right SOO   Family History  Problem Relation Age of Onset  . Stroke Mother   . Cancer Father     ?esophageal  . Stroke Father   . Colon cancer Neg Hx   . Lung cancer Maternal Grandfather    History  Substance Use Topics  . Smoking status: Current Every Day Smoker -- 0.00 packs/day    Types: Cigarettes  . Smokeless tobacco: Not on file     Comment: cut back to 1/3 pack per day  . Alcohol Use: No   OB History   Grav Para Term Preterm Abortions TAB SAB Ect Mult Living                 Review of Systems  Constitutional: Negative for fever.  HENT: Negative for congestion and neck pain.   Eyes: Negative for redness.  Cardiovascular: Positive for chest pain.  Gastrointestinal: Positive for nausea, vomiting and abdominal pain.   Genitourinary: Negative for dysuria.  Musculoskeletal: Negative for back pain.  Neurological: Negative for headaches.  Hematological: Does not bruise/bleed easily.  Psychiatric/Behavioral: Negative for confusion.    Allergies  Metronidazole and Advil  Home Medications   Current Outpatient Rx  Name  Route  Sig  Dispense  Refill  . albuterol (PROVENTIL HFA;VENTOLIN HFA) 108 (90 BASE) MCG/ACT inhaler   Inhalation   Inhale 2 puffs into the lungs every 6 (six) hours as needed. For shortness of breath   8.5 g   1   . Aspirin-Salicylamide-Caffeine (BC HEADACHE POWDER PO)   Oral   Take 1 packet by mouth 5 (five) times daily as needed. For headache         . ranitidine (ZANTAC) 150 MG tablet   Oral   Take 150 mg by mouth daily as needed for heartburn.          . estradiol (VIVELLE-DOT) 0.1 MG/24HR   Transdermal   Place 1 patch onto the skin 2 (two) times a week. Change on Thursdays and Sundays         . HYDROcodone-acetaminophen (NORCO/VICODIN) 5-325 MG per tablet   Oral   Take 1 tablet by mouth every 6 (six) hours as needed for pain (migraine).         . ondansetron (ZOFRAN ODT) 4 MG disintegrating tablet   Oral  Take 1 tablet (4 mg total) by mouth every 8 (eight) hours as needed.   12 tablet   0   . pantoprazole (PROTONIX) 40 MG tablet   Oral   Take 40 mg by mouth daily.          BP 155/109  Pulse 68  Temp(Src) 97.4 F (36.3 C) (Oral)  Ht 5\' 6"  (1.676 m)  Wt 183 lb (83.008 kg)  BMI 29.55 kg/m2  SpO2 97% Physical Exam  Nursing note and vitals reviewed. Constitutional: She is oriented to person, place, and time. She appears well-developed and well-nourished. No distress.  HENT:  Head: Normocephalic and atraumatic.  Mouth/Throat: Oropharynx is clear and moist.  Eyes: Conjunctivae and EOM are normal. Pupils are equal, round, and reactive to light.  Neck: Normal range of motion.  Cardiovascular: Normal rate, regular rhythm and normal heart sounds.   No  murmur heard. Pulmonary/Chest: Effort normal and breath sounds normal. No respiratory distress.  Abdominal: Soft. Bowel sounds are normal. There is no tenderness.  Musculoskeletal: Normal range of motion.  Neurological: She is alert and oriented to person, place, and time. No cranial nerve deficit. She exhibits normal muscle tone. Coordination normal.  Skin: Skin is warm. No rash noted.    ED Course  Procedures (including critical care time) Labs Review Labs Reviewed - No data to display Imaging Review No results found.  MDM   1. Epigastric pain   2. GERD (gastroesophageal reflux disease)    Patient with history of reflux disease followed by [facility]. Patient had been on proton except recently switched her self to Nexium. Continue to have symptoms improved here with GI cocktail proton except and Zofran. Will discharge home with anti-nausea medicine patient continue her Nexium recommend followup with her regular Dr. in the next few days consideration for referral to GI medicine an upper endoscopy to be discussed with her record Dr. Barbaraann Faster abdomen is soft nontender no acute abdominal process.   Shelda Jakes, MD 08/10/13 [phone]

## 2013-08-10 NOTE — Discharge Instructions (Signed)
Continue take your Nexium. Takes Zofran as needed for nausea. Make an appointment to followup with your record Dr. would recommend referral by them to GI medicine for consideration for upper endoscopy. Return for any new or worse symptoms.

## 2013-09-05 ENCOUNTER — Encounter: Payer: Self-pay | Admitting: Adult Health

## 2013-09-05 ENCOUNTER — Ambulatory Visit (INDEPENDENT_AMBULATORY_CARE_PROVIDER_SITE_OTHER): Payer: Medicaid Other | Admitting: Adult Health

## 2013-09-05 VITALS — BP 136/86 | Ht 66.0 in | Wt 190.0 lb

## 2013-09-05 DIAGNOSIS — N951 Menopausal and female climacteric states: Secondary | ICD-10-CM

## 2013-09-05 DIAGNOSIS — R232 Flushing: Secondary | ICD-10-CM

## 2013-09-05 DIAGNOSIS — R300 Dysuria: Secondary | ICD-10-CM

## 2013-09-05 DIAGNOSIS — Z9223 Personal history of estrogen therapy: Secondary | ICD-10-CM

## 2013-09-05 HISTORY — DX: Personal history of estrogen therapy: Z92.23

## 2013-09-05 HISTORY — DX: Flushing: R23.2

## 2013-09-05 LAB — POCT URINALYSIS DIPSTICK
Blood, UA: NEGATIVE
Glucose, UA: NEGATIVE
Ketones, UA: NEGATIVE
Leukocytes, UA: NEGATIVE
Nitrite, UA: NEGATIVE
Protein, UA: NEGATIVE

## 2013-09-05 NOTE — Patient Instructions (Addendum)
Continue patch Follow up prn Hormone Therapy At menopause, your body begins making less estrogen and progesterone hormones. This causes the body to stop having menstrual periods. This is because estrogen and progesterone hormones control your periods and menstrual cycle. A lack of estrogen may cause symptoms such as:  Hot flushes (or hot flashes).  Vaginal dryness.  Dry skin.  Loss of sex drive.  Risk of bone loss (osteoporosis). When this happens, you may choose to take hormone therapy to get back the estrogen lost during menopause. When the hormone estrogen is given alone, it is usually referred to as ET (Estrogen Therapy). When the hormone progestin is combined with estrogen, it is generally called HT (Hormone Therapy). This was formerly known as hormone replacement therapy (HRT). Your caregiver can help you make a decision on what will be best for you. The decision to use HT seems to change often as new studies are done. Many studies do not agree on the benefits of hormone replacement therapy. LIKELY BENEFITS OF HT INCLUDE PROTECTION FROM:  Hot Flushes (also called hot flashes) - A hot flush is a sudden feeling of heat that spreads over the face and body. The skin may redden like a blush. It is connected with sweats and sleep disturbance. Women going through menopause may have hot flushes a few times a month or several times per day depending on the woman.  Osteoporosis (bone loss)- Estrogen helps guard against bone loss. After menopause, a woman's bones slowly lose calcium and become weak and brittle. As a result, bones are more likely to break. The hip, wrist, and spine are affected most often. Hormone therapy can help slow bone loss after menopause. Weight bearing exercise and taking calcium with vitamin D also can help prevent bone loss. There are also medications that your caregiver can prescribe that can help prevent osteoporosis.  Vaginal Dryness - Loss of estrogen causes changes in  the vagina. Its lining may become thin and dry. These changes can cause pain and bleeding during sexual intercourse. Dryness can also lead to infections. This can cause burning and itching. (Vaginal estrogen treatment can help relieve pain, itching, and dryness.)  Urinary Tract Infections are more common after menopause because of lack of estrogen. Some women also develop urinary incontinence because of low estrogen levels in the vagina and bladder.  Possible other benefits of estrogen include a positive effect on mood and short-term memory in women. RISKS AND COMPLICATIONS  Using estrogen alone without progesterone causes the lining of the uterus to grow. This increases the risk of lining of the uterus (endometrial) cancer. Your caregiver should give another hormone called progestin if you have a uterus.  Women who take combined (estrogen and progestin) HT appear to have an increased risk of breast cancer. The risk appears to be small, but increases throughout the time that HT is taken.  Combined therapy also makes the breast tissue slightly denser which makes it harder to read mammograms (breast X-rays).  Combined, estrogen and progesterone therapy can be taken together every day, in which case there may be spotting of blood. HT therapy can be taken cyclically in which case you will have menstrual periods. Cyclically means HT is taken for a set amount of days, then not taken, then this process is repeated.  HT may increase the risk of stroke, heart attack, breast cancer and forming blood clots in your leg.  Transdermal estrogen (estrogen that is absorbed through the skin with a patch or a cream) may  have more positive results with:  Cholesterol.  Blood pressure.  Blood clots. Having the following conditions may indicate you should not have HT:  Endometrial cancer.  Liver disease.  Breast cancer.  Heart disease.  History of blood clots.  Stroke. TREATMENT   If you choose to  take HT and have a uterus, usually estrogen and progestin are prescribed.  Your caregiver will help you decide the best way to take the medications.  Possible ways to take estrogen include:  Pills.  Patches.  Gels.  Sprays.  Vaginal estrogen cream, rings and tablets.  It is best to take the lowest dose possible that will help your symptoms and take them for the shortest period of time that you can.  Hormone therapy can help relieve some of the problems (symptoms) that affect women at menopause. Before making a decision about HT, talk to your caregiver about what is best for you. Be well informed and comfortable with your decisions. HOME CARE INSTRUCTIONS   Follow your caregivers advice when taking the medications.  A Pap test is done to screen for cervical cancer.  The first Pap test should be done at age 44.  Between ages 54 and 42, Pap tests are repeated every 2 years.  Beginning at age 23, you are advised to have a Pap test every 3 years as long as your past 3 Pap tests have been normal.  Some women have medical problems that increase the chance of getting cervical cancer. Talk to your caregiver about these problems. It is especially important to talk to your caregiver if a new problem develops soon after your last Pap test. In these cases, your caregiver may recommend more frequent screening and Pap tests.  The above recommendations are the same for women who have or have not gotten the vaccine for HPV (Human Papillomavirus).  If you had a hysterectomy for a problem that was not a cancer or a condition that could lead to cancer, then you no longer need Pap tests. However, even if you no longer need a Pap test, a regular exam is a good idea to make sure no other problems are starting.   If you are between ages 55 and 77, and you have had normal Pap tests going back 10 years, you no longer need Pap tests. However, even if you no longer need a Pap test, a regular exam is a  good idea to make sure no other problems are starting.   If you have had past treatment for cervical cancer or a condition that could lead to cancer, you need Pap tests and screening for cancer for at least 20 years after your treatment.  If Pap tests have been discontinued, risk factors (such as a new sexual partner) need to be re-assessed to determine if screening should be resumed.  Some women may need screenings more often if they are at high risk for cervical cancer.  Get mammograms done as per the advice of your caregiver. SEEK IMMEDIATE MEDICAL CARE IF:  You develop abnormal vaginal bleeding.  You have pain or swelling in your legs, shortness of breath, or chest pain.  You develop dizziness or headaches.  You have lumps or changes in your breasts or armpits.  You have slurred speech.  You develop weakness or numbness of your arms or legs.  You have pain, burning, or bleeding when urinating.  You develop abdominal pain. Document Released: 08/15/2003 Document Revised: 02/08/2012 Document Reviewed: 12/03/2010 [facility] Patient Information 2014 [place], [place].

## 2013-09-05 NOTE — Progress Notes (Signed)
Subjective:     Patient ID: Purvis Sheffield, female   DOB: [birth year], 37 y.o.   MRN: [id number]  HPI Eylin is a 37 year old white female in for follow up of using vivelle patch for hot flashes and they work but her insurance does not cover and she is paying out of pocket.and she has been off for weeks and flashing.Has had some burning with urination seems worse with constipation.See GI in follow up  Review of Systems See HPI Reviewed past medical,surgical, social and family history. Reviewed medications and allergies.     Objective:   Physical Exam BP 136/86  Ht 5\' 6"  (1.676 m)  Wt 190 lb (86.183 kg)  BMI 30.68 kg/m2, urine negative,   See HPI, will continue the patch  Assessment:     Estrogen therapy   Hot flashes  Burning with urination  Plan:     Review handout on estrogen therapy Continue the patch, will give sample of minivelle  Number of samples 8  Lot number [id number]   Exp date 4/16   Follow up prn

## 2013-09-13 ENCOUNTER — Emergency Department (HOSPITAL_COMMUNITY)
Admission: EM | Admit: 2013-09-13 | Discharge: 2013-09-13 | Disposition: A | Discharge status: Home / Self Care | Payer: Medicaid Other | Attending: Emergency Medicine | Admitting: Emergency Medicine

## 2013-09-13 ENCOUNTER — Telehealth: Payer: Self-pay | Admitting: *Deleted

## 2013-09-13 ENCOUNTER — Encounter (HOSPITAL_COMMUNITY): Payer: Self-pay | Admitting: Emergency Medicine

## 2013-09-13 ENCOUNTER — Emergency Department (HOSPITAL_COMMUNITY): Payer: Medicaid Other

## 2013-09-13 DIAGNOSIS — F17200 Nicotine dependence, unspecified, uncomplicated: Secondary | ICD-10-CM | POA: Insufficient documentation

## 2013-09-13 DIAGNOSIS — K219 Gastro-esophageal reflux disease without esophagitis: Secondary | ICD-10-CM | POA: Insufficient documentation

## 2013-09-13 DIAGNOSIS — R109 Unspecified abdominal pain: Secondary | ICD-10-CM

## 2013-09-13 DIAGNOSIS — Z8619 Personal history of other infectious and parasitic diseases: Secondary | ICD-10-CM | POA: Insufficient documentation

## 2013-09-13 DIAGNOSIS — Z79899 Other long term (current) drug therapy: Secondary | ICD-10-CM | POA: Insufficient documentation

## 2013-09-13 DIAGNOSIS — Z3202 Encounter for pregnancy test, result negative: Secondary | ICD-10-CM | POA: Insufficient documentation

## 2013-09-13 DIAGNOSIS — Z8742 Personal history of other diseases of the female genital tract: Secondary | ICD-10-CM | POA: Insufficient documentation

## 2013-09-13 DIAGNOSIS — Z8679 Personal history of other diseases of the circulatory system: Secondary | ICD-10-CM | POA: Insufficient documentation

## 2013-09-13 DIAGNOSIS — Z9223 Personal history of estrogen therapy: Secondary | ICD-10-CM | POA: Insufficient documentation

## 2013-09-13 DIAGNOSIS — J45909 Unspecified asthma, uncomplicated: Secondary | ICD-10-CM | POA: Insufficient documentation

## 2013-09-13 DIAGNOSIS — R1084 Generalized abdominal pain: Secondary | ICD-10-CM | POA: Insufficient documentation

## 2013-09-13 DIAGNOSIS — R112 Nausea with vomiting, unspecified: Secondary | ICD-10-CM | POA: Insufficient documentation

## 2013-09-13 DIAGNOSIS — K5900 Constipation, unspecified: Secondary | ICD-10-CM | POA: Insufficient documentation

## 2013-09-13 LAB — COMPREHENSIVE METABOLIC PANEL
ALT: 9 U/L (ref 0–35)
AST: 14 U/L (ref 0–37)
Albumin: 4.3 g/dL (ref 3.5–5.2)
Alkaline Phosphatase: 45 U/L (ref 39–117)
BUN: 8 mg/dL (ref 6–23)
CO2: 26 mEq/L (ref 19–32)
Calcium: 10 mg/dL (ref 8.4–10.5)
Chloride: 102 mEq/L (ref 96–112)
Creatinine, Ser: 0.75 mg/dL (ref 0.50–1.10)
GFR calc Af Amer: >90 mL/min (ref >90)
GFR calc non Af Amer: >90 mL/min (ref >90)
Glucose, Bld: 87 mg/dL (ref 70–99)
Potassium: 3.3 mEq/L — ABNORMAL LOW (ref 3.5–5.1)
Sodium: 139 mEq/L (ref 135–145)
Total Bilirubin: 0.3 mg/dL (ref 0.3–1.2)
Total Protein: 7.7 g/dL (ref 6.0–8.3)

## 2013-09-13 LAB — URINALYSIS, ROUTINE W REFLEX MICROSCOPIC
Bilirubin Urine: NEGATIVE
Glucose, UA: NEGATIVE mg/dL
Hgb urine dipstick: NEGATIVE
Ketones, ur: NEGATIVE mg/dL
Leukocytes, UA: NEGATIVE
Nitrite: NEGATIVE
Protein, ur: NEGATIVE mg/dL
Specific Gravity, Urine: >1.03 — ABNORMAL HIGH (ref 1.005–1.030)
Urobilinogen, UA: 0.2 mg/dL (ref 0.0–1.0)
pH: 5.5 (ref 5.0–8.0)

## 2013-09-13 LAB — CBC WITH DIFFERENTIAL/PLATELET
Basophils Absolute: 0 10*3/uL (ref 0.0–0.1)
Basophils Relative: 0 % (ref 0–1)
Eosinophils Absolute: 0.1 10*3/uL (ref 0.0–0.7)
Eosinophils Relative: 1 % (ref 0–5)
HCT: 42.7 % (ref 36.0–46.0)
Hemoglobin: 14.8 g/dL (ref 12.0–15.0)
Lymphocytes Relative: 14 % (ref 12–46)
Lymphs Abs: 1.3 10*3/uL (ref 0.7–4.0)
MCH: 32 pg (ref 26.0–34.0)
MCHC: 34.7 g/dL (ref 30.0–36.0)
MCV: 92.4 fL (ref 78.0–100.0)
Monocytes Absolute: 0.4 10*3/uL (ref 0.1–1.0)
Monocytes Relative: 5 % (ref 3–12)
Neutro Abs: 7 10*3/uL (ref 1.7–7.7)
Neutrophils Relative %: 80 % — ABNORMAL HIGH (ref 43–77)
Platelets: 178 10*3/uL (ref 150–400)
RBC: 4.62 MIL/uL (ref 3.87–5.11)
RDW: 12.5 % (ref 11.5–15.5)
WBC: 8.8 10*3/uL (ref 4.0–10.5)

## 2013-09-13 LAB — PREGNANCY, URINE: Preg Test, Ur: NEGATIVE

## 2013-09-13 MED ORDER — HYDROCODONE-ACETAMINOPHEN 5-325 MG PO TABS: ORAL_TABLET | ORAL | Status: DC

## 2013-09-13 MED ORDER — HYDROMORPHONE HCL PF 1 MG/ML IJ SOLN
1.0000 mg | Freq: Once | INTRAMUSCULAR | Status: AC
  Administered 2013-09-13: 1 mg via INTRAVENOUS
  Filled 2013-09-13: qty 1

## 2013-09-13 MED ORDER — IOHEXOL 300 MG/ML  SOLN
100.0000 mL | Freq: Once | INTRAMUSCULAR | Status: AC | PRN
  Administered 2013-09-13: 100 mL via INTRAVENOUS

## 2013-09-13 MED ORDER — DICYCLOMINE HCL 20 MG PO TABS: ORAL_TABLET | ORAL | Status: DC

## 2013-09-13 MED ORDER — ONDANSETRON HCL 4 MG/2ML IJ SOLN
4.0000 mg | Freq: Once | INTRAMUSCULAR | Status: AC
  Administered 2013-09-13: 4 mg via INTRAVENOUS
  Filled 2013-09-13: qty 2

## 2013-09-13 MED ORDER — SODIUM CHLORIDE 0.9 % IV BOLUS (SEPSIS)
1000.0000 mL | Freq: Once | INTRAVENOUS | Status: AC
  Administered 2013-09-13: 1000 mL via INTRAVENOUS

## 2013-09-13 NOTE — Discharge Instructions (Signed)
Follow up with your md next week for recheck °

## 2013-09-13 NOTE — Telephone Encounter (Signed)
Tried to call pt- LMOM 

## 2013-09-13 NOTE — ED Provider Notes (Signed)
CSN: [id number]     Arrival date & time 09/13/13  4696 History  This chart was scribed for Benny Lennert, MD by Quintella Reichert, ED scribe.  This patient was seen in room APA02/APA02 and the patient's care was started at 10:06 AM.  Chief Complaint  Patient presents with  . Emesis  . Abdominal Pain    Patient is a 37 y.o. female presenting with abdominal pain. The history is provided by the patient. No language interpreter was used.  Abdominal Pain Pain location:  Generalized Pain severity:  Moderate Timing:  Constant Progression:  Worsening Chronicity:  New Context comment:  No BM in 6 days Worsened by:  Palpation Associated symptoms: constipation, nausea and vomiting   Associated symptoms: no chest pain, no cough, no diarrhea, no fatigue, no fever and no hematuria   Risk factors: multiple surgeries   Risk factors comment:  IBS   HPI Comments: RAYNA BRENNER is a 37 y.o. female with h/o IBS who presents to the Emergency Department complaining of constipation, abdominal pain and vomiting.  Pt states she has not moved her bowels in 6 days.  This morning she developed constant moderate generalized abdominal pain, nausea and vomiting.  Pt admits to prior h/o similar symptoms but states this is the longest she has gone without BM.  She denies fever.   Past Medical History  Diagnosis Date  . GERD (gastroesophageal reflux disease)   . Asthma   . Migraines   . IBS (irritable bowel syndrome)   . Constipation   . Hot flashes 09/05/2013    Uses patch  . H/O estrogen therapy 09/05/2013  . Herpes simplex without mention of complication     Past Surgical History  Procedure Laterality Date  . Cesarean section    . Abdominal hysterectomy      right SOO    Family History  Problem Relation Age of Onset  . Stroke Mother   . Cancer Father     ?esophageal  . Stroke Father   . Colon cancer Neg Hx   . Lung cancer Maternal Grandfather     History  Substance Use Topics  .  Smoking status: Current Every Day Smoker -- 1.50 packs/day for 21 years    Types: Cigarettes  . Smokeless tobacco: Never Used     Comment: cut back to 1/3 pack per day  . Alcohol Use: No    OB History   Grav Para Term Preterm Abortions TAB SAB Ect Mult Living   4 4        4       Review of Systems  Constitutional: Negative for fever, appetite change and fatigue.  HENT: Negative for congestion, ear discharge and sinus pressure.   Eyes: Negative for discharge.  Respiratory: Negative for cough.   Cardiovascular: Negative for chest pain.  Gastrointestinal: Positive for nausea, vomiting, abdominal pain and constipation. Negative for diarrhea.  Genitourinary: Negative for frequency and hematuria.  Musculoskeletal: Negative for back pain.  Skin: Negative for rash.  Neurological: Negative for seizures and headaches.  Psychiatric/Behavioral: Negative for hallucinations.     Allergies  Metronidazole and Advil  Home Medications   Current Outpatient Rx  Name  Route  Sig  Dispense  Refill  . albuterol (PROVENTIL HFA;VENTOLIN HFA) 108 (90 BASE) MCG/ACT inhaler   Inhalation   Inhale 2 puffs into the lungs every 6 (six) hours as needed. For shortness of breath   8.5 g   1   . Aspirin-Salicylamide-Caffeine (BC HEADACHE  POWDER PO)   Oral   Take 1 packet by mouth every 4 (four) hours as needed. For headache         . HYDROcodone-acetaminophen (NORCO/VICODIN) 5-325 MG per tablet   Oral   Take 1 tablet by mouth every 6 (six) hours as needed for pain (migraine).         . ondansetron (ZOFRAN-ODT) 4 MG disintegrating tablet   Oral   Take 4-8 mg by mouth every 8 (eight) hours as needed for nausea.         . ranitidine (ZANTAC) 150 MG tablet   Oral   Take 150 mg by mouth 2 (two) times daily as needed for heartburn.          . estradiol (VIVELLE-DOT) 0.1 MG/24HR   Transdermal   Place 1 patch onto the skin 2 (two) times a week. Change on Thursdays and Sundays          BP  133/86  Pulse 63  Temp(Src) 97.7 F (36.5 C) (Oral)  Resp 18  Ht 5\' 6"  (1.676 m)  Wt 183 lb (83.008 kg)  BMI 29.55 kg/m2  SpO2 99%  Physical Exam  Nursing note and vitals reviewed. Constitutional: She is oriented to person, place, and time. She appears well-developed.  HENT:  Head: Normocephalic.  Eyes: Conjunctivae and EOM are normal. No scleral icterus.  Neck: Neck supple. No thyromegaly present.  Cardiovascular: Normal rate and regular rhythm.  Exam reveals no gallop and no friction rub.   No murmur heard. Pulmonary/Chest: No stridor. She has no wheezes. She has no rales. She exhibits no tenderness.  Abdominal: Soft. She exhibits no distension. There is generalized tenderness (mild). There is no rebound.  Musculoskeletal: Normal range of motion. She exhibits no edema.  Lymphadenopathy:    She has no cervical adenopathy.  Neurological: She is oriented to person, place, and time. She exhibits normal muscle tone. Coordination normal.  Skin: No rash noted. No erythema.  Psychiatric: She has a normal mood and affect. Her behavior is normal.    ED Course  Procedures (including critical care time)  DIAGNOSTIC STUDIES: Oxygen Saturation is 99% on room air, normal by my interpretation.    COORDINATION OF CARE: 10:09 AM: Discussed treatment plan which includes pain medication, anti-emetics, and labs.  Pt expressed understanding and agreed to plan.   Labs Review Labs Reviewed  URINALYSIS, ROUTINE W REFLEX MICROSCOPIC - Abnormal; Notable for the following:    APPearance HAZY (*)    Specific Gravity, Urine >1.030 (*)    All other components within normal limits  CBC WITH DIFFERENTIAL - Abnormal; Notable for the following:    Neutrophils Relative % 80 (*)    All other components within normal limits  COMPREHENSIVE METABOLIC PANEL - Abnormal; Notable for the following:    Potassium 3.3 (*)    All other components within normal limits  PREGNANCY, URINE   Imaging Review No  results found.  EKG Interpretation   None       MDM  No diagnosis found.     The chart was scribed for me under my direct supervision.  I personally performed the history, physical, and medical decision making and all procedures in the evaluation of this patient.Benny Lennert, MD 09/13/13 [phone]

## 2013-09-13 NOTE — Telephone Encounter (Signed)
Per epic, pt was seen at the ED today.

## 2013-09-13 NOTE — ED Notes (Signed)
Pt has appt with dr Jena Gauss office at 8am per pt

## 2013-09-13 NOTE — ED Notes (Signed)
Pt reports no bm in 6 days.  C/O vomiting and abd pain since this morning.

## 2013-09-13 NOTE — Telephone Encounter (Signed)
Pt called stating she has not had a bm in 6 days, pt has appt. Tomorrow with RMR pt states she is having cramps, pt states when she takes dulcolax it makes her throw up. Pt wants to know if she needs to go to ER. Please advise [phone]

## 2013-09-14 ENCOUNTER — Encounter (INDEPENDENT_AMBULATORY_CARE_PROVIDER_SITE_OTHER): Payer: Self-pay

## 2013-09-14 ENCOUNTER — Ambulatory Visit (INDEPENDENT_AMBULATORY_CARE_PROVIDER_SITE_OTHER): Payer: Medicaid Other | Admitting: Gastroenterology

## 2013-09-14 ENCOUNTER — Encounter: Payer: Self-pay | Admitting: Gastroenterology

## 2013-09-14 VITALS — BP 117/77 | HR 82 | Temp 97.5°F | Ht 66.0 in | Wt 186.0 lb

## 2013-09-14 DIAGNOSIS — E876 Hypokalemia: Secondary | ICD-10-CM

## 2013-09-14 DIAGNOSIS — K5900 Constipation, unspecified: Secondary | ICD-10-CM

## 2013-09-14 DIAGNOSIS — R1319 Other dysphagia: Secondary | ICD-10-CM

## 2013-09-14 DIAGNOSIS — R1310 Dysphagia, unspecified: Secondary | ICD-10-CM

## 2013-09-14 DIAGNOSIS — R1013 Epigastric pain: Secondary | ICD-10-CM

## 2013-09-14 DIAGNOSIS — K219 Gastro-esophageal reflux disease without esophagitis: Secondary | ICD-10-CM

## 2013-09-14 DIAGNOSIS — R1314 Dysphagia, pharyngoesophageal phase: Secondary | ICD-10-CM

## 2013-09-14 MED ORDER — LINACLOTIDE 290 MCG PO CAPS: 290.0000 ug | ORAL_CAPSULE | Freq: Every day | ORAL | Status: DC

## 2013-09-14 MED ORDER — PROMETHAZINE HCL 25 MG PO TABS: 25.0000 mg | ORAL_TABLET | Freq: Four times a day (QID) | ORAL | Status: DC | PRN

## 2013-09-14 MED ORDER — PEG-KCL-NACL-NASULF-NA ASC-C 100 G PO SOLR: 1.0000 | ORAL | Status: DC

## 2013-09-14 MED ORDER — DEXLANSOPRAZOLE 60 MG PO CPDR: 60.0000 mg | DELAYED_RELEASE_CAPSULE | Freq: Every day | ORAL | Status: DC

## 2013-09-14 NOTE — Assessment & Plan Note (Signed)
Chronic, now with worsening constipation but no evidence of obstruction on recent CT. Failed Amitiza in the past. Likely constipation is culprit of decreased po intake, nausea. Miralax purge now. Start Linzess 290 mcg daily. Stop Zofran due to possible med effect of constipation. Colonoscopy with Dr. Jena Gauss in near future due to persistent constipation despite prescriptive measures; also noted non-specific colitis on CT in 2012. Doubt IBD; likely was an infectious etiology. Risks and benefits discussed in detail with stated understanding.   Utilize Phenergan 25 mg IV on call due to marijuana use.

## 2013-09-14 NOTE — Assessment & Plan Note (Signed)
Mild, K 3.3 in ED. Recheck BMP now. May need short supplemental course of potassium.

## 2013-09-14 NOTE — Assessment & Plan Note (Signed)
Tried/failed multiple PPIs. Start Dexilant.

## 2013-09-14 NOTE — Progress Notes (Signed)
cc'd to pcp 

## 2013-09-14 NOTE — Assessment & Plan Note (Signed)
Likely due to uncontrolled GERD, query esophagitis, occult web, ring, or stricture. Dilation at time of EGD.

## 2013-09-14 NOTE — Patient Instructions (Signed)
Stop taking Zofran. I have sent Phenergan to the pharmacy for nausea. DO NOT drive while taking this. Can cause drowsiness.   Start taking Dexilant each morning. This is for reflux. Samples provided and prescription have been sent to the pharmacy.   For relief now: Take 1 capful of Miralax every hour up to 6 doses. 30 minutes after each dose, drink a full glass of water. You can go ahead and take your dose of Linzess now.   Please have blood work done. We are checking your thyroid to see if this could be contributing to constipation.  Stop taking Goody powders:) Only use tylenol products.   Finally, we have scheduled you for a colonoscopy and upper endoscopy with dilation with Dr. Jena Gauss in the near future.  PLEASE CALL if you have no improvement with Miralax.

## 2013-09-14 NOTE — Progress Notes (Signed)
Referring Provider: Donita Brooks, MD Primary Care Physician:  Leo Grosser, MD Primary GI: Dr. Jena Gauss   Chief Complaint  Patient presents with  . Constipation  . Vomiting  . Heartburn    HPI:   Debbie [name] is a 37 year old female presenting today with history of constipation, GERD, esophageal dysphagia, and epigastric pain. Last seen in June 2013 and recommended to proceed with a TCS/EGD. However, she no-showed this appt. Presented to the ED yesterday due to constipation and abdominal pain. CT showed no acute findings, noted rounded soft-tissue density lesions along the nondependent portion of proximal duodenal wall. Question of small polyps. CBC, CMP normal.   Hasn't had a good BM in about a month. 7 days since actual passage of stool from rectum. History of taking Protonix, then adding Zantac. Stopped both due to no improvement with symptoms. Nexium worsened symptoms. Now not on any PPI. Has taken Prilosec in the past as well. Takes Dulcolax, which causes nausea and vomiting. No rectal bleeding. Mucus stool. Has tried Amitiza 24 mcg in the past without improvement. Has to strain a lot. Has tiny balls come out. Feels like she can't eat because the heartburn is so bad. Denies melena. Feels nausea secondary to severe constipation. Epigastric pain, chronic. Takes BC powders every 4 hours for migraines. Eats a couple bites of cheese toast a day, feels so full. Intermittent esophageal and solid food dysphagia. No odynophagia.   Past Medical History  Diagnosis Date  . GERD (gastroesophageal reflux disease)   . Asthma   . Migraines   . IBS (irritable bowel syndrome)   . Constipation   . Hot flashes 09/05/2013    Uses patch  . H/O estrogen therapy 09/05/2013  . Herpes simplex without mention of complication     Past Surgical History  Procedure Laterality Date  . Cesarean section    . Abdominal hysterectomy      right SOO    Current Outpatient Prescriptions  Medication Sig  Dispense Refill  . albuterol (PROVENTIL HFA;VENTOLIN HFA) 108 (90 BASE) MCG/ACT inhaler Inhale 2 puffs into the lungs every 6 (six) hours as needed. For shortness of breath  8.5 g  1  . Aspirin-Salicylamide-Caffeine (BC HEADACHE POWDER PO) Take 1 packet by mouth every 4 (four) hours as needed. For headache      . estradiol (VIVELLE-DOT) 0.1 MG/24HR Place 1 patch onto the skin 2 (two) times a week. Change on Thursdays and Sundays      . ondansetron (ZOFRAN-ODT) 4 MG disintegrating tablet Take 4-8 mg by mouth every 8 (eight) hours as needed for nausea.      . ranitidine (ZANTAC) 150 MG tablet Take 150 mg by mouth 2 (two) times daily as needed for heartburn.        No current facility-administered medications for this visit.    Allergies as of 09/14/2013 - Review Complete 09/14/2013  Allergen Reaction Noted  . Metronidazole Swelling 08/19/2011  . Advil [ibuprofen] Palpitations 08/19/2011    Family History  Problem Relation Age of Onset  . Stroke Mother   . Cancer Father     ?esophageal  . Stroke Father   . Colon cancer Neg Hx   . Lung cancer Maternal Grandfather     History   Social History  . Marital Status: Single    Spouse Name: N/A    Number of Children: 3  . Years of Education: N/A   Occupational History  . unemployed    Social History Main Topics  .  Smoking status: Current Every Day Smoker -- 1.50 packs/day for 21 years    Types: Cigarettes  . Smokeless tobacco: Never Used     Comment: cut back to 1/3 pack per day  . Alcohol Use: No  . Drug Use: Yes    Special: Marijuana     Comment: Marijuana twice a month  . Sexual Activity: Yes    Birth Control/ Protection: Surgical   Other Topics Concern  . None   Social History Narrative  . None    Review of Systems: Gen: +chills/sweats, seeing GYN  CV: Denies chest pain, palpitations, syncope, peripheral edema, and claudication. Resp: wheezing GI: see HPI Derm: Denies rash, itching, dry skin Psych: situational  depression due to not feeling good.  Heme: Denies bruising, bleeding, and enlarged lymph nodes.  Physical Exam: BP 117/77  Pulse 82  Temp(Src) 97.5 F (36.4 C) (Oral)  Ht 5\' 6"  (1.676 m)  Wt 186 lb (84.369 kg)  BMI 30.04 kg/m2 General:   Alert and oriented. No distress noted. Pleasant and cooperative.  Head:  Normocephalic and atraumatic. Eyes:  Conjuctiva clear without scleral icterus. Mouth:  Oral mucosa pink and moist. Good dentition. No lesions. Neck:  Supple, without mass or thyromegaly. Heart:  S1, S2 present without murmurs, rubs, or gallops. Regular rate and rhythm. Abdomen:  +BS, soft, TTP epigastric and lower abdomen and non-distended. No rebound or guarding. No HSM or masses noted. Rectal: no external mass appreciated. Internal exam without fecal impaction. Tight anal sphincter tone, no obvious mass   Msk:  Symmetrical without gross deformities. Normal posture. Extremities:  Without edema. Neurologic:  Alert and  oriented x4;  grossly normal neurologically. Skin:  Intact without significant lesions or rashes. Cervical Nodes:  No significant cervical adenopathy. Psych:  Alert and cooperative. Normal mood and affect.  CT 2012: Wall thickening of the ascending and transverse colon associated  with inflammatory change  CT 2014: 1. No acute findings to explain the patient's given symptoms, other  than the possibility of constipation.  2. Rounded soft tissue density lesions along the nondependent  portion of the proximal duodenal wall. Small polyps can have this  Appearance.  Lab Results  Component Value Date   WBC 8.8 09/13/2013   HGB 14.8 09/13/2013   HCT 42.7 09/13/2013   MCV 92.4 09/13/2013   PLT 178 09/13/2013   Lab Results  Component Value Date   ALT 9 09/13/2013   AST 14 09/13/2013   ALKPHOS 45 09/13/2013   BILITOT 0.3 09/13/2013   Lab Results  Component Value Date   CREATININE 0.75 09/13/2013   BUN 8 09/13/2013   NA 139 09/13/2013   K 3.3*  09/13/2013   CL 102 09/13/2013   CO2 26 09/13/2013

## 2013-09-14 NOTE — Assessment & Plan Note (Addendum)
Chronic in the setting of multiple doses of BC powders. No melena or anemia. Associated nausea and vomiting likely multifactorial with known severe constipation. Needs EGD with dilation due to dysphagia at time of TCS. Risks and benefits discussed. Failed prior PPIs to include Nexium, Protonix, Prilosec. Dexilant samples and prescription provided.   Phenergan 25 mg IV on call due to marijuana.  Also notable, rounded soft tissue density lesions along portion of proximal duodenal wall on CT. Question of polyps? If unable to appreciate on EGD, consider CT enterography vs capsule study.

## 2013-09-20 ENCOUNTER — Encounter (HOSPITAL_COMMUNITY): Payer: Self-pay | Admitting: Pharmacy Technician

## 2013-09-25 ENCOUNTER — Ambulatory Visit (HOSPITAL_COMMUNITY)
Admission: RE | Admit: 2013-09-25 | Discharge: 2013-09-25 | Disposition: A | Discharge status: Home / Self Care | Payer: Medicaid Other | Source: Ambulatory Visit | Attending: Internal Medicine | Admitting: Internal Medicine

## 2013-09-25 ENCOUNTER — Encounter (HOSPITAL_COMMUNITY)
Admission: RE | Disposition: A | Discharge status: Home / Self Care | Payer: Self-pay | Source: Ambulatory Visit | Attending: Internal Medicine

## 2013-09-25 ENCOUNTER — Encounter (HOSPITAL_COMMUNITY): Payer: Self-pay

## 2013-09-25 DIAGNOSIS — R1310 Dysphagia, unspecified: Secondary | ICD-10-CM

## 2013-09-25 DIAGNOSIS — D126 Benign neoplasm of colon, unspecified: Secondary | ICD-10-CM | POA: Insufficient documentation

## 2013-09-25 DIAGNOSIS — K259 Gastric ulcer, unspecified as acute or chronic, without hemorrhage or perforation: Secondary | ICD-10-CM | POA: Insufficient documentation

## 2013-09-25 DIAGNOSIS — K449 Diaphragmatic hernia without obstruction or gangrene: Secondary | ICD-10-CM | POA: Insufficient documentation

## 2013-09-25 DIAGNOSIS — K222 Esophageal obstruction: Secondary | ICD-10-CM | POA: Insufficient documentation

## 2013-09-25 DIAGNOSIS — R1319 Other dysphagia: Secondary | ICD-10-CM

## 2013-09-25 DIAGNOSIS — R1013 Epigastric pain: Secondary | ICD-10-CM

## 2013-09-25 DIAGNOSIS — R198 Other specified symptoms and signs involving the digestive system and abdomen: Secondary | ICD-10-CM | POA: Insufficient documentation

## 2013-09-25 DIAGNOSIS — K5900 Constipation, unspecified: Secondary | ICD-10-CM

## 2013-09-25 HISTORY — PX: ESOPHAGOGASTRODUODENOSCOPY: SHX5428

## 2013-09-25 HISTORY — PX: COLONOSCOPY: SHX5424

## 2013-09-25 SURGERY — COLONOSCOPY
Anesthesia: Moderate Sedation

## 2013-09-25 MED ORDER — PROMETHAZINE HCL 25 MG/ML IJ SOLN
INTRAMUSCULAR | Status: AC
  Filled 2013-09-25: qty 1

## 2013-09-25 MED ORDER — MIDAZOLAM HCL 5 MG/5ML IJ SOLN
INTRAMUSCULAR | Status: DC | PRN
  Administered 2013-09-25: 2 mg via INTRAVENOUS
  Administered 2013-09-25 (×5): 1 mg via INTRAVENOUS

## 2013-09-25 MED ORDER — MIDAZOLAM HCL 5 MG/5ML IJ SOLN
INTRAMUSCULAR | Status: AC
  Filled 2013-09-25: qty 10

## 2013-09-25 MED ORDER — MEPERIDINE HCL 100 MG/ML IJ SOLN
INTRAMUSCULAR | Status: DC | PRN
  Administered 2013-09-25: 50 mg via INTRAVENOUS
  Administered 2013-09-25 (×4): 25 mg via INTRAVENOUS

## 2013-09-25 MED ORDER — MEPERIDINE HCL 100 MG/ML IJ SOLN
INTRAMUSCULAR | Status: AC
  Filled 2013-09-25: qty 2

## 2013-09-25 MED ORDER — SODIUM CHLORIDE 0.9 % IV SOLN: INTRAVENOUS | Status: DC

## 2013-09-25 MED ORDER — ONDANSETRON HCL 4 MG/2ML IJ SOLN
INTRAMUSCULAR | Status: AC
  Filled 2013-09-25: qty 2

## 2013-09-25 MED ORDER — PROMETHAZINE HCL 25 MG/ML IJ SOLN
25.0000 mg | Freq: Once | INTRAMUSCULAR | Status: AC
  Administered 2013-09-25: 25 mg via INTRAVENOUS

## 2013-09-25 MED ORDER — BUTAMBEN-TETRACAINE-BENZOCAINE 2-2-14 % EX AERO
INHALATION_SPRAY | CUTANEOUS | Status: DC | PRN
  Administered 2013-09-25: 1 via TOPICAL

## 2013-09-25 MED ORDER — SODIUM CHLORIDE 0.9 % IJ SOLN
INTRAMUSCULAR | Status: AC
  Filled 2013-09-25: qty 10

## 2013-09-25 MED ORDER — ONDANSETRON HCL 4 MG/2ML IJ SOLN
INTRAMUSCULAR | Status: DC | PRN
  Administered 2013-09-25: 4 mg via INTRAVENOUS

## 2013-09-25 MED ORDER — STERILE WATER FOR IRRIGATION IR SOLN
Status: DC | PRN
  Administered 2013-09-25: 14:00:00

## 2013-09-25 NOTE — Discharge Instructions (Addendum)
Colonoscopy Discharge Instructions  Read the instructions outlined below and refer to this sheet in the next few weeks. These discharge instructions provide you with general information on caring for yourself after you leave the hospital. Your doctor may also give you specific instructions. While your treatment has been planned according to the most current medical practices available, unavoidable complications occasionally occur. If you have any problems or questions after discharge, call Dr. Gala Romney at [phone]. ACTIVITY  You may resume your regular activity, but move at a slower pace for the next 24 hours.   Take frequent rest periods for the next 24 hours.   Walking will help get rid of the air and reduce the bloated feeling in your belly (abdomen).   No driving for 24 hours (because of the medicine (anesthesia) used during the test).    Do not sign any important legal documents or operate any machinery for 24 hours (because of the anesthesia used during the test).  NUTRITION  Drink plenty of fluids.   You may resume your normal diet as instructed by your doctor.   Begin with a light meal and progress to your normal diet. Heavy or fried foods are harder to digest and may make you feel sick to your stomach (nauseated).   Avoid alcoholic beverages for 24 hours or as instructed.  MEDICATIONS  You may resume your normal medications unless your doctor tells you otherwise.  WHAT YOU CAN EXPECT TODAY  Some feelings of bloating in the abdomen.   Passage of more gas than usual.   Spotting of blood in your stool or on the toilet paper.  IF YOU HAD POLYPS REMOVED DURING THE COLONOSCOPY:  No aspirin products for 7 days or as instructed.   No alcohol for 7 days or as instructed.   Eat a soft diet for the next 24 hours.  FINDING OUT THE RESULTS OF YOUR TEST Not all test results are available during your visit. If your test results are not back during the visit, make an appointment  with your caregiver to find out the results. Do not assume everything is normal if you have not heard from your caregiver or the medical facility. It is important for you to follow up on all of your test results.  SEEK IMMEDIATE MEDICAL ATTENTION IF:  You have more than a spotting of blood in your stool.   Your belly is swollen (abdominal distention).   You are nauseated or vomiting.   You have a temperature over 101.   You have abdominal pain or discomfort that is severe or gets worse throughout the day.   EGD Discharge instructions Please read the instructions outlined below and refer to this sheet in the next few weeks. These discharge instructions provide you with general information on caring for yourself after you leave the hospital. Your doctor may also give you specific instructions. While your treatment has been planned according to the most current medical practices available, unavoidable complications occasionally occur. If you have any problems or questions after discharge, please call your doctor. ACTIVITY  You may resume your regular activity but move at a slower pace for the next 24 hours.   Take frequent rest periods for the next 24 hours.   Walking will help expel (get rid of) the air and reduce the bloated feeling in your abdomen.   No driving for 24 hours (because of the anesthesia (medicine) used during the test).   You may shower.   Do not sign any  important legal documents or operate any machinery for 24 hours (because of the anesthesia used during the test).  NUTRITION  Drink plenty of fluids.   You may resume your normal diet.   Begin with a light meal and progress to your normal diet.   Avoid alcoholic beverages for 24 hours or as instructed by your caregiver.  MEDICATIONS  You may resume your normal medications unless your caregiver tells you otherwise.  WHAT YOU CAN EXPECT TODAY  You may experience abdominal discomfort such as a feeling of  fullness or "gas" pains.  FOLLOW-UP  Your doctor will discuss the results of your test with you.  SEEK IMMEDIATE MEDICAL ATTENTION IF ANY OF THE FOLLOWING OCCUR:  Excessive nausea (feeling sick to your stomach) and/or vomiting.   Severe abdominal pain and distention (swelling).   Trouble swallowing.   Temperature over 101 F (37.8 C).   Rectal bleeding or vomiting of blood.    Avoid all BC/aspirin powders//NSAIDs  Information on peptic ulcer disease, constipation and colon polyps provided  Increase Dexilant 60 mg orally twice daily  Soft diet  Will need to schedule an office visit in about 10 weeks.  You will then have a repeat upper endoscopy in the operating room to have your esophagus dilated.  Continue Linzess 290 once daily for constipation.  Further recommendations to follow pending review of pathology report  Constipation, Adult Constipation is when a person has fewer than 3 bowel movements a week; has difficulty having a bowel movement; or has stools that are dry, hard, or larger than normal. As people grow older, constipation is more common. If you try to fix constipation with medicines that make you have a bowel movement (laxatives), the problem may get worse. Long-term laxative use may cause the muscles of the colon to become weak. A low-fiber diet, not taking in enough fluids, and taking certain medicines may make constipation worse. CAUSES   Certain medicines, such as antidepressants, pain medicine, iron supplements, antacids, and water pills.   Certain diseases, such as diabetes, irritable bowel syndrome (IBS), thyroid disease, or depression.   Not drinking enough water.   Not eating enough fiber-rich foods.   Stress or travel.  Lack of physical activity or exercise.  Not going to the restroom when there is the urge to have a bowel movement.  Ignoring the urge to have a bowel movement.  Using laxatives too much. SYMPTOMS   Having fewer than 3  bowel movements a week.   Straining to have a bowel movement.   Having hard, dry, or larger than normal stools.   Feeling full or bloated.   Pain in the lower abdomen.  Not feeling relief after having a bowel movement. DIAGNOSIS  Your caregiver will take a medical history and perform a physical exam. Further testing may be done for severe constipation. Some tests may include:   A barium enema X-ray to examine your rectum, colon, and sometimes, your small intestine.  A sigmoidoscopy to examine your lower colon.  A colonoscopy to examine your entire colon. TREATMENT  Treatment will depend on the severity of your constipation and what is causing it. Some dietary treatments include drinking more fluids and eating more fiber-rich foods. Lifestyle treatments may include regular exercise. If these diet and lifestyle recommendations do not help, your caregiver may recommend taking over-the-counter laxative medicines to help you have bowel movements. Prescription medicines may be prescribed if over-the-counter medicines do not work.  HOME CARE INSTRUCTIONS   Increase dietary  fiber in your diet, such as fruits, vegetables, whole grains, and beans. Limit high-fat and processed sugars in your diet, such as [place] fries, hamburgers, cookies, candies, and soda.   A fiber supplement may be added to your diet if you cannot get enough fiber from foods.   Drink enough fluids to keep your urine clear or pale yellow.   Exercise regularly or as directed by your caregiver.   Go to the restroom when you have the urge to go. Do not hold it.  Only take medicines as directed by your caregiver. Do not take other medicines for constipation without talking to your caregiver first. SEEK IMMEDIATE MEDICAL CARE IF:   You have bright red blood in your stool.   Your constipation lasts for more than 4 days or gets worse.   You have abdominal or rectal pain.   You have thin, pencil-like  stools.  You have unexplained weight loss. MAKE SURE YOU:   Understand these instructions.  Will watch your condition.  Will get help right away if you are not doing well or get worse. Colon Polyps Polyps are lumps of extra tissue growing inside the body. Polyps can grow in the large intestine (colon). Most colon polyps are noncancerous (benign). However, some colon polyps can become cancerous over time. Polyps that are larger than a pea may be harmful. To be safe, caregivers remove and test all polyps. CAUSES  Polyps form when mutations in the genes cause your cells to grow and divide even though no more tissue is needed. RISK FACTORS There are a number of risk factors that can increase your chances of getting colon polyps. They include:  Being older than 50 years.  Family history of colon polyps or colon cancer.  Long-term colon diseases, such as colitis or Crohn disease.  Being overweight.  Smoking.  Being inactive.  Drinking too much alcohol. SYMPTOMS  Most small polyps do not cause symptoms. If symptoms are present, they may include:  Blood in the stool. The stool may look dark red or black.  Constipation or diarrhea that lasts longer than 1 week. DIAGNOSIS People often do not know they have polyps until their caregiver finds them during a regular checkup. Your caregiver can use 4 tests to check for polyps:  Digital rectal exam. The caregiver wears gloves and feels inside the rectum. This test would find polyps only in the rectum.  Barium enema. The caregiver puts a liquid called barium into your rectum before taking X-rays of your colon. Barium makes your colon look white. Polyps are dark, so they are easy to see in the X-ray pictures.  Sigmoidoscopy. A thin, flexible tube (sigmoidoscope) is placed into your rectum. The sigmoidoscope has a light and tiny camera in it. The caregiver uses the sigmoidoscope to look at the last third of your colon.  Colonoscopy. This  test is like sigmoidoscopy, but the caregiver looks at the entire colon. This is the most common method for finding and removing polyps. TREATMENT  Any polyps will be removed during a sigmoidoscopy or colonoscopy. The polyps are then tested for cancer. PREVENTION  To help lower your risk of getting more colon polyps:  Eat plenty of fruits and vegetables. Avoid eating fatty foods.  Do not smoke.  Avoid drinking alcohol.  Exercise every day.  Lose weight if recommended by your caregiver.  Eat plenty of calcium and folate. Foods that are rich in calcium include milk, cheese, and broccoli. Foods that are rich in folate include  chickpeas, kidney beans, and spinach. HOME CARE INSTRUCTIONS Keep all follow-up appointments as directed by your caregiver. You may need periodic exams to check for polyps. SEEK MEDICAL CARE IF: You notice bleeding during a bowel movement. Document Released: 08/12/2004 Document Revised: 02/08/2012 Document Reviewed: 01/26/2012 [facility] Patient Information 2014 [place], [place]. Peptic Ulcer A peptic ulcer is a sore in the lining of in your esophagus (esophageal ulcer), stomach (gastric ulcer), or in the first part of your small intestine (duodenal ulcer). The ulcer causes erosion into the deeper tissue. CAUSES  Normally, the lining of the stomach and the small intestine protects itself from the acid that digests food. The protective lining can be damaged by:  An infection caused by a bacterium called Helicobacter pylori (H. pylori).  Regular use of nonsteroidal anti-inflammatory drugs (NSAIDs), such as ibuprofen or aspirin.  Smoking tobacco. Other risk factors include being older than 50, drinking alcohol excessively, and having a family history of ulcer disease.  SYMPTOMS   Burning pain or gnawing in the area between the chest and the belly button.  Heartburn.  Nausea and vomiting.  Bloating. The pain can be worse on an empty stomach and at night. If the  ulcer results in bleeding, it can cause:  Black, tarry stools.  Vomiting of bright red blood.  Vomiting of coffee ground looking materials. DIAGNOSIS  A diagnosis is usually made based upon your history and an exam. Other tests and procedures may be performed to find the cause of the ulcer. Finding a cause will help determine the best treatment. Tests and procedures may include:  Blood tests, stool tests, or breath tests to check for the bacterium H. pylori.  An upper gastrointestinal (GI) series of the esophagus, stomach, and small intestine.  An endoscopy to examine the esophagus, stomach, and small intestine.  A biopsy. TREATMENT  Treatment may include:  Eliminating the cause of the ulcer, such as smoking, NSAIDs, or alcohol.  Medicines to reduce the amount of acid in your digestive tract.  Antibiotic medicines if the ulcer is caused by the H. pylori bacterium.  An upper endoscopy to treat a bleeding ulcer.  Surgery if the bleeding is severe or if the ulcer created a hole somewhere in the digestive system. HOME CARE INSTRUCTIONS   Avoid tobacco, alcohol, and caffeine. Smoking can increase the acid in the stomach, and continued smoking will impair the healing of ulcers.  Avoid foods and drinks that seem to cause discomfort or aggravate your ulcer.  Only take medicines as directed by your caregiver. Do not substitute over-the-counter medicines for prescription medicines without talking to your caregiver.  Keep any follow-up appointments and tests as directed. SEEK MEDICAL CARE IF:   Your do not improve within 7 days of starting treatment.  You have ongoing indigestion or heartburn. SEEK IMMEDIATE MEDICAL CARE IF:   You have sudden, sharp, or persistent abdominal pain.  You have bloody or dark black, tarry stools.  You vomit blood or vomit that looks like coffee grounds.  You become light headed, weak, or feel faint.  You become sweaty or clammy. MAKE SURE YOU:    Understand these instructions.  Will watch your condition.  Will get help right away if you are not doing well or get worse. Document Released: 11/13/2000 Document Revised: 08/10/2012 Document Reviewed: 06/15/2012 [facility] Patient Information 2014 [place], [place].  Soft Diet The soft diet may be recommended after you were put on a full liquid diet. A normal diet may follow. The soft diet  can also be used after surgery if you are too ill to keep down a normal diet. The soft diet may also be needed if you have a hard time chewing foods. DESCRIPTION Tender foods are used. Foods do not need to be ground or pureed. Most raw fruits and vegetables and coarse breads and cereals should be avoided. Fried foods and highly seasoned foods may cause discomfort. NUTRITIONAL ADEQUACY A healthy diet is possible if foods from each of the basic food groups are eaten daily. SOFT DIET FOOD LISTS Milk/Dairy  Allowed: Milk and milk drinks, milk shakes, cream cheese, cottage cheese, mild cheeses.  Avoid: Sharp or highly seasoned cheese. Meat/Meat Substitutes  Allowed: Broiled, roasted, baked, or stewed tender lean beef, mutton, lamb, veal, chicken, [place], liver, ham, crisp bacon, white fish, tuna, salmon. Eggs, smooth peanut butter.  Avoid: All fried meats, fish, or fowl. Rich gravies and sauces. Lunch meats, sausages, hot dogs. Meats with gristle, chunky peanut butter. Breads/Grains  Allowed: Rice, noodles, spaghetti, macaroni. Dry or cooked refined cereals, such as farina, cream of wheat, oatmeal, grits, whole-wheat cereals. Plain or toasted white or wheat blend or whole-grain breads, soda crackers or saltines, flour tortillas.  Avoid: Wild rice, coarse cereals, such as bran. Seed in or on breads and crackers. Bread or bread products with nuts or seeds. Fruits/Vegetables  Allowed: Fruit and vegetable juices, well-cooked or canned fruits and vegetables, any dried fruit. One citrus fruit daily, 1  vitamin A source daily. Well-ripened, easy to chew fruits, sweet potatoes. Baked, boiled, mashed, creamed, scalloped, or au gratin potatoes. Broths or creamed soups made with allowed vegetables, strained tomatoes.  Avoid: All gas-forming vegetables (corn, radishes, Brussels sprouts, onions, broccoli, cabbage, parsnips, turnips, chili peppers, pinto beans, split peas, dried beans). Fruits containing seeds and skin. Potato chips and corn chips. All others that are not made with allowed vegetables. Highly seasoned soups. Desserts/Sweets  Allowed: Simple desserts, such as custard, junkets, gelatin desserts, plain ice cream and sherbets, simple cakes and cookies, allowed fruits, sugar, syrup, jelly, honey, plain hard candy, and molasses.  Avoid: Rich pastries, any dessert containing dates, nuts, raisins, or coconut. Fried pastries, such as doughnuts. Chocolate. Beverages  Allowed: Fruit and vegetable juices. Caffeine-free carbonated drinks, coffee, and tea.  Avoid: Caffeinated beverages: coffee, tea, soda or pop. Miscellaneous  Allowed: Butter, cream, margarine, mayonnaise, oil. Cream sauces, salt, and mild spices.  Avoid: Highly spiced salad dressings. Highly seasoned foods, hot sauce, mustard, horseradish, and pepper. SAMPLE MENU Breakfast  Orange juice.  Oatmeal.  Soft cooked egg.  Toast and margarine.  2% milk.  Coffee. Lunch  Meatloaf.  Mashed potato.  Green beans.  Lemon pudding.  Bread and margarine.  Coffee. Dinner  Consomm or apricot nectar.  Chicken breast.  Rice, peas, and carrots.  Applesauce.  Bread and margarine.  2% milk. To cut the amount of fat in your diet, omit margarine and use 1% or skim milk. NUTRIENT ANALYSIS  Calories........................1953 Kcal.  Protein.........................102 gm.  Carbohydrate...............247 gm.  Fat................................65 gm.  Cholesterol...................449 mg.  Dietary  fiber.................19 gm.  Vitamin A.....................2944 RE.  Vitamin C.....................79 mg.  Niacin..........................25 mg.  Riboflavin....................2.0 mg.  Thiamin.......................1.5 mg.  Folate..........................249 mcg.  Calcium.......................1030 mg.  Phosphorus.................1782 mg.  Zinc..............................12 mg.  Iron..............................13 mg.  Sodium.........................299 mg.  Potassium....................3046 mg. Document Released: 02/23/2008 Document Revised: 02/08/2012 Document Reviewed: 02/23/2008 [facility] Patient Information 2014 [place], [place].

## 2013-09-25 NOTE — Interval H&P Note (Signed)
History and Physical Interval Note:  09/25/2013 2:05 PM  Debbie [name]  has presented today for surgery, with the diagnosis of DYSPHAGIA  AND CONSTIPATION  The various methods of treatment have been discussed with the patient and family. After consideration of risks, benefits and other options for treatment, the patient has consented to  Procedure(s) with comments: COLONOSCOPY (N/A) - 2:15 ESOPHAGOGASTRODUODENOSCOPY (EGD) WITH ESOPHAGEAL DILATION (N/A) as a surgical intervention .  The patient's history has been reviewed, patient examined, no change in status, stable for surgery.  I have reviewed the patient's chart and labs.  Questions were answered to the patient's satisfaction.      Linzess is working very well for constipation. EGD and colonoscopy per plan.  The risks, benefits, limitations, imponderables and alternatives regarding both EGD and colonoscopy have been reviewed with the patient. Questions have been answered. All parties agreeable.    Eula Listen

## 2013-09-25 NOTE — Op Note (Signed)
[facility] [address] [place], [address]   ENDOSCOPY PROCEDURE REPORT  PATIENT: Debbie [name], Debbie [name]  MR#: [id number] BIRTHDATE: November 19, 1976 , 37  yrs. old GENDER: Female ENDOSCOPIST: R.  Roetta Sessions, MD FACP FACG REFERRED BY:  Lynnea Ferrier, M.D. PROCEDURE DATE:  09/25/2013 PROCEDURE:     EGD with gastric biopsy  INDICATIONS:     epigastric pain/esophageal dysphagia Abnormal duodenum on CT  INFORMED CONSENT:   The risks, benefits, limitations, alternatives and imponderables have been discussed.  The potential for biopsy, esophogeal dilation, etc. have also been reviewed.  Questions have been answered.  All parties agreeable.  Please see the history and physical in the medical record for more information.  MEDICATIONS:   Versed 6 mg IV and Demerol 125 mg IV in divided doses. Phenergan 25 mg IV and Zofran milligrams IV  DESCRIPTION OF PROCEDURE:   The EG-[id number] ([id number])  endoscope was introduced through the mouth and advanced to the second portion of the duodenum without difficulty or limitations.  The mucosal surfaces were surveyed very carefully during advancement of the scope and upon withdrawal.  Retroflexion view of the proximal stomach and esophagogastric junction was performed.      FINDINGS: Noncritical. Schatzki's ring; otherwise normal esophagus. Stomach into. Small hiatal hernia. 8 mm prepyloric antral ulcer with satellite erosions. No infiltrating process observed. Patent pylorus. The bulb, second and third portion appeared normal  THERAPEUTIC / DIAGNOSTIC MANEUVERS PERFORMED:  Biopsies of the antral ulcer taken for histologic study. During the procedure, patient became combative and I was not able to perform an esophageal dilation.   COMPLICATIONS:  None  IMPRESSION:  Schatzki's ring-not manipulated as outlined above. Hiatal hernia. Gastric ulcer and erosions status post biopsy. Normal duodenum through the third  portion.  RECOMMENDATIONS:  Followup on pathology. Avoid nonsteroidals. PPI. Will arrange a repeat EGD with dilation under deep sedation in the OR in the near future.    _______________________________ R. Roetta Sessions, MD FACP [facility] eSigned:  R. Roetta Sessions, MD FACP [facility] 09/25/2013 2:41 PM     CC:  PATIENT NAME:  Carlean, Crowl MR#: [id number]

## 2013-09-25 NOTE — H&P (View-Only) (Signed)
Referring Provider: Pickard, Warren T, MD Primary Care Physician:  PICKARD,WARREN TOM, MD Primary GI: Dr. Rourk   Chief Complaint  Patient presents with  . Constipation  . Vomiting  . Heartburn    HPI:   Debbie [name] is a 37-year-old female presenting today with history of constipation, GERD, esophageal dysphagia, and epigastric pain. Last seen in June 2013 and recommended to proceed with a TCS/EGD. However, she no-showed this appt. Presented to the ED yesterday due to constipation and abdominal pain. CT showed no acute findings, noted rounded soft-tissue density lesions along the nondependent portion of proximal duodenal wall. Question of small polyps. CBC, CMP normal.   Hasn't had a good BM in about a month. 7 days since actual passage of stool from rectum. History of taking Protonix, then adding Zantac. Stopped both due to no improvement with symptoms. Nexium worsened symptoms. Now not on any PPI. Has taken Prilosec in the past as well. Takes Dulcolax, which causes nausea and vomiting. No rectal bleeding. Mucus stool. Has tried Amitiza 24 mcg in the past without improvement. Has to strain a lot. Has tiny balls come out. Feels like she can't eat because the heartburn is so bad. Denies melena. Feels nausea secondary to severe constipation. Epigastric pain, chronic. Takes BC powders every 4 hours for migraines. Eats a couple bites of cheese toast a day, feels so full. Intermittent esophageal and solid food dysphagia. No odynophagia.   Past Medical History  Diagnosis Date  . GERD (gastroesophageal reflux disease)   . Asthma   . Migraines   . IBS (irritable bowel syndrome)   . Constipation   . Hot flashes 09/05/2013    Uses patch  . H/O estrogen therapy 09/05/2013  . Herpes simplex without mention of complication     Past Surgical History  Procedure Laterality Date  . Cesarean section    . Abdominal hysterectomy      right SOO    Current Outpatient Prescriptions  Medication Sig  Dispense Refill  . albuterol (PROVENTIL HFA;VENTOLIN HFA) 108 (90 BASE) MCG/ACT inhaler Inhale 2 puffs into the lungs every 6 (six) hours as needed. For shortness of breath  8.5 g  1  . Aspirin-Salicylamide-Caffeine (BC HEADACHE POWDER PO) Take 1 packet by mouth every 4 (four) hours as needed. For headache      . estradiol (VIVELLE-DOT) 0.1 MG/24HR Place 1 patch onto the skin 2 (two) times a week. Change on Thursdays and Sundays      . ondansetron (ZOFRAN-ODT) 4 MG disintegrating tablet Take 4-8 mg by mouth every 8 (eight) hours as needed for nausea.      . ranitidine (ZANTAC) 150 MG tablet Take 150 mg by mouth 2 (two) times daily as needed for heartburn.        No current facility-administered medications for this visit.    Allergies as of 09/14/2013 - Review Complete 09/14/2013  Allergen Reaction Noted  . Metronidazole Swelling 08/19/2011  . Advil [ibuprofen] Palpitations 08/19/2011    Family History  Problem Relation Age of Onset  . Stroke Mother   . Cancer Father     ?esophageal  . Stroke Father   . Colon cancer Neg Hx   . Lung cancer Maternal Grandfather     History   Social History  . Marital Status: Single    Spouse Name: N/A    Number of Children: 3  . Years of Education: N/A   Occupational History  . unemployed    Social History Main Topics  .   Smoking status: Current Every Day Smoker -- 1.50 packs/day for 21 years    Types: Cigarettes  . Smokeless tobacco: Never Used     Comment: cut back to 1/3 pack per day  . Alcohol Use: No  . Drug Use: Yes    Special: Marijuana     Comment: Marijuana twice a month  . Sexual Activity: Yes    Birth Control/ Protection: Surgical   Other Topics Concern  . None   Social History Narrative  . None    Review of Systems: Gen: +chills/sweats, seeing GYN  CV: Denies chest pain, palpitations, syncope, peripheral edema, and claudication. Resp: wheezing GI: see HPI Derm: Denies rash, itching, dry skin Psych: situational  depression due to not feeling good.  Heme: Denies bruising, bleeding, and enlarged lymph nodes.  Physical Exam: BP 117/77  Pulse 82  Temp(Src) 97.5 F (36.4 C) (Oral)  Ht 5' 6" (1.676 m)  Wt 186 lb (84.369 kg)  BMI 30.04 kg/m2 General:   Alert and oriented. No distress noted. Pleasant and cooperative.  Head:  Normocephalic and atraumatic. Eyes:  Conjuctiva clear without scleral icterus. Mouth:  Oral mucosa pink and moist. Good dentition. No lesions. Neck:  Supple, without mass or thyromegaly. Heart:  S1, S2 present without murmurs, rubs, or gallops. Regular rate and rhythm. Abdomen:  +BS, soft, TTP epigastric and lower abdomen and non-distended. No rebound or guarding. No HSM or masses noted. Rectal: no external mass appreciated. Internal exam without fecal impaction. Tight anal sphincter tone, no obvious mass   Msk:  Symmetrical without gross deformities. Normal posture. Extremities:  Without edema. Neurologic:  Alert and  oriented x4;  grossly normal neurologically. Skin:  Intact without significant lesions or rashes. Cervical Nodes:  No significant cervical adenopathy. Psych:  Alert and cooperative. Normal mood and affect.  CT 2012: Wall thickening of the ascending and transverse colon associated  with inflammatory change  CT 2014: 1. No acute findings to explain the patient's given symptoms, other  than the possibility of constipation.  2. Rounded soft tissue density lesions along the nondependent  portion of the proximal duodenal wall. Small polyps can have this  Appearance.  Lab Results  Component Value Date   WBC 8.8 09/13/2013   HGB 14.8 09/13/2013   HCT 42.7 09/13/2013   MCV 92.4 09/13/2013   PLT 178 09/13/2013   Lab Results  Component Value Date   ALT 9 09/13/2013   AST 14 09/13/2013   ALKPHOS 45 09/13/2013   BILITOT 0.3 09/13/2013   Lab Results  Component Value Date   CREATININE 0.75 09/13/2013   BUN 8 09/13/2013   NA 139 09/13/2013   K 3.3*  09/13/2013   CL 102 09/13/2013   CO2 26 09/13/2013      

## 2013-09-26 ENCOUNTER — Telehealth: Payer: Self-pay | Admitting: Internal Medicine

## 2013-09-26 ENCOUNTER — Other Ambulatory Visit: Payer: Self-pay | Admitting: Internal Medicine

## 2013-09-26 NOTE — Telephone Encounter (Signed)
Debbie [name] has a question about her medication, she states the office put her on Dexilant and during her procedure Dr. Jena Gauss gave her Protonix and she is not sure what she is supposed to be taking, please advise

## 2013-09-27 ENCOUNTER — Encounter (HOSPITAL_COMMUNITY): Payer: Self-pay | Admitting: Pharmacy Technician

## 2013-09-28 NOTE — Telephone Encounter (Signed)
Pt is take the Dexilant once a day.

## 2013-09-29 ENCOUNTER — Encounter (HOSPITAL_COMMUNITY): Payer: Self-pay | Admitting: Internal Medicine

## 2013-10-03 NOTE — Op Note (Signed)
NAME:  Debbie [name], Debbie [name]                    ACCOUNT NO.:  MEDICAL RECORD NO.:  [id number]  LOCATION:                                 FACILITY:  PHYSICIAN:  R. Roetta Sessions, MD FACP FACGDATE OF BIRTH:  1976-07-05  DATE OF PROCEDURE:  09/14/2013 DATE OF DISCHARGE:                              OPERATIVE REPORT   INDICATIONS FOR PROCEDURE:  37 year old lady with marked change in bowel habits.  Colonoscopy is now being performed.  Risks, benefits, limitations, alternatives, imponderables have been discussed, questions answered.  Please see the documentation medical record.  PROCEDURE:  O2 saturation, blood pressure, pulse, respirations were monitored throughout the entirety of the procedure.  CONSCIOUS SEDATION:  Versed 7 mg IV, Demerol 150 mg IV in divided doses, Zofran 4 mg IV.  INSTRUMENT:  Pentax video chip system.  FINDINGS:  Digital rectal exam revealed no abnormalities.  Endoscopic findings:  Prep was adequate.  Examination of the rectal mucosa including retroflexed view of the anal verge demonstrated no abnormalities.  Colon:  Colonic mucosa was surveyed from the rectosigmoid junction through the left, transverse, right colon to the appendiceal orifice, ileocecal valve/cecum.  These structures were well seen and photographed for the record.  From this level, scope was slowly and cautiously withdrawn all previously mentioned mucosal surfaces were again seen. The patient had a single 4-5 mm pedunculated polyp in the mid descending segment; the remainder of colonic mucosa appeared normal.  THERAPEUTIC/DIAGNOSTIC MANEUVERS PERFORMED:  The above-mentioned polyp was cold snare removed for the pathologist.  The patient tolerated the procedure well without complication.  IMPRESSION:  Single colonic polyp-removed as described above.  RECOMMENDATIONS:  Follow up on pathology.  See EGD report.  Further recommendations to follow.     Jonathon Bellows, MD FACP  [facility]     RMR/MEDQ  D:  10/03/2013  T:  10/03/2013  Job:  [id number]  cc:   Claude Manges, MD Fax: [phone]

## 2013-10-05 ENCOUNTER — Encounter: Payer: Self-pay | Admitting: Internal Medicine

## 2013-10-06 ENCOUNTER — Telehealth: Payer: Self-pay | Admitting: Internal Medicine

## 2013-10-06 ENCOUNTER — Encounter (HOSPITAL_COMMUNITY)
Admission: RE | Admit: 2013-10-06 | Discharge: 2013-10-06 | Disposition: A | Discharge status: Home / Self Care | Payer: Medicaid Other | Source: Ambulatory Visit | Attending: Family Medicine | Admitting: Family Medicine

## 2013-10-06 NOTE — Patient Instructions (Signed)
Debbie [name]  10/06/2013   Your procedure is scheduled on:  10/12/2013  Report to [facility] at  630  AM.  Call this number if you have problems the morning of surgery: [phone]   Remember:   Do not eat food or drink liquids after midnight.   Take these medicines the morning of surgery with A SIP OF WATER: dexilant   Do not wear jewelry, make-up or nail polish.  Do not wear lotions, powders, or perfumes.   Do not shave 48 hours prior to surgery. Men may shave face and neck.  Do not bring valuables to the hospital.  [facility] is not responsible  for any belongings or valuables.               Contacts, dentures or bridgework may not be worn into surgery.  Leave suitcase in the car. After surgery it may be brought to your room.  For patients admitted to the hospital, discharge time is determined by your treatment team.               Patients discharged the day of surgery will not be allowed to drive home.  Name and phone number of your driver: family  Special Instructions: Shower using CHG 2 nights before surgery and the night before surgery.  If you shower the day of surgery use CHG.  Use special wash - you have one bottle of CHG for all showers.  You should use approximately 1/3 of the bottle for each shower.   Please read over the following fact sheets that you were given: Pain Booklet, Coughing and Deep Breathing, Surgical Site Infection Prevention, Anesthesia Post-op Instructions and Care and Recovery After Surgery Esophagogastroduodenoscopy Esophagogastroduodenoscopy (EGD) is a procedure to examine the lining of the esophagus, stomach, and first part of the small intestine (duodenum). A long, flexible, lighted tube with a camera attached (endoscope) is inserted down the throat to view these organs. This procedure is done to detect problems or abnormalities, such as inflammation, bleeding, ulcers, or growths, in order to treat them. The procedure lasts about 5 20 minutes.  It is usually an outpatient procedure, but it may need to be performed in emergency cases in the hospital. LET YOUR CAREGIVER KNOW ABOUT:   Allergies to food or medicine.  All medicines you are taking, including vitamins, herbs, eyedrops, and over-the-counter medicines and creams.  Use of steroids (by mouth or creams).  Previous problems you or members of your family have had with the use of anesthetics.  Any blood disorders you have.  Previous surgeries you have had.  Other health problems you have.  Possibility of pregnancy, if this applies. RISKS AND COMPLICATIONS  Generally, EGD is a safe procedure. However, as with any procedure, complications can occur. Possible complications include:  Infection.  Bleeding.  Tearing (perforation) of the esophagus, stomach, or duodenum.  Difficulty breathing or not being able to breath.  Excessive sweating.  Spasms of the larynx.  Slowed heartbeat.  Low blood pressure. BEFORE THE PROCEDURE  Do not eat or drink anything for 6 8 hours before the procedure or as directed by your caregiver.  Ask your caregiver about changing or stopping your regular medicines.  If you wear dentures, be prepared to remove them before the procedure.  Arrange for someone to drive you home after the procedure. PROCEDURE   A vein will be accessed to give medicines and fluids. A medicine to relax you (sedative) and a pain  reliever will be given through that access into the vein.  A numbing medicine (local anesthetic) may be sprayed on your throat for comfort and to stop you from gagging or coughing.  A mouth guard may be placed in your mouth to protect your teeth and to keep you from biting on the endoscope.  You will be asked to lie on your left side.  The endoscope is inserted down your throat and into the esophagus, stomach, and duodenum.  Air is put through the endoscope to allow your caregiver to view the lining of your esophagus  clearly.  The esophagus, stomach, and duodenum is then examined. During the exam, your caregiver may:  Remove tissue to be examined under a microscope (biopsy) for inflammation, infection, or other medical problems.  Remove growths.  Remove objects (foreign bodies) that are stuck.  Treat any bleeding with medicines or other devices that stop tissues from bleeding (hot cauters, clipping devices).  Widen (dilate) or stretch narrowed areas of the esophagus and stomach.  The endoscope will then be withdrawn. AFTER THE PROCEDURE  You will be taken to a recovery area to be monitored. You will be able to go home once you are stable and alert.  Do not eat or drink anything until the local anesthetic and numbing medicines have worn off. You may choke.  It is normal to feel bloated, have pain with swallowing, or have a sore throat for a short time. This will wear off.  Your caregiver should be able to discuss his or her findings with you. It will take longer to discuss the test results if any biopsies were taken. Document Released: 03/19/2005 Document Revised: 11/02/2012 Document Reviewed: 10/19/2012 [facility] Patient Information 2014 [place], [place]. PATIENT INSTRUCTIONS POST-ANESTHESIA  IMMEDIATELY FOLLOWING SURGERY:  Do not drive or operate machinery for the first twenty four hours after surgery.  Do not make any important decisions for twenty four hours after surgery or while taking narcotic pain medications or sedatives.  If you develop intractable nausea and vomiting or a severe headache please notify your doctor immediately.  FOLLOW-UP:  Please make an appointment with your surgeon as instructed. You do not need to follow up with anesthesia unless specifically instructed to do so.  WOUND CARE INSTRUCTIONS (if applicable):  Keep a dry clean dressing on the anesthesia/puncture wound site if there is drainage.  Once the wound has quit draining you may leave it open to air.  Generally you  should leave the bandage intact for twenty four hours unless there is drainage.  If the epidural site drains for more than 36-48 hours please call the anesthesia department.  QUESTIONS?:  Please feel free to call your physician or the hospital operator if you have any questions, and they will be happy to assist you.

## 2013-10-06 NOTE — Telephone Encounter (Signed)
Patient was a N/S for pre op appointment scheduled on 11/07 at 11:00. I called and LMOM explaining that her procedure with RMR on the 13 with be canceled if she doesn't go to her pre op appt.

## 2013-10-09 ENCOUNTER — Other Ambulatory Visit: Payer: Self-pay | Admitting: Internal Medicine

## 2013-10-12 ENCOUNTER — Encounter (HOSPITAL_COMMUNITY): Admission: RE | Payer: Self-pay | Source: Ambulatory Visit

## 2013-10-12 ENCOUNTER — Ambulatory Visit (HOSPITAL_COMMUNITY): Admission: RE | Admit: 2013-10-12 | Payer: Medicaid Other | Source: Ambulatory Visit | Admitting: Internal Medicine

## 2013-10-12 ENCOUNTER — Telehealth: Payer: Self-pay | Admitting: Adult Health

## 2013-10-12 SURGERY — ESOPHAGOGASTRODUODENOSCOPY (EGD) WITH PROPOFOL
Anesthesia: Monitor Anesthesia Care

## 2013-10-12 MED ORDER — VALACYCLOVIR HCL 1 G PO TABS: 1000.0000 mg | ORAL_TABLET | Freq: Two times a day (BID) | ORAL | Status: DC

## 2013-10-12 NOTE — Telephone Encounter (Signed)
Has bump ?HSV requests valtrex , will rx

## 2013-10-24 ENCOUNTER — Telehealth: Payer: Self-pay | Admitting: Gastroenterology

## 2013-10-24 NOTE — Telephone Encounter (Signed)
Working on [place]. Pt is aware. 3 boxes of linzess at the front desk for pt to pick up

## 2013-10-24 NOTE — Telephone Encounter (Signed)
Patient is calling to R/S her EGD that she missed due to noshowing for her Pre Op Appt. I have got to get her back in for a new H&P and she is stating that her Medicaid does not pay for Linzess and shes out , what else can she take that medicaid will cover?

## 2013-11-01 ENCOUNTER — Other Ambulatory Visit: Payer: Self-pay | Admitting: Obstetrics and Gynecology

## 2013-11-01 NOTE — Telephone Encounter (Signed)
Will route Rx refill to JAGriffin, who saw pt in Oct 14

## 2013-11-01 NOTE — Telephone Encounter (Signed)
Will route Rx to JAGriffin, provider of record

## 2013-11-06 ENCOUNTER — Other Ambulatory Visit: Payer: Self-pay | Admitting: Adult Health

## 2013-11-06 MED ORDER — ESTRADIOL 0.1 MG/24HR TD PTTW: 1.0000 | MEDICATED_PATCH | TRANSDERMAL | Status: DC

## 2013-11-14 ENCOUNTER — Ambulatory Visit: Payer: Medicaid Other | Admitting: Gastroenterology

## 2013-12-05 ENCOUNTER — Encounter: Payer: Self-pay | Admitting: Gastroenterology

## 2013-12-05 ENCOUNTER — Encounter (INDEPENDENT_AMBULATORY_CARE_PROVIDER_SITE_OTHER): Payer: Self-pay

## 2013-12-05 ENCOUNTER — Ambulatory Visit (INDEPENDENT_AMBULATORY_CARE_PROVIDER_SITE_OTHER): Payer: Medicaid Other | Admitting: Gastroenterology

## 2013-12-05 ENCOUNTER — Other Ambulatory Visit: Payer: Self-pay | Admitting: Internal Medicine

## 2013-12-05 VITALS — BP 143/98 | HR 72 | Temp 97.6°F | Wt 182.8 lb

## 2013-12-05 DIAGNOSIS — K279 Peptic ulcer, site unspecified, unspecified as acute or chronic, without hemorrhage or perforation: Secondary | ICD-10-CM

## 2013-12-05 DIAGNOSIS — K5900 Constipation, unspecified: Secondary | ICD-10-CM

## 2013-12-05 NOTE — Patient Instructions (Signed)
We have scheduled you for an upper endoscopy with Dr. Gala Romney in the near future.   Further recommendations to follow.  Continue taking Dexilant once each morning. Linzess 290 mcg daily, 30 minutes before breakfast. You can do this every other day if needed.

## 2013-12-05 NOTE — Progress Notes (Signed)
Referring Provider: Susy Frizzle, MD Primary Care Physician:  Odette Fraction, MD Primary GI: Dr. Gala Romney   Chief Complaint  Patient presents with  . Follow-up    HPI:   Debbie [name] presents today in routine follow-up after EGD Oct 2014. Found to have Schatzki's ring non-manipulated, gastric ulcer and erosions. Normal duodenum through third portion. History of chronic constipation and GERD. Notes diarrhea intermittently after episodes of constipation. Linzess 290 mcg daily. Does not want to cut back on dosage.  If doesn't take it has constipation. No rectal bleeding. No dysphagia. Epigastric pain improved. Continues to take [facility] powders for headaches every 4 hours every day. Constant headache. Has never seen a neurologist. Needs surveillance EGD now. Continues to take Dexilant daily.   Past Medical History  Diagnosis Date  . GERD (gastroesophageal reflux disease)   . Asthma   . Migraines   . IBS (irritable bowel syndrome)   . Constipation   . Hot flashes 09/05/2013    Uses patch  . H/O estrogen therapy 09/05/2013  . Herpes simplex without mention of complication     Past Surgical History  Procedure Laterality Date  . Cesarean section    . Abdominal hysterectomy      right SOO  . Colonoscopy N/A 09/25/2013    [id number] colonic polyp-removed as described above  . Esophagogastroduodenoscopy N/A 09/25/2013    RMR: Schatzki's ring-not manipulated as outlined above/Hiatal hernia. Gastric ulcer and erosions status post biopsy/Normal duodenum through the third portion. Negative H.pylori    Current Outpatient Prescriptions  Medication Sig Dispense Refill  . albuterol (PROVENTIL HFA;VENTOLIN HFA) 108 (90 BASE) MCG/ACT inhaler Inhale 2 puffs into the lungs every 6 (six) hours as needed. For shortness of breath  8.5 g  1  . Aspirin-Salicylamide-Caffeine (BC HEADACHE POWDER PO) Take 1 packet by mouth every 4 (four) hours as needed. For headache      . dexlansoprazole  (DEXILANT) 60 MG capsule Take 1 capsule (60 mg total) by mouth daily.  30 capsule  3  . estradiol (VIVELLE-DOT) 0.1 MG/24HR patch Place 1 patch (0.1 mg total) onto the skin 2 (two) times a week.  8 patch  12  . Linaclotide (LINZESS) 290 MCG CAPS capsule Take 1 capsule (290 mcg total) by mouth daily. 30 minutes before breakfast.  30 capsule  11  . valACYclovir (VALTREX) 1000 MG tablet Take 1 tablet (1,000 mg total) by mouth 2 (two) times daily.  30 tablet  1   No current facility-administered medications for this visit.    Allergies as of 12/05/2013 - Review Complete 12/05/2013  Allergen Reaction Noted  . Metronidazole Swelling 08/19/2011  . Advil [ibuprofen] Palpitations 08/19/2011    Family History  Problem Relation Age of Onset  . Stroke Mother   . Cancer Father     ?esophageal  . Stroke Father   . Colon cancer Neg Hx   . Lung cancer Maternal Grandfather     History   Social History  . Marital Status: Single    Spouse Name: N/A    Number of Children: 3  . Years of Education: N/A   Occupational History  . unemployed    Social History Main Topics  . Smoking status: Current Every Day Smoker -- 1.50 packs/day for 21 years    Types: Cigarettes  . Smokeless tobacco: Never Used     Comment: cut back to 1/3 pack per day  . Alcohol Use: No  . Drug Use: Yes  Special: Marijuana     Comment: Marijuana twice a month  . Sexual Activity: Yes    Birth Control/ Protection: Surgical   Other Topics Concern  . None   Social History Narrative  . None    Review of Systems: As mentioned in HPI.   Physical Exam: BP 143/98  Pulse 72  Temp(Src) 97.6 F (36.4 C) (Oral)  Wt 182 lb 12.8 oz (82.918 kg) General:   Alert and oriented. No distress noted. Pleasant and cooperative.  Head:  Normocephalic and atraumatic. Eyes:  Conjuctiva clear without scleral icterus. Mouth:  Oral mucosa pink and moist. Good dentition. No lesions. Heart:  S1, S2 present without murmurs, rubs, or  gallops. Regular rate and rhythm. Abdomen:  +BS, soft, non-tender and non-distended. No rebound or guarding. No HSM or masses noted. Msk:  Symmetrical without gross deformities. Normal posture. Extremities:  Without edema. Neurologic:  Alert and  oriented x4;  grossly normal neurologically. Skin:  Intact without significant lesions or rashes. Psych:  Alert and cooperative. Normal mood and affect.

## 2013-12-05 NOTE — Assessment & Plan Note (Signed)
Continue Linzess 290 mcg daily.  

## 2013-12-05 NOTE — Assessment & Plan Note (Signed)
38 year old female with 21mm prepyloric antral ulcer on EGD in Oct 2014 in the setting of BC powders, now due for surveillance. She continues to use BC powders every 4 hours due to chronic headaches. I have counseled her on abstaining completely from these agents, and she is aware of the potential complications if she continues this. Due to persistent headaches, I have referred her to neurology; hopefully, she can find relief of headaches and avoidance of all aspirin powders in future. As of note, she denies any esophageal dysphagia. Non-critical Schatzki's ring on last EGD; however, she is asymptomatic.   Proceed with EGD with Dr. Gala Romney in near future with Propofol due to failed standard sedation in past. Risks and benefits discussed in detail with stated understanding.  Continue Dexilant daily Again stressed importance of avoiding BC powders, NSAIDs, aspirin powders

## 2013-12-06 NOTE — Progress Notes (Signed)
cc'd to pcp 

## 2013-12-13 ENCOUNTER — Encounter (HOSPITAL_COMMUNITY): Payer: Self-pay | Admitting: Pharmacy Technician

## 2013-12-13 NOTE — Patient Instructions (Signed)
Debbie [name]  12/13/2013   Your procedure is scheduled on:   12/21/2013  Report to [facility] at  43  AM.  Call this number if you have problems the morning of surgery: [phone]   Remember:   Do not eat food or drink liquids after midnight.   Take these medicines the morning of surgery with A SIP OF WATER:  Dexilant, linzess, valtrex   Do not wear jewelry, make-up or nail polish.  Do not wear lotions, powders, or perfumes.   Do not shave 48 hours prior to surgery. Men may shave face and neck.  Do not bring valuables to the hospital.  [facility] is not responsible for any belongings or valuables.               Contacts, dentures or bridgework may not be worn into surgery.  Leave suitcase in the car. After surgery it may be brought to your room.  For patients admitted to the hospital, discharge time is determined by your treatment team.               Patients discharged the day of surgery will not be allowed to drive home.  Name and phone number of your driver: family  Special Instructions: N/A   Please read over the following fact sheets that you were given: Pain Booklet, Coughing and Deep Breathing, Surgical Site Infection Prevention, Anesthesia Post-op Instructions and Care and Recovery After Surgery Esophagogastroduodenoscopy Esophagogastroduodenoscopy (EGD) is a procedure to examine the lining of the esophagus, stomach, and first part of the small intestine (duodenum). A long, flexible, lighted tube with a camera attached (endoscope) is inserted down the throat to view these organs. This procedure is done to detect problems or abnormalities, such as inflammation, bleeding, ulcers, or growths, in order to treat them. The procedure lasts about 5 20 minutes. It is usually an outpatient procedure, but it may need to be performed in emergency cases in the hospital. LET YOUR CAREGIVER KNOW ABOUT:   Allergies to food or medicine.  All medicines you are taking, including  vitamins, herbs, eyedrops, and over-the-counter medicines and creams.  Use of steroids (by mouth or creams).  Previous problems you or members of your family have had with the use of anesthetics.  Any blood disorders you have.  Previous surgeries you have had.  Other health problems you have.  Possibility of pregnancy, if this applies. RISKS AND COMPLICATIONS  Generally, EGD is a safe procedure. However, as with any procedure, complications can occur. Possible complications include:  Infection.  Bleeding.  Tearing (perforation) of the esophagus, stomach, or duodenum.  Difficulty breathing or not being able to breath.  Excessive sweating.  Spasms of the larynx.  Slowed heartbeat.  Low blood pressure. BEFORE THE PROCEDURE  Do not eat or drink anything for 6 8 hours before the procedure or as directed by your caregiver.  Ask your caregiver about changing or stopping your regular medicines.  If you wear dentures, be prepared to remove them before the procedure.  Arrange for someone to drive you home after the procedure. PROCEDURE   A vein will be accessed to give medicines and fluids. A medicine to relax you (sedative) and a pain reliever will be given through that access into the vein.  A numbing medicine (local anesthetic) may be sprayed on your throat for comfort and to stop you from gagging or coughing.  A mouth guard may be placed in your mouth  to protect your teeth and to keep you from biting on the endoscope.  You will be asked to lie on your left side.  The endoscope is inserted down your throat and into the esophagus, stomach, and duodenum.  Air is put through the endoscope to allow your caregiver to view the lining of your esophagus clearly.  The esophagus, stomach, and duodenum is then examined. During the exam, your caregiver may:  Remove tissue to be examined under a microscope (biopsy) for inflammation, infection, or other medical problems.  Remove  growths.  Remove objects (foreign bodies) that are stuck.  Treat any bleeding with medicines or other devices that stop tissues from bleeding (hot cauters, clipping devices).  Widen (dilate) or stretch narrowed areas of the esophagus and stomach.  The endoscope will then be withdrawn. AFTER THE PROCEDURE  You will be taken to a recovery area to be monitored. You will be able to go home once you are stable and alert.  Do not eat or drink anything until the local anesthetic and numbing medicines have worn off. You may choke.  It is normal to feel bloated, have pain with swallowing, or have a sore throat for a short time. This will wear off.  Your caregiver should be able to discuss his or her findings with you. It will take longer to discuss the test results if any biopsies were taken. Document Released: 03/19/2005 Document Revised: 11/02/2012 Document Reviewed: 10/19/2012 [facility] Patient Information 2014 [place], [place]. PATIENT INSTRUCTIONS POST-ANESTHESIA  IMMEDIATELY FOLLOWING SURGERY:  Do not drive or operate machinery for the first twenty four hours after surgery.  Do not make any important decisions for twenty four hours after surgery or while taking narcotic pain medications or sedatives.  If you develop intractable nausea and vomiting or a severe headache please notify your doctor immediately.  FOLLOW-UP:  Please make an appointment with your surgeon as instructed. You do not need to follow up with anesthesia unless specifically instructed to do so.  WOUND CARE INSTRUCTIONS (if applicable):  Keep a dry clean dressing on the anesthesia/puncture wound site if there is drainage.  Once the wound has quit draining you may leave it open to air.  Generally you should leave the bandage intact for twenty four hours unless there is drainage.  If the epidural site drains for more than 36-48 hours please call the anesthesia department.  QUESTIONS?:  Please feel free to call your physician or  the hospital operator if you have any questions, and they will be happy to assist you.

## 2013-12-14 ENCOUNTER — Encounter (HOSPITAL_COMMUNITY)
Admission: RE | Admit: 2013-12-14 | Discharge: 2013-12-14 | Disposition: A | Discharge status: Home / Self Care | Payer: Medicaid Other | Source: Ambulatory Visit | Attending: Internal Medicine | Admitting: Internal Medicine

## 2013-12-14 ENCOUNTER — Encounter (HOSPITAL_COMMUNITY): Payer: Self-pay

## 2013-12-14 DIAGNOSIS — Z01818 Encounter for other preprocedural examination: Secondary | ICD-10-CM | POA: Insufficient documentation

## 2013-12-14 DIAGNOSIS — Z01812 Encounter for preprocedural laboratory examination: Secondary | ICD-10-CM | POA: Insufficient documentation

## 2013-12-14 LAB — HEMOGLOBIN AND HEMATOCRIT, BLOOD
HCT: 38.4 % (ref 36.0–46.0)
Hemoglobin: 13.8 g/dL (ref 12.0–15.0)

## 2013-12-14 LAB — BASIC METABOLIC PANEL WITH GFR
BUN: 7 mg/dL (ref 6–23)
CO2: 21 meq/L (ref 19–32)
Calcium: 9.7 mg/dL (ref 8.4–10.5)
Chloride: 106 meq/L (ref 96–112)
Creatinine, Ser: 0.71 mg/dL (ref 0.50–1.10)
GFR calc Af Amer: >90 mL/min
GFR calc non Af Amer: >90 mL/min
Glucose, Bld: 83 mg/dL (ref 70–99)
Potassium: 4 meq/L (ref 3.7–5.3)
Sodium: 143 meq/L (ref 137–147)

## 2013-12-20 ENCOUNTER — Ambulatory Visit: Payer: Medicaid Other | Admitting: Physician Assistant

## 2013-12-21 ENCOUNTER — Encounter (HOSPITAL_COMMUNITY): Admission: RE | Payer: Self-pay | Source: Ambulatory Visit

## 2013-12-21 ENCOUNTER — Ambulatory Visit (HOSPITAL_COMMUNITY): Admission: RE | Admit: 2013-12-21 | Payer: Medicaid Other | Source: Ambulatory Visit | Admitting: Internal Medicine

## 2013-12-21 SURGERY — ESOPHAGOGASTRODUODENOSCOPY (EGD) WITH PROPOFOL
Anesthesia: Monitor Anesthesia Care

## 2013-12-28 ENCOUNTER — Ambulatory Visit (INDEPENDENT_AMBULATORY_CARE_PROVIDER_SITE_OTHER): Payer: Medicaid Other | Admitting: Adult Health

## 2013-12-28 ENCOUNTER — Encounter: Payer: Self-pay | Admitting: Adult Health

## 2013-12-28 VITALS — BP 120/82 | Ht 66.0 in | Wt 184.0 lb

## 2013-12-28 DIAGNOSIS — N3281 Overactive bladder: Secondary | ICD-10-CM

## 2013-12-28 DIAGNOSIS — N318 Other neuromuscular dysfunction of bladder: Secondary | ICD-10-CM

## 2013-12-28 DIAGNOSIS — R1032 Left lower quadrant pain: Secondary | ICD-10-CM | POA: Insufficient documentation

## 2013-12-28 HISTORY — DX: Left lower quadrant pain: R10.32

## 2013-12-28 HISTORY — DX: Overactive bladder: N32.81

## 2013-12-28 LAB — POCT URINALYSIS DIPSTICK
Blood, UA: NEGATIVE
Glucose, UA: NEGATIVE
Leukocytes, UA: NEGATIVE
Nitrite, UA: NEGATIVE
Protein, UA: NEGATIVE

## 2013-12-28 MED ORDER — MIRABEGRON ER 25 MG PO TB24: 25.0000 mg | ORAL_TABLET | Freq: Every day | ORAL | Status: DC

## 2013-12-28 NOTE — Progress Notes (Signed)
Subjective:     Patient ID: Laurena Spies, female   DOB: Jul 26, 1976, 38 y.o.   MRN: [id number]  HPI Darnisha is a 38 year old white female in complaining of up at night 3-4 times to pee and has to pee often during the day and it burns som and has noticed LLQ pain.  Review of Systems See HPI Reviewed past medical,surgical, social and family history. Reviewed medications and allergies.     Objective:   Physical Exam BP 120/82  Ht 5\' 6"  (1.676 m)  Wt 184 lb (83.462 kg)  BMI 29.71 kg/m2Urine dipstick negative, Skin warm and dry.Pelvic: external genitalia is normal in appearance, vagina: normal in appearance, cervix and uterus are absent. No adnexal masses has tenderness LLQ    Assessment:     OAB LLQ pain    Plan:    UA C&S Return in 1 week for [place] and see me Push water Review handout on pelvic pain  Try Myrbetriq, 1 dail given #28 lot [id number] exp 10/16

## 2013-12-28 NOTE — Patient Instructions (Signed)
Pelvic Pain, Female Female pelvic pain can be caused by many different things and start from a variety of places. Pelvic pain refers to pain that is located in the lower half of the abdomen and between your hips. The pain may occur over a short period of time (acute) or may be reoccurring (chronic). The cause of pelvic pain may be related to disorders affecting the female reproductive organs (gynecologic), but it may also be related to the bladder, kidney stones, an intestinal complication, or muscle or skeletal problems. Getting help right away for pelvic pain is important, especially if there has been severe, sharp, or a sudden onset of unusual pain. It is also important to get help right away because some types of pelvic pain can be life threatening.  CAUSES  Below are only some of the causes of pelvic pain. The causes of pelvic pain can be in one of several categories.   Gynecologic.  Pelvic inflammatory disease.  Sexually transmitted infection.  Ovarian cyst or a twisted ovarian ligament (ovarian torsion).  Uterine lining that grows outside the uterus (endometriosis).  Fibroids, cysts, or tumors.  Ovulation.  Pregnancy.  Pregnancy that occurs outside the uterus (ectopic pregnancy).  Miscarriage.  Labor.  Abruption of the placenta or ruptured uterus.  Infection.  Uterine infection (endometritis).  Bladder infection.  Diverticulitis.  Miscarriage related to a uterine infection (septic abortion).  Bladder.  Inflammation of the bladder (cystitis).  Kidney stone(s).  Gastrointenstinal.  Constipation.  Diverticulitis.  Neurologic.  Trauma.  Feeling pelvic pain because of mental or emotional causes (psychosomatic).  Cancers of the bowel or pelvis. EVALUATION  Your caregiver will want to take a careful history of your concerns. This includes recent changes in your health, a careful gynecologic history of your periods (menses), and a sexual history. Obtaining  your family history and medical history is also important. Your caregiver may suggest a pelvic exam. A pelvic exam will help identify the location and severity of the pain. It also helps in the evaluation of which organ system may be involved. In order to identify the cause of the pelvic pain and be properly treated, your caregiver may order tests. These tests may include:   A pregnancy test.  Pelvic ultrasonography.  An X-ray exam of the abdomen.  A urinalysis or evaluation of vaginal discharge.  Blood tests. HOME CARE INSTRUCTIONS   Only take over-the-counter or prescription medicines for pain, discomfort, or fever as directed by your caregiver.   Rest as directed by your caregiver.   Eat a balanced diet.   Drink enough fluids to make your urine clear or pale yellow, or as directed.   Avoid sexual intercourse if it causes pain.   Apply warm or cold compresses to the lower abdomen depending on which one helps the pain.   Avoid stressful situations.   Keep a journal of your pelvic pain. Write down when it started, where the pain is located, and if there are things that seem to be associated with the pain, such as food or your menstrual cycle.  Follow up with your caregiver as directed.  SEEK MEDICAL CARE IF:  Your medicine does not help your pain.  You have abnormal vaginal discharge. SEEK IMMEDIATE MEDICAL CARE IF:   You have heavy bleeding from the vagina.   Your pelvic pain increases.   You feel lightheaded or faint.   You have chills.   You have pain with urination or blood in your urine.   You have uncontrolled  diarrhea or vomiting.   You have a fever or persistent symptoms for more than 3 days.  You have a fever and your symptoms suddenly get worse.   You are being physically or sexually abused.  MAKE SURE YOU:  Understand these instructions.  Will watch your condition.  Will get help if you are not doing well or get worse. Document  Released: 10/13/2004 Document Revised: 05/17/2012 Document Reviewed: 03/07/2012 [facility] Patient Information 2014 [place], [place]. Push water Try myrbetriq Follow up in 1 week for [place]

## 2013-12-29 LAB — URINE CULTURE
Colony Count: NO GROWTH
Organism ID, Bacteria: NO GROWTH

## 2014-01-04 ENCOUNTER — Other Ambulatory Visit: Payer: Medicaid Other

## 2014-01-04 ENCOUNTER — Ambulatory Visit: Payer: Medicaid Other | Admitting: Adult Health

## 2014-02-08 ENCOUNTER — Other Ambulatory Visit: Payer: Self-pay | Admitting: Gastroenterology

## 2014-03-13 ENCOUNTER — Emergency Department (HOSPITAL_COMMUNITY)
Admission: EM | Admit: 2014-03-13 | Discharge: 2014-03-13 | Disposition: A | Discharge status: Home / Self Care | Payer: Medicaid Other

## 2014-06-11 ENCOUNTER — Emergency Department (HOSPITAL_COMMUNITY)
Admission: EM | Admit: 2014-06-11 | Discharge: 2014-06-11 | Disposition: A | Discharge status: Home / Self Care | Payer: Medicaid Other | Attending: Emergency Medicine | Admitting: Emergency Medicine

## 2014-06-11 ENCOUNTER — Encounter (HOSPITAL_COMMUNITY): Payer: Self-pay | Admitting: Emergency Medicine

## 2014-06-11 DIAGNOSIS — J45909 Unspecified asthma, uncomplicated: Secondary | ICD-10-CM | POA: Insufficient documentation

## 2014-06-11 DIAGNOSIS — K5900 Constipation, unspecified: Secondary | ICD-10-CM | POA: Insufficient documentation

## 2014-06-11 DIAGNOSIS — Z87448 Personal history of other diseases of urinary system: Secondary | ICD-10-CM | POA: Insufficient documentation

## 2014-06-11 DIAGNOSIS — K219 Gastro-esophageal reflux disease without esophagitis: Secondary | ICD-10-CM | POA: Diagnosis not present

## 2014-06-11 DIAGNOSIS — Z872 Personal history of diseases of the skin and subcutaneous tissue: Secondary | ICD-10-CM | POA: Diagnosis not present

## 2014-06-11 DIAGNOSIS — R519 Headache, unspecified: Secondary | ICD-10-CM

## 2014-06-11 DIAGNOSIS — R51 Headache: Secondary | ICD-10-CM | POA: Diagnosis not present

## 2014-06-11 DIAGNOSIS — K589 Irritable bowel syndrome without diarrhea: Secondary | ICD-10-CM | POA: Insufficient documentation

## 2014-06-11 DIAGNOSIS — F17200 Nicotine dependence, unspecified, uncomplicated: Secondary | ICD-10-CM | POA: Insufficient documentation

## 2014-06-11 DIAGNOSIS — I10 Essential (primary) hypertension: Secondary | ICD-10-CM | POA: Diagnosis not present

## 2014-06-11 DIAGNOSIS — Z8719 Personal history of other diseases of the digestive system: Secondary | ICD-10-CM | POA: Insufficient documentation

## 2014-06-11 DIAGNOSIS — Z8619 Personal history of other infectious and parasitic diseases: Secondary | ICD-10-CM | POA: Insufficient documentation

## 2014-06-11 LAB — COMPREHENSIVE METABOLIC PANEL
ALT: 14 U/L (ref 0–35)
AST: 17 U/L (ref 0–37)
Albumin: 4.1 g/dL (ref 3.5–5.2)
Alkaline Phosphatase: 55 U/L (ref 39–117)
Anion gap: 12 (ref 5–15)
BUN: 8 mg/dL (ref 6–23)
CO2: 25 mEq/L (ref 19–32)
Calcium: 10 mg/dL (ref 8.4–10.5)
Chloride: 107 mEq/L (ref 96–112)
Creatinine, Ser: 0.77 mg/dL (ref 0.50–1.10)
GFR calc Af Amer: >90 mL/min (ref >90)
GFR calc non Af Amer: >90 mL/min (ref >90)
Glucose, Bld: 96 mg/dL (ref 70–99)
Potassium: 3.6 mEq/L — ABNORMAL LOW (ref 3.7–5.3)
Sodium: 144 mEq/L (ref 137–147)
Total Bilirubin: 0.2 mg/dL — ABNORMAL LOW (ref 0.3–1.2)
Total Protein: 7.4 g/dL (ref 6.0–8.3)

## 2014-06-11 LAB — SALICYLATE LEVEL: Salicylate Lvl: 10.6 mg/dL (ref 2.8–20.0)

## 2014-06-11 MED ORDER — DIPHENHYDRAMINE HCL 50 MG/ML IJ SOLN
25.0000 mg | Freq: Once | INTRAMUSCULAR | Status: AC
  Administered 2014-06-11: 25 mg via INTRAVENOUS
  Filled 2014-06-11: qty 1

## 2014-06-11 MED ORDER — METOCLOPRAMIDE HCL 5 MG/ML IJ SOLN
10.0000 mg | Freq: Once | INTRAMUSCULAR | Status: AC
  Administered 2014-06-11: 10 mg via INTRAVENOUS
  Filled 2014-06-11: qty 2

## 2014-06-11 MED ORDER — NAPROXEN 500 MG PO TABS: 500.0000 mg | ORAL_TABLET | Freq: Two times a day (BID) | ORAL | Status: DC

## 2014-06-11 MED ORDER — SODIUM CHLORIDE 0.9 % IV BOLUS (SEPSIS)
1000.0000 mL | Freq: Once | INTRAVENOUS | Status: AC
  Administered 2014-06-11: 1000 mL via INTRAVENOUS

## 2014-06-11 MED ORDER — KETOROLAC TROMETHAMINE 30 MG/ML IJ SOLN
30.0000 mg | Freq: Once | INTRAMUSCULAR | Status: AC
  Administered 2014-06-11: 30 mg via INTRAVENOUS
  Filled 2014-06-11: qty 1

## 2014-06-11 NOTE — Discharge Instructions (Signed)
No more aspirin containing medicines BC powder has lots of aspirin in it  Naprosyn twice daily for pain as needed

## 2014-06-11 NOTE — ED Provider Notes (Signed)
CSN: [id number]     Arrival date & time 06/11/14  0259 History   First MD Initiated Contact with Patient 06/11/14 0321     Chief Complaint  Patient presents with  . Migraine    onset was 4 hrs ago.   . Nausea     (Consider location/radiation/quality/duration/timing/severity/associated sxs/prior Treatment) HPI Comments: The patient is a 38 year old female with a history of migraine headaches and peptic ulcer disease. She states that she developed a headache approximately 4 hours ago which is bitemporal, bifrontal throbbing sensation, nothing makes this better or worse, no associated fevers, chills, diarrhea, shortness of breath, chest pain, stiff neck, sore throat, numbness, weakness or changes in vision. She denies photophobia or phonophobia. She has had no difficulty with walking or balance. She endorses having some nausea this evening after taking BC powder.  The patient states that she gets headaches similar to this fairly regularly, they often get bad enough that she has to come to the hospital for a "shot of medicine". She states that she uses the [facility] powder every 4 hours, she does this day after day after day. This is a chronic medication which she seems to overuse. She is unaware that this medication contains aspirin and is potentially harmful to her health. She does report having episodes of hearing difficulties over the last 6 months which is intermittent.  Patient is a 38 y.o. female presenting with migraines. The history is provided by the patient.  Migraine    Past Medical History  Diagnosis Date  . GERD (gastroesophageal reflux disease)   . Asthma   . Migraines   . IBS (irritable bowel syndrome)   . Constipation   . Hot flashes 09/05/2013    Uses patch  . H/O estrogen therapy 09/05/2013  . Herpes simplex without mention of complication   . LLQ pain 12/28/2013  . OAB (overactive bladder) 12/28/2013   Past Surgical History  Procedure Laterality Date  . Cesarean section     . Abdominal hysterectomy      right SOO  . Colonoscopy N/A 09/25/2013    GGY:IRSWNI colonic polyp-removed as described above  . Esophagogastroduodenoscopy N/A 09/25/2013    RMR: Schatzki's ring-not manipulated as outlined above/Hiatal hernia. Gastric ulcer and erosions status post biopsy/Normal duodenum through the third portion. Negative H.pylori   Family History  Problem Relation Age of Onset  . Stroke Mother   . Cancer Father     ?esophageal  . Stroke Father   . Colon cancer Neg Hx   . Lung cancer Maternal Grandfather    History  Substance Use Topics  . Smoking status: Current Every Day Smoker -- 1.50 packs/day for 21 years    Types: Cigarettes  . Smokeless tobacco: Never Used     Comment: cut back to 1/3 pack per day  . Alcohol Use: No   OB History   Grav Para Term Preterm Abortions TAB SAB Ect Mult Living   4 4        4      Review of Systems  All other systems reviewed and are negative.     Allergies  Metronidazole and Advil  Home Medications   Prior to Admission medications   Medication Sig Start Date End Date Taking? Authorizing Provider  Linaclotide (LINZESS) 290 MCG CAPS capsule Take 1 capsule (290 mcg total) by mouth daily. 30 minutes before breakfast. 09/14/13  Yes Orvil Feil, NP  albuterol (PROVENTIL HFA;VENTOLIN HFA) 108 (90 BASE) MCG/ACT inhaler Inhale 2 puffs into  the lungs every 6 (six) hours as needed. For shortness of breath 06/05/13   Susy Frizzle, MD  DEXILANT 60 MG capsule take 1 capsule by mouth once daily 02/08/14   Orvil Feil, NP  mirabegron ER (MYRBETRIQ) 25 MG TB24 tablet Take 1 tablet (25 mg total) by mouth daily. 12/28/13   Estill Dooms, NP  naproxen (NAPROSYN) 500 MG tablet Take 1 tablet (500 mg total) by mouth 2 (two) times daily with a meal. 06/11/14   Johnna Acosta, MD  valACYclovir (VALTREX) 1000 MG tablet Take 1,000 mg by mouth daily.    Historical Provider, MD   BP 139/96  Pulse 69  Temp(Src) 97.9 F (36.6 C) (Oral)   Resp 20  Ht 5\' 6"  (1.676 m)  Wt 186 lb (84.369 kg)  BMI 30.04 kg/m2  SpO2 100% Physical Exam  Nursing note and vitals reviewed. Constitutional: She appears well-developed and well-nourished. No distress.  HENT:  Head: Normocephalic and atraumatic.  Mouth/Throat: Oropharynx is clear and moist. No oropharyngeal exudate.  Normocephalic, atraumatic, clear oropharynx without any swelling, exudate, asymmetry, hypertrophy or erythema.  Moist mucous membranes, clear nasal passages, normal tympanic membranes without any effusions, erythema or opacification.  Eyes: Conjunctivae and EOM are normal. Pupils are equal, round, and reactive to light. Right eye exhibits no discharge. Left eye exhibits no discharge. No scleral icterus.  Neck: Normal range of motion. Neck supple. No JVD present. No thyromegaly present.  Cardiovascular: Normal rate, regular rhythm, normal heart sounds and intact distal pulses.  Exam reveals no gallop and no friction rub.   No murmur heard. Pulmonary/Chest: Effort normal and breath sounds normal. No respiratory distress. She has no wheezes. She has no rales.  Abdominal: Soft. Bowel sounds are normal. She exhibits no distension and no mass. There is no tenderness.  Musculoskeletal: Normal range of motion. She exhibits no edema and no tenderness.  Lymphadenopathy:    She has no cervical adenopathy.  Neurological: She is alert. Coordination normal.  Speech is clear, cranial nerves III through XII are intact, memory is intact, strength is normal in all 4 extremities including grips, sensation is intact to light touch and pinprick in all 4 extremities. Coordination as tested by finger-nose-finger is normal, no limb ataxia. Normal gait, normal reflexes at the patellar tendons bilaterally  Skin: Skin is warm and dry. No rash noted. No erythema.  Psychiatric: She has a normal mood and affect. Her behavior is normal.    ED Course  Procedures (including critical care time) Labs  Review Labs Reviewed  COMPREHENSIVE METABOLIC PANEL - Abnormal; Notable for the following:    Potassium 3.6 (*)    Total Bilirubin 0.2 (*)    All other components within normal limits  SALICYLATE LEVEL    Imaging Review No results found.    MDM   Final diagnoses:  Acute nonintractable headache, unspecified headache type    At this time the patient does not appear to have any focal neurologic deficits, her vital signs show mild hypertension but no other acute findings. I suspect that she is overdosing on aspirin-containing medications and I have concern for some endorgan dysfunction. I have counseled her extensively on the use of aspirin-containing medications and it made the recommendation that she avoid these entirely. I will check liver and renal function, salicylate level and treated with medications for migraine-type headache. She states an allergy to ibuprofen but states "Advil" it is safe. She will receive Toradol, Reglan, Benadryl, fluids and reevaluation.  Reevaluated  at 5:30 AM, no pain, no symptoms, given reassurance that her kidney function was normal, her salicylate level was in a normal range at this time but I requested that the patient stopped using medications containing aspirin, I insisted that she followup with the family doctor for further care of her headaches, she was agreeable to the plan.  Meds given in ED:  Medications  ketorolac (TORADOL) 30 MG/ML injection 30 mg (30 mg Intravenous Given 06/11/14 0359)  metoCLOPramide (REGLAN) injection 10 mg (10 mg Intravenous Given 06/11/14 0359)  diphenhydrAMINE (BENADRYL) injection 25 mg (25 mg Intravenous Given 06/11/14 0359)  sodium chloride 0.9 % bolus 1,000 mL (0 mLs Intravenous Stopped 06/11/14 0517)    New Prescriptions   NAPROXEN (NAPROSYN) 500 MG TABLET    Take 1 tablet (500 mg total) by mouth 2 (two) times daily with a meal.        Johnna Acosta, MD 06/11/14 [phone]

## 2014-07-20 ENCOUNTER — Other Ambulatory Visit: Payer: Self-pay | Admitting: Gastroenterology

## 2014-08-06 IMAGING — CT CT ABD-PELV W/ CM
2 of 3 series · 17 of 46 positions shown, 19 images · IV contrast (Omnipaque 300)
Comparison: 09/02/2012.

CLINICAL DATA: Upper mid abdominal pain.

EXAM:
CT ABDOMEN AND PELVIS WITH CONTRAST
TECHNIQUE: Multidetector CT imaging of the abdomen and pelvis was performed
using the standard protocol following bolus administration of
intravenous contrast.
CONTRAST:  100mL OMNIPAQUE IOHEXOL 300 MG/ML  SOLN

[Series 2: abd_pel_with 5.0 b40f · axial · 0.72mm/px · z∈[+624,+1029]mm · 14 of 94 slices shown, 16 images]
[im 7/94  soft-tissue]
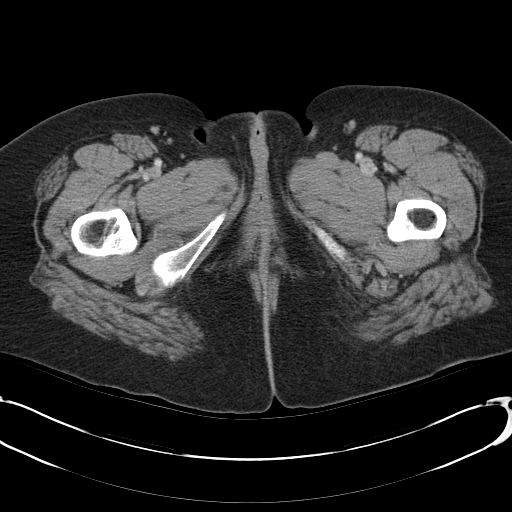
[im 7/94  bone]
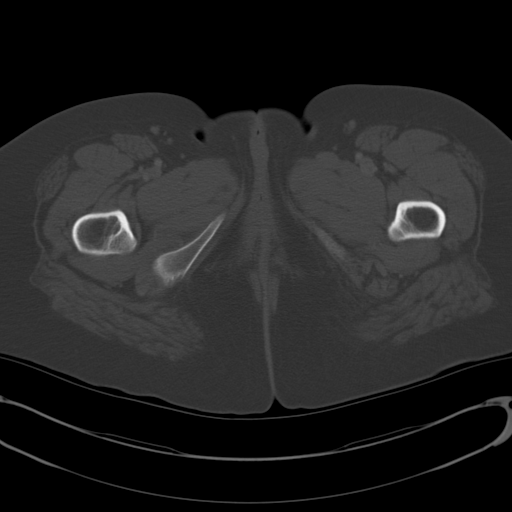
[im 13/94  soft-tissue]
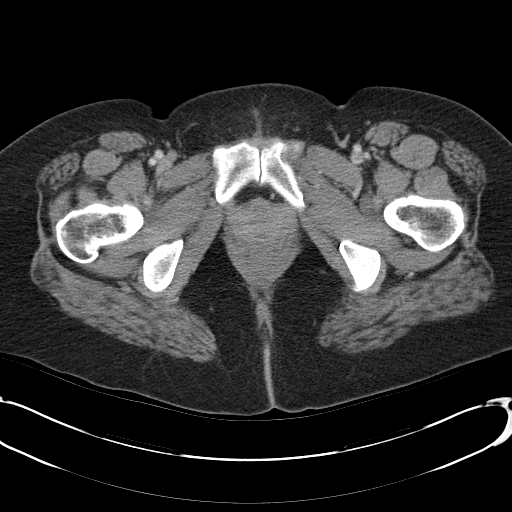
[im 19/94  soft-tissue]
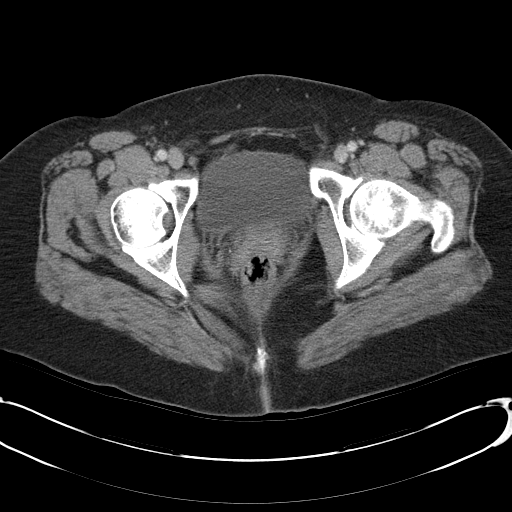
[im 25/94  soft-tissue]
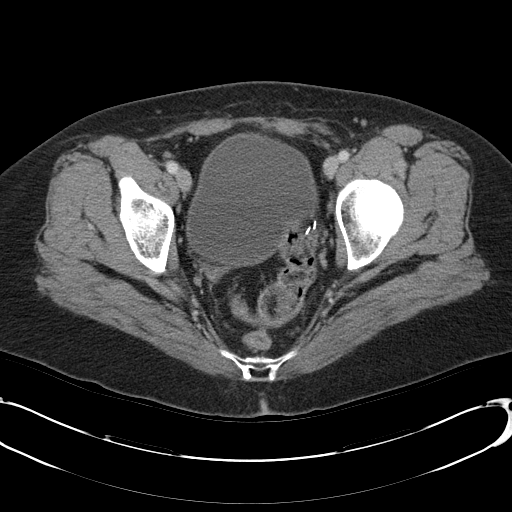
[im 31/94  soft-tissue]
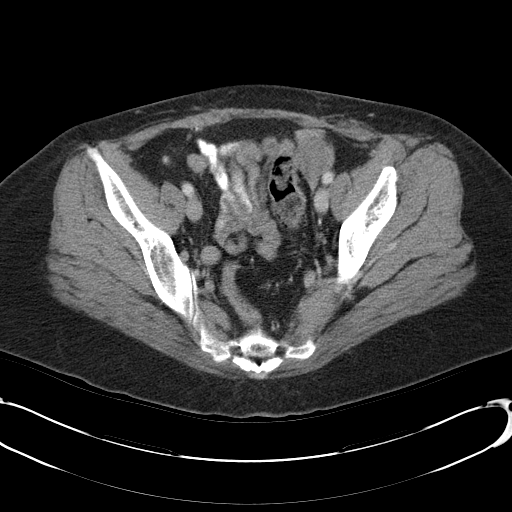
[im 37/94  soft-tissue]
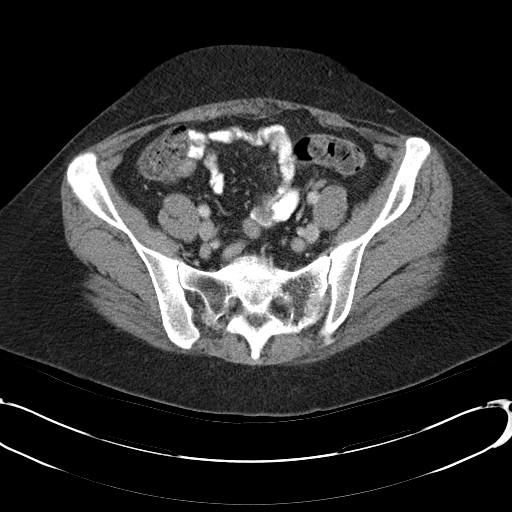
[im 43/94  soft-tissue]
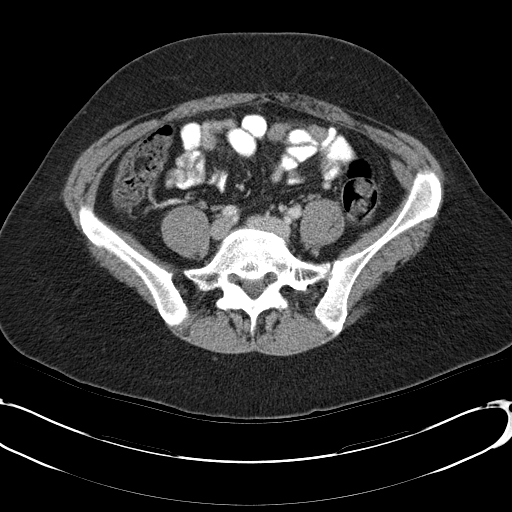
[im 52/94  soft-tissue]
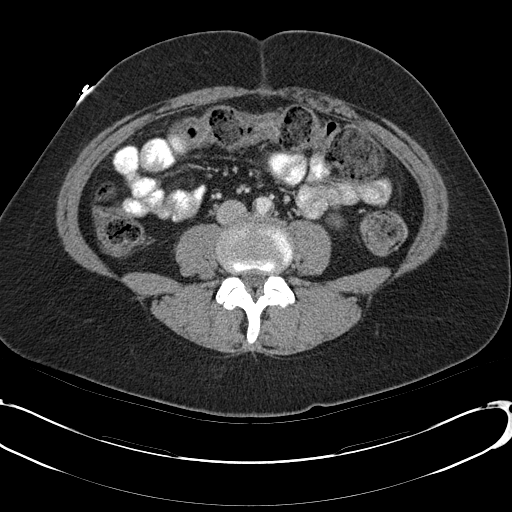
[im 58/94  soft-tissue]
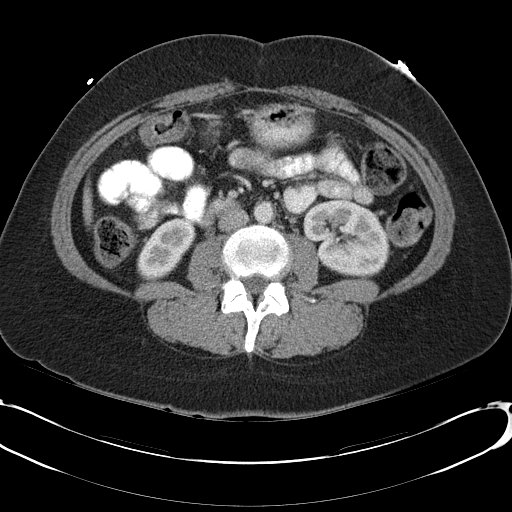
[im 58/94  bone]
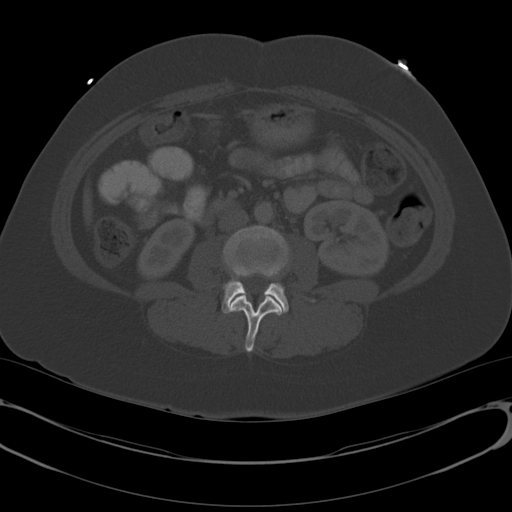
[im 64/94  soft-tissue]
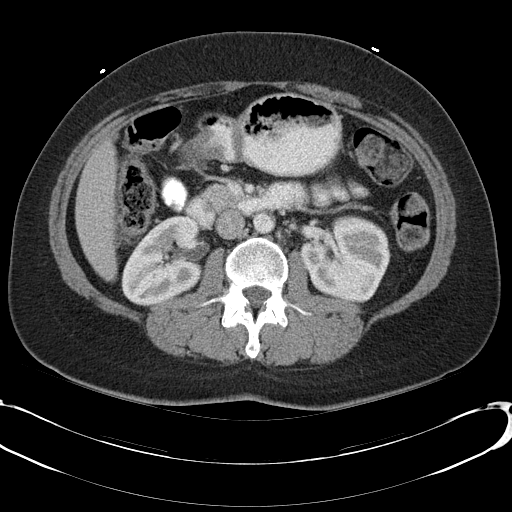
[im 70/94  soft-tissue]
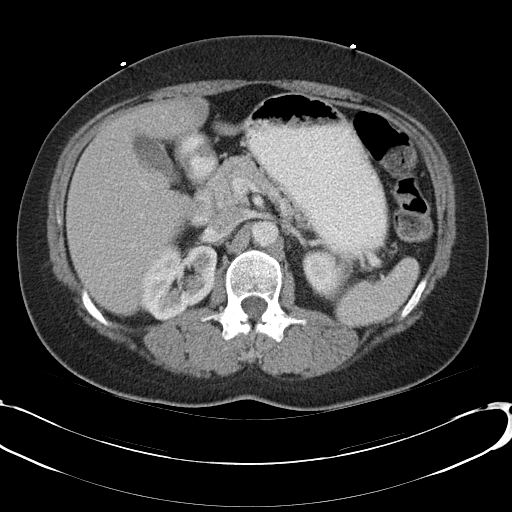
[im 76/94  soft-tissue]
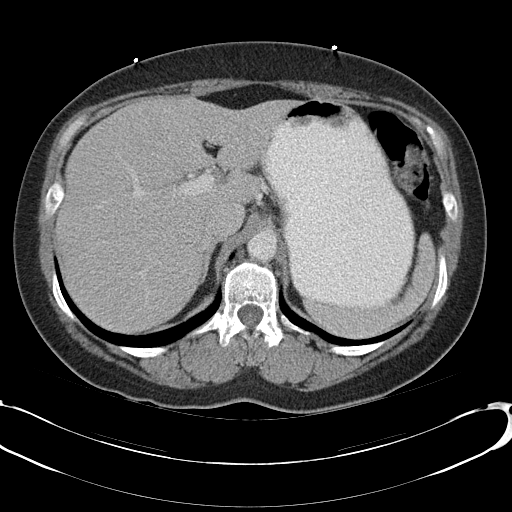
[im 82/94  soft-tissue]
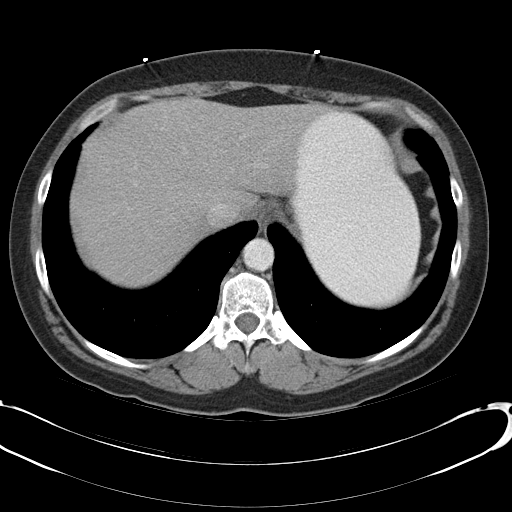
[im 88/94  soft-tissue]
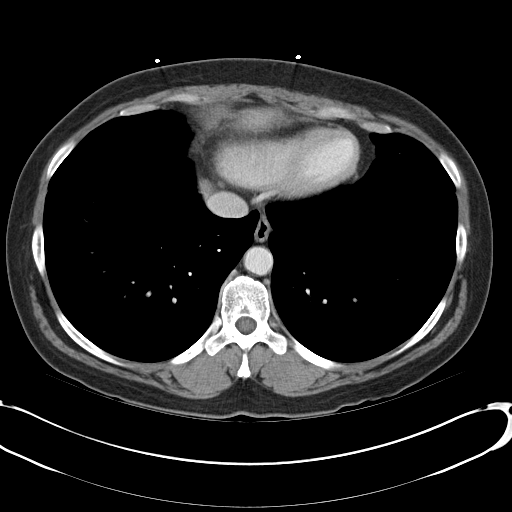

[Series 4: abd_pel_with 3.0 spo cor · coronal · 0.74mm/px · 3 of 75 slices shown]
[im 25/75  soft-tissue]
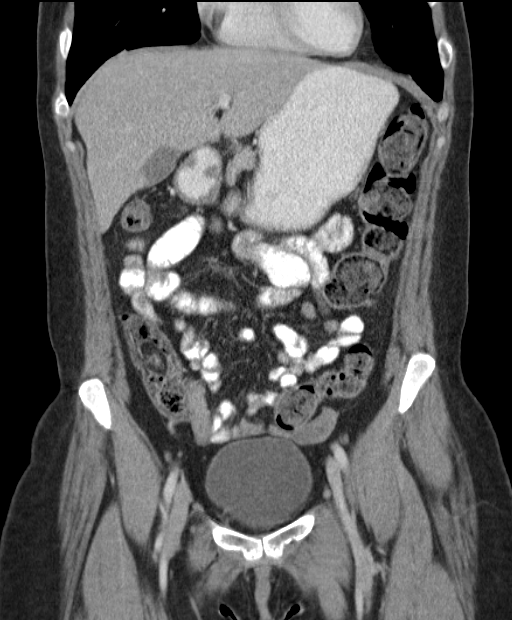
[im 33/75  soft-tissue]
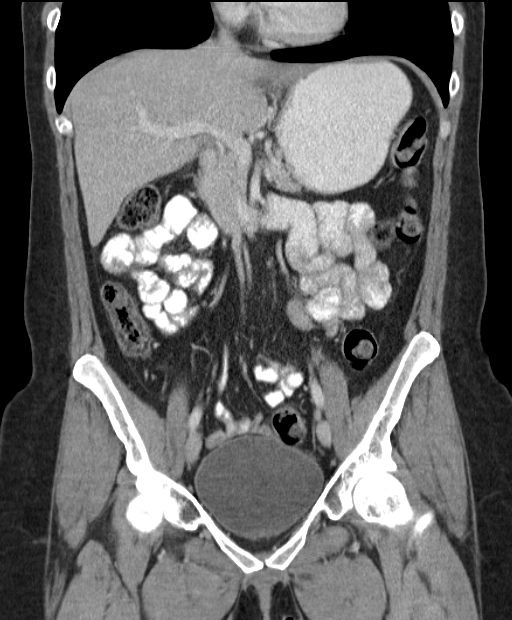
[im 42/75  soft-tissue]
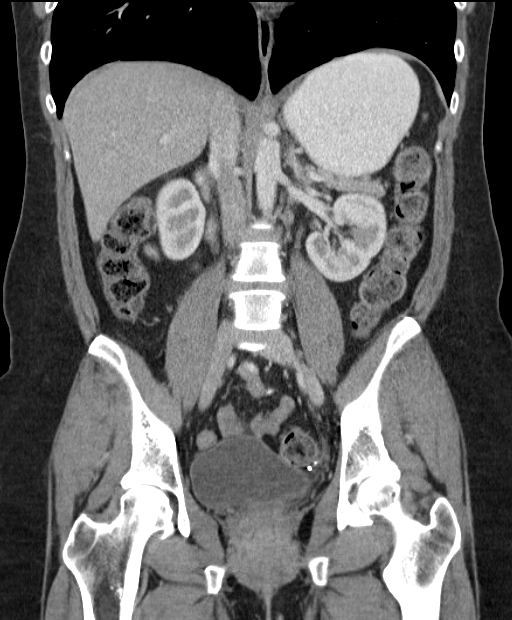

[17 of 46 positions shown; findings below may reference images not displayed]

FINDINGS: Lung bases show no acute findings. Heart size normal. No pericardial
or pleural effusion.

Liver, gallbladder, adrenal glands, kidneys, spleen, pancreas,
stomach discussion and stomach are unremarkable. There are two
rounded soft tissue density lesions along the [DATE] to [DATE] portions
of the proximal duodenal wall (series 2, images 28 and 31). Small
bowel is otherwise unremarkable. A fair amount of stool is seen in
the colon. Hysterectomy.

No pathologically enlarged lymph nodes. No free fluid. No worrisome
lytic or sclerotic lesions.
IMPRESSION: 1. No acute findings to explain the patient's given symptoms, other
than the possibility of constipation.
2. Rounded soft tissue density lesions along the nondependent
portion of the proximal duodenal wall. Small polyps can have this
appearance.

## 2014-08-29 ENCOUNTER — Other Ambulatory Visit: Payer: Self-pay

## 2014-08-29 ENCOUNTER — Encounter: Payer: Self-pay | Admitting: Gastroenterology

## 2014-08-29 ENCOUNTER — Ambulatory Visit (INDEPENDENT_AMBULATORY_CARE_PROVIDER_SITE_OTHER): Payer: Medicaid Other | Admitting: Gastroenterology

## 2014-08-29 VITALS — BP 136/86 | HR 81 | Temp 97.3°F | Ht 66.0 in | Wt 178.0 lb

## 2014-08-29 DIAGNOSIS — R110 Nausea: Secondary | ICD-10-CM

## 2014-08-29 DIAGNOSIS — R5383 Other fatigue: Secondary | ICD-10-CM

## 2014-08-29 DIAGNOSIS — K279 Peptic ulcer, site unspecified, unspecified as acute or chronic, without hemorrhage or perforation: Secondary | ICD-10-CM

## 2014-08-29 DIAGNOSIS — R519 Headache, unspecified: Secondary | ICD-10-CM

## 2014-08-29 DIAGNOSIS — K219 Gastro-esophageal reflux disease without esophagitis: Secondary | ICD-10-CM

## 2014-08-29 DIAGNOSIS — K5900 Constipation, unspecified: Secondary | ICD-10-CM

## 2014-08-29 DIAGNOSIS — R51 Headache: Secondary | ICD-10-CM

## 2014-08-29 DIAGNOSIS — R5381 Other malaise: Secondary | ICD-10-CM

## 2014-08-29 MED ORDER — LINACLOTIDE 290 MCG PO CAPS: 290.0000 ug | ORAL_CAPSULE | Freq: Every day | ORAL | Status: DC

## 2014-08-29 NOTE — Patient Instructions (Signed)
1. Upper endoscopy to followup on stomach ulcer. Please see separate instructions. 2. Please have your blood work done. 3. Continue Dexilant and Linzess. 4. We are going to send you to a neurologist for your headaches. You need to try and eliminate aspirin powders due to your history of stomach ulcers.

## 2014-08-29 NOTE — Progress Notes (Signed)
cc'ed to pcp °

## 2014-08-29 NOTE — Assessment & Plan Note (Signed)
No prior neurological workup. Chronic headache, daily. Using [facility] powders for management. Not appropriate management given history of peptic ulcer disease. Recommend neurological evaluation.

## 2014-08-29 NOTE — Progress Notes (Signed)
Primary Care Physician:  Redge Gainer, MD  Primary Gastroenterologist:  Garfield Cornea, MD   Chief Complaint  Patient presents with  . Heartburn    HPI:  Debbie [name] is a 38 y.o. female here for followup. Patient reports that Criss Alvine, NP requested she come back to see [place]. Rulon Abide last saw her in January 2015 and plan for surveillance EGD given history of gastric ulcer. Patient states she freaked out about being put to sleep interested in having it done.  History of EGD in October 2014. Found to have Schatzki ring which was not manipulated, gastric ulcer and erosions. History of BC powder use.   [place] working very well. No problem with heartburn on Dexilant. Complains of fatigue. Complains of early morning nausea. Had been using zofran but makes constipated. Takes BC powders every 4 hours for headache. Never been to headache specialist. Never been to [facility], Dr. Redge Gainer, but it is on her Medicaid card. No melena, brbpr. 3-4 times daily on Linzess. [place] 7. Feels better this way. When took QOD, didn't work. Denies abdominal pain. Denies dysphagia. No previous workup of headaches.  Current Outpatient Prescriptions  Medication Sig Dispense Refill  . albuterol (PROVENTIL HFA;VENTOLIN HFA) 108 (90 BASE) MCG/ACT inhaler Inhale 2 puffs into the lungs every 6 (six) hours as needed. For shortness of breath  8.5 g  1  . Aspirin-Salicylamide-Caffeine (BC HEADACHE POWDER PO) Take by mouth.      . DEXILANT 60 MG capsule take 1 capsule once daily  30 capsule  3  . Linaclotide (LINZESS) 290 MCG CAPS capsule Take 1 capsule (290 mcg total) by mouth daily. 30 minutes before breakfast.  30 capsule  11   No current facility-administered medications for this visit.    Allergies as of 08/29/2014 - Review Complete 08/29/2014  Allergen Reaction Noted  . Metronidazole Swelling 08/19/2011  . Advil [ibuprofen] Palpitations 08/19/2011    Past Medical History  Diagnosis Date  .  GERD (gastroesophageal reflux disease)   . Asthma   . Migraines   . IBS (irritable bowel syndrome)   . Constipation   . Hot flashes 09/05/2013    Uses patch  . H/O estrogen therapy 09/05/2013  . Herpes simplex without mention of complication   . LLQ pain 12/28/2013  . OAB (overactive bladder) 12/28/2013    Past Surgical History  Procedure Laterality Date  . Cesarean section    . Abdominal hysterectomy      right SOO  . Colonoscopy N/A 09/25/2013    QIW:[id number] colonic polyp-removed as described above. Tubular adenoma. next TCS 08/2020  . Esophagogastroduodenoscopy N/A 09/25/2013    RMR: Schatzki's ring-not manipulated as outlined above/Hiatal hernia. Gastric ulcer and erosions status post biopsy/Normal duodenum through the third portion. Negative H.pylori    Family History  Problem Relation Age of Onset  . Stroke Mother   . Cancer Father     ?esophageal  . Stroke Father   . Colon cancer Neg Hx   . Lung cancer Maternal Grandfather     History   Social History  . Marital Status: Single    Spouse Name: N/A    Number of Children: 3  . Years of Education: N/A   Occupational History  . unemployed    Social History Main Topics  . Smoking status: Current Every Day Smoker -- 1.50 packs/day for 21 years    Types: Cigarettes  . Smokeless tobacco: Never Used     Comment: cut back to 1/3 pack  per day  . Alcohol Use: No  . Drug Use: Yes    Special: Marijuana     Comment: Marijuana twice a month  . Sexual Activity: Yes    Birth Control/ Protection: Surgical   Other Topics Concern  . Not on file   Social History Narrative  . No narrative on file      ROS:  General: Negative for anorexia, fever, chills. Complains of a 6 pound weight loss, fatigue, weakness. Eyes: Negative for vision changes.  ENT: Negative for hoarseness, difficulty swallowing , nasal congestion. CV: Negative for chest pain, angina, palpitations, dyspnea on exertion, peripheral edema.  Respiratory:  Negative for dyspnea at rest, dyspnea on exertion, cough, sputum, wheezing.  GI: See history of present illness. GU:  Negative for dysuria, hematuria, urinary incontinence, urinary frequency, nocturnal urination.  MS: Negative for joint pain, low back pain.  Derm: Negative for rash or itching.  Neuro: Negative for weakness, abnormal sensation, seizure, frequent headaches, memory loss, confusion.  Psych: Negative for anxiety, depression, suicidal ideation, hallucinations.  Endo: See history of present illness Heme: Negative for bruising or bleeding. Allergy: Negative for rash or hives.    Physical Examination:  BP 136/86  Pulse 81  Temp(Src) 97.3 F (36.3 C)  Ht 5\' 6"  (1.676 m)  Wt 178 lb (80.74 kg)  BMI 28.74 kg/m2   General: Well-nourished, well-developed in no acute distress.  Head: Normocephalic, atraumatic.   Eyes: Conjunctiva pink, no icterus. Mouth: Oropharyngeal mucosa moist and pink , no lesions erythema or exudate. Neck: Supple without thyromegaly, masses, or lymphadenopathy.  Lungs: Clear to auscultation bilaterally.  Heart: Regular rate and rhythm, no murmurs rubs or gallops.  Abdomen: Bowel sounds are normal, nontender, nondistended, no hepatosplenomegaly or masses, no abdominal bruits or    hernia , no rebound or guarding.   Rectal: Not performed Extremities: No lower extremity edema. No clubbing or deformities.  Neuro: Alert and oriented x 4 , grossly normal neurologically.  Skin: Warm and dry, no rash or jaundice.   Psych: Alert and cooperative, normal mood and affect.  Labs: Lab Results  Component Value Date   CREATININE 0.77 06/11/2014   BUN 8 06/11/2014   NA 144 06/11/2014   K 3.6* 06/11/2014   CL 107 06/11/2014   CO2 25 06/11/2014   Lab Results  Component Value Date   ALT 14 06/11/2014   AST 17 06/11/2014   ALKPHOS 55 06/11/2014   BILITOT 0.2* 06/11/2014   Lab Results  Component Value Date   WBC 8.8 09/13/2013   HGB 13.8 12/14/2013   HCT 38.4 12/14/2013    MCV 92.4 09/13/2013   PLT 178 09/13/2013     Imaging Studies: No results found.

## 2014-08-29 NOTE — Assessment & Plan Note (Signed)
Doing well on linzess 24mcg daily.

## 2014-08-29 NOTE — Assessment & Plan Note (Addendum)
History of small prepyloric antral ulcer in October 2014 overdue for surveillance. She did not have her EGD in January due to her fear of being put to sleep. She continues to use BC powders every 4 hours for chronic headaches. Complains of nausea and fatigue. Dexilant helps stomach issues a lot. Denies dysphagia. Plan on EGD with deep sedation due to failed standard sedation in the past.  I have discussed the risks, alternatives, benefits with regards to but not limited to the risk of reaction to medication, bleeding, infection, perforation and the patient is agreeable to proceed. Written consent to be obtained.  Continue Dexilant. Refrain from BCs, ASA powders, NSAIDS. Send to neurologist for headache work-up and management.  For fatigue, check electrolytes, cbc, tsh.

## 2014-08-31 ENCOUNTER — Encounter (HOSPITAL_COMMUNITY): Payer: Self-pay | Admitting: Pharmacy Technician

## 2014-08-31 NOTE — Patient Instructions (Signed)
Debbie [name]  08/31/2014   Your procedure is scheduled on:  09/06/14  Report to [facility] at 12:45 PM.  Call this number if you have problems the morning of surgery: [phone]   Remember:   Do not eat food or drink liquids after midnight.   Take these medicines the morning of surgery with A SIP OF WATER: DEXILANT, LINZESS, ALBUTEROL INHALER (AND BRING WITH YOU)   Do not wear jewelry, make-up or nail polish.  Do not wear lotions, powders, or perfumes. You may wear deodorant.  Do not shave 48 hours prior to surgery. Men may shave face and neck.  Do not bring valuables to the hospital.  [facility] is not responsible for any belongings or valuables.               Contacts, dentures or bridgework may not be worn into surgery.  Leave suitcase in the car. After surgery it may be brought to your room.  For patients admitted to the hospital, discharge time is determined by your treatment team.               Patients discharged the day of surgery will not be allowed to drive home.  Name and phone number of your driver:   Special Instructions: N/A   Please read over the following fact sheets that you were given: Anesthesia Post-op Instructions   PATIENT INSTRUCTIONS POST-ANESTHESIA  IMMEDIATELY FOLLOWING SURGERY:  Do not drive or operate machinery for the first twenty four hours after surgery.  Do not make any important decisions for twenty four hours after surgery or while taking narcotic pain medications or sedatives.  If you develop intractable nausea and vomiting or a severe headache please notify your doctor immediately.  FOLLOW-UP:  Please make an appointment with your surgeon as instructed. You do not need to follow up with anesthesia unless specifically instructed to do so.  WOUND CARE INSTRUCTIONS (if applicable):  Keep a dry clean dressing on the anesthesia/puncture wound site if there is drainage.  Once the wound has quit draining you may leave it open to air.  Generally you  should leave the bandage intact for twenty four hours unless there is drainage.  If the epidural site drains for more than 36-48 hours please call the anesthesia department.  QUESTIONS?:  Please feel free to call your physician or the hospital operator if you have any questions, and they will be happy to assist you.      Esophagogastroduodenoscopy Esophagogastroduodenoscopy (EGD) is a procedure to examine the lining of the esophagus, stomach, and first part of the small intestine (duodenum). A long, flexible, lighted tube with a camera attached (endoscope) is inserted down the throat to view these organs. This procedure is done to detect problems or abnormalities, such as inflammation, bleeding, ulcers, or growths, in order to treat them. The procedure lasts about 5-20 minutes. It is usually an outpatient procedure, but it may need to be performed in emergency cases in the hospital. LET YOUR CAREGIVER KNOW ABOUT:   Allergies to food or medicine.  All medicines you are taking, including vitamins, herbs, eyedrops, and over-the-counter medicines and creams.  Use of steroids (by mouth or creams).  Previous problems you or members of your family have had with the use of anesthetics.  Any blood disorders you have.  Previous surgeries you have had.  Other health problems you have.  Possibility of pregnancy, if this applies. RISKS AND COMPLICATIONS  Generally, EGD is a safe procedure. However,  as with any procedure, complications can occur. Possible complications include:  Infection.  Bleeding.  Tearing (perforation) of the esophagus, stomach, or duodenum.  Difficulty breathing or not being able to breath.  Excessive sweating.  Spasms of the larynx.  Slowed heartbeat.  Low blood pressure. BEFORE THE PROCEDURE  Do not eat or drink anything for 6-8 hours before the procedure or as directed by your caregiver.  Ask your caregiver about changing or stopping your regular  medicines.  If you wear dentures, be prepared to remove them before the procedure.  Arrange for someone to drive you home after the procedure. PROCEDURE   A vein will be accessed to give medicines and fluids. A medicine to relax you (sedative) and a pain reliever will be given through that access into the vein.  A numbing medicine (local anesthetic) may be sprayed on your throat for comfort and to stop you from gagging or coughing.  A mouth guard may be placed in your mouth to protect your teeth and to keep you from biting on the endoscope.  You will be asked to lie on your left side.  The endoscope is inserted down your throat and into the esophagus, stomach, and duodenum.  Air is put through the endoscope to allow your caregiver to view the lining of your esophagus clearly.  The esophagus, stomach, and duodenum is then examined. During the exam, your caregiver may:  Remove tissue to be examined under a microscope (biopsy) for inflammation, infection, or other medical problems.  Remove growths.  Remove objects (foreign bodies) that are stuck.  Treat any bleeding with medicines or other devices that stop tissues from bleeding (hot cautery, clipping devices).  Widen (dilate) or stretch narrowed areas of the esophagus and stomach.  The endoscope will then be withdrawn. AFTER THE PROCEDURE  You will be taken to a recovery area to be monitored. You will be able to go home once you are stable and alert.  Do not eat or drink anything until the local anesthetic and numbing medicines have worn off. You may choke.  It is normal to feel bloated, have pain with swallowing, or have a sore throat for a short time. This will wear off.  Your caregiver should be able to discuss his or her findings with you. It will take longer to discuss the test results if any biopsies were taken. Document Released: 03/19/2005 Document Revised: 04/02/2014 Document Reviewed: 10/19/2012 [facility] Patient  Information 2015 [place], [place]. This information is not intended to replace advice given to you by your health care provider. Make sure you discuss any questions you have with your health care provider.

## 2014-09-03 ENCOUNTER — Encounter (HOSPITAL_COMMUNITY)
Admission: RE | Admit: 2014-09-03 | Discharge: 2014-09-03 | Disposition: A | Discharge status: Home / Self Care | Payer: Medicaid Other | Source: Ambulatory Visit | Attending: Internal Medicine | Admitting: Internal Medicine

## 2014-09-03 ENCOUNTER — Telehealth: Payer: Self-pay | Admitting: Internal Medicine

## 2014-09-03 NOTE — Telephone Encounter (Signed)
Talked with patient and she did not understand. She will go in the morning at 8 per [facility]

## 2014-09-03 NOTE — Telephone Encounter (Signed)
Tommie from [facility] called and stated patient did not show up for her pre-op appointment for proceduer on 09/05/14

## 2014-09-03 NOTE — Pre-Procedure Instructions (Signed)
Patient did not show for pre op appointment. Dr Coralee North office notified.  Attempted to contact patient without an answer.  Unable to leave a message.

## 2014-09-04 ENCOUNTER — Encounter (HOSPITAL_COMMUNITY)
Admission: RE | Admit: 2014-09-04 | Discharge: 2014-09-04 | Disposition: A | Discharge status: Home / Self Care | Payer: Medicaid Other | Source: Ambulatory Visit | Attending: Internal Medicine | Admitting: Internal Medicine

## 2014-09-04 ENCOUNTER — Encounter (HOSPITAL_COMMUNITY): Payer: Self-pay

## 2014-09-04 DIAGNOSIS — K589 Irritable bowel syndrome without diarrhea: Secondary | ICD-10-CM | POA: Diagnosis not present

## 2014-09-04 DIAGNOSIS — K219 Gastro-esophageal reflux disease without esophagitis: Secondary | ICD-10-CM | POA: Diagnosis not present

## 2014-09-04 DIAGNOSIS — R12 Heartburn: Secondary | ICD-10-CM | POA: Diagnosis not present

## 2014-09-04 DIAGNOSIS — K208 Other esophagitis: Secondary | ICD-10-CM | POA: Diagnosis not present

## 2014-09-04 DIAGNOSIS — G43909 Migraine, unspecified, not intractable, without status migrainosus: Secondary | ICD-10-CM | POA: Diagnosis not present

## 2014-09-04 DIAGNOSIS — J45909 Unspecified asthma, uncomplicated: Secondary | ICD-10-CM | POA: Diagnosis not present

## 2014-09-04 DIAGNOSIS — R1319 Other dysphagia: Secondary | ICD-10-CM | POA: Diagnosis not present

## 2014-09-04 DIAGNOSIS — Z886 Allergy status to analgesic agent status: Secondary | ICD-10-CM | POA: Diagnosis not present

## 2014-09-04 DIAGNOSIS — N3281 Overactive bladder: Secondary | ICD-10-CM | POA: Diagnosis not present

## 2014-09-04 DIAGNOSIS — Z888 Allergy status to other drugs, medicaments and biological substances status: Secondary | ICD-10-CM | POA: Diagnosis not present

## 2014-09-04 LAB — BASIC METABOLIC PANEL
Anion gap: 15 (ref 5–15)
BUN: 8 mg/dL (ref 6–23)
CO2: 22 mEq/L (ref 19–32)
Calcium: 9.8 mg/dL (ref 8.4–10.5)
Chloride: 104 mEq/L (ref 96–112)
Creatinine, Ser: 0.78 mg/dL (ref 0.50–1.10)
GFR calc Af Amer: >90 mL/min (ref >90)
GFR calc non Af Amer: >90 mL/min (ref >90)
Glucose, Bld: 106 mg/dL — ABNORMAL HIGH (ref 70–99)
Potassium: 3.6 mEq/L — ABNORMAL LOW (ref 3.7–5.3)
Sodium: 141 mEq/L (ref 137–147)

## 2014-09-04 LAB — HEMOGLOBIN AND HEMATOCRIT, BLOOD
HCT: 40.1 % (ref 36.0–46.0)
Hemoglobin: 13.7 g/dL (ref 12.0–15.0)

## 2014-09-04 LAB — TSH: TSH: 2.47 u[IU]/mL (ref 0.350–4.500)

## 2014-09-05 NOTE — Progress Notes (Signed)
Quick Note:  tsh normal. ______

## 2014-09-06 ENCOUNTER — Ambulatory Visit (HOSPITAL_COMMUNITY): Payer: Medicaid Other | Admitting: Anesthesiology

## 2014-09-06 ENCOUNTER — Encounter (HOSPITAL_COMMUNITY): Payer: Self-pay | Admitting: *Deleted

## 2014-09-06 ENCOUNTER — Ambulatory Visit (HOSPITAL_COMMUNITY)
Admission: RE | Admit: 2014-09-06 | Discharge: 2014-09-06 | Disposition: A | Discharge status: Home / Self Care | Payer: Medicaid Other | Source: Ambulatory Visit | Attending: Internal Medicine | Admitting: Internal Medicine

## 2014-09-06 ENCOUNTER — Encounter (HOSPITAL_COMMUNITY)
Admission: RE | Disposition: A | Discharge status: Home / Self Care | Payer: Self-pay | Source: Ambulatory Visit | Attending: Internal Medicine

## 2014-09-06 ENCOUNTER — Encounter (HOSPITAL_COMMUNITY): Payer: Medicaid Other | Admitting: Anesthesiology

## 2014-09-06 DIAGNOSIS — K208 Other esophagitis: Secondary | ICD-10-CM | POA: Insufficient documentation

## 2014-09-06 DIAGNOSIS — R1319 Other dysphagia: Secondary | ICD-10-CM | POA: Diagnosis not present

## 2014-09-06 DIAGNOSIS — Z886 Allergy status to analgesic agent status: Secondary | ICD-10-CM | POA: Insufficient documentation

## 2014-09-06 DIAGNOSIS — K589 Irritable bowel syndrome without diarrhea: Secondary | ICD-10-CM | POA: Insufficient documentation

## 2014-09-06 DIAGNOSIS — R12 Heartburn: Secondary | ICD-10-CM | POA: Diagnosis not present

## 2014-09-06 DIAGNOSIS — R1310 Dysphagia, unspecified: Secondary | ICD-10-CM

## 2014-09-06 DIAGNOSIS — J45909 Unspecified asthma, uncomplicated: Secondary | ICD-10-CM | POA: Insufficient documentation

## 2014-09-06 DIAGNOSIS — K219 Gastro-esophageal reflux disease without esophagitis: Secondary | ICD-10-CM | POA: Insufficient documentation

## 2014-09-06 DIAGNOSIS — G43909 Migraine, unspecified, not intractable, without status migrainosus: Secondary | ICD-10-CM | POA: Insufficient documentation

## 2014-09-06 DIAGNOSIS — Z888 Allergy status to other drugs, medicaments and biological substances status: Secondary | ICD-10-CM | POA: Insufficient documentation

## 2014-09-06 DIAGNOSIS — N3281 Overactive bladder: Secondary | ICD-10-CM | POA: Insufficient documentation

## 2014-09-06 DIAGNOSIS — K229 Disease of esophagus, unspecified: Secondary | ICD-10-CM

## 2014-09-06 HISTORY — PX: MALONEY DILATION: SHX5535

## 2014-09-06 HISTORY — PX: ESOPHAGOGASTRODUODENOSCOPY (EGD) WITH PROPOFOL: SHX5813

## 2014-09-06 HISTORY — PX: BIOPSY: SHX5522

## 2014-09-06 SURGERY — ESOPHAGOGASTRODUODENOSCOPY (EGD) WITH PROPOFOL
Anesthesia: Monitor Anesthesia Care

## 2014-09-06 MED ORDER — PROPOFOL 10 MG/ML IV BOLUS
INTRAVENOUS | Status: AC
  Filled 2014-09-06: qty 20

## 2014-09-06 MED ORDER — STERILE WATER FOR IRRIGATION IR SOLN
Status: DC | PRN
  Administered 2014-09-06: 13:00:00

## 2014-09-06 MED ORDER — ONDANSETRON HCL 4 MG/2ML IJ SOLN: 4.0000 mg | Freq: Once | INTRAMUSCULAR | Status: DC | PRN

## 2014-09-06 MED ORDER — FENTANYL CITRATE 0.05 MG/ML IJ SOLN: 25.0000 ug | INTRAMUSCULAR | Status: DC | PRN

## 2014-09-06 MED ORDER — MIDAZOLAM HCL 5 MG/5ML IJ SOLN
INTRAMUSCULAR | Status: DC | PRN
  Administered 2014-09-06: 2 mg via INTRAVENOUS

## 2014-09-06 MED ORDER — FENTANYL CITRATE 0.05 MG/ML IJ SOLN
25.0000 ug | INTRAMUSCULAR | Status: AC
  Administered 2014-09-06 (×2): 25 ug via INTRAVENOUS

## 2014-09-06 MED ORDER — ONDANSETRON HCL 4 MG/2ML IJ SOLN
INTRAMUSCULAR | Status: AC
  Filled 2014-09-06: qty 2

## 2014-09-06 MED ORDER — LIDOCAINE VISCOUS 2 % MT SOLN
OROMUCOSAL | Status: AC
  Filled 2014-09-06: qty 15

## 2014-09-06 MED ORDER — LIDOCAINE VISCOUS 2 % MT SOLN
4.0000 mL | Freq: Once | OROMUCOSAL | Status: AC
  Administered 2014-09-06: 4 mL via OROMUCOSAL
  Filled 2014-09-06: qty 5

## 2014-09-06 MED ORDER — LACTATED RINGERS IV SOLN
INTRAVENOUS | Status: DC
  Administered 2014-09-06: 12:00:00 via INTRAVENOUS

## 2014-09-06 MED ORDER — MIDAZOLAM HCL 2 MG/2ML IJ SOLN
1.0000 mg | INTRAMUSCULAR | Status: DC | PRN
  Administered 2014-09-06: 2 mg via INTRAVENOUS

## 2014-09-06 MED ORDER — ONDANSETRON HCL 4 MG/2ML IJ SOLN
4.0000 mg | Freq: Once | INTRAMUSCULAR | Status: AC
Start: 2014-09-06 — End: 2014-09-06
  Administered 2014-09-06: 4 mg via INTRAVENOUS

## 2014-09-06 MED ORDER — MIDAZOLAM HCL 2 MG/2ML IJ SOLN
INTRAMUSCULAR | Status: AC
  Filled 2014-09-06: qty 2

## 2014-09-06 MED ORDER — FENTANYL CITRATE 0.05 MG/ML IJ SOLN
INTRAMUSCULAR | Status: AC
  Filled 2014-09-06: qty 2

## 2014-09-06 MED ORDER — PROPOFOL INFUSION 10 MG/ML OPTIME
INTRAVENOUS | Status: DC | PRN
Start: 2014-09-06 — End: 2014-09-06
  Administered 2014-09-06: 90 ug/kg/min via INTRAVENOUS
  Administered 2014-09-06: 13:00:00 via INTRAVENOUS

## 2014-09-06 SURGICAL SUPPLY — 21 items
BLOCK BITE 60FR ADLT L/F BLUE (MISCELLANEOUS) ×1 IMPLANT
DEVICE CLIP HEMOSTAT 235CM (CLIP) IMPLANT
ELECT REM PT RETURN 9FT ADLT (ELECTROSURGICAL)
ELECTRODE REM PT RTRN 9FT ADLT (ELECTROSURGICAL) IMPLANT
FLOOR PAD 36X40 (MISCELLANEOUS) ×2
FORCEPS BIOP RAD 4 LRG CAP 4 (CUTTING FORCEPS) ×1 IMPLANT
FORMALIN 10 PREFIL 20ML (MISCELLANEOUS) ×1 IMPLANT
KIT CLEAN ENDO COMPLIANCE (KITS) ×1 IMPLANT
MANIFOLD NEPTUNE II (INSTRUMENTS) ×1 IMPLANT
NDL SCLEROTHERAPY 25GX240 (NEEDLE) IMPLANT
NEEDLE SCLEROTHERAPY 25GX240 (NEEDLE) IMPLANT
PAD FLOOR 36X40 (MISCELLANEOUS) IMPLANT
PROBE APC STR FIRE (PROBE) IMPLANT
PROBE INJECTION GOLD (MISCELLANEOUS)
PROBE INJECTION GOLD 7FR (MISCELLANEOUS) IMPLANT
SNARE ROTATE MED OVAL 20MM (MISCELLANEOUS) IMPLANT
SNARE SHORT THROW 13M SML OVAL (MISCELLANEOUS) ×1 IMPLANT
SYR 50ML LL SCALE MARK (SYRINGE) ×1 IMPLANT
SYR INFLATION 60ML (SYRINGE) ×2 IMPLANT
TUBING IRRIGATION ENDOGATOR (MISCELLANEOUS) ×1 IMPLANT
WATER STERILE IRR 1000ML POUR (IV SOLUTION) ×1 IMPLANT

## 2014-09-06 NOTE — Transfer of Care (Signed)
Immediate Anesthesia Transfer of Care Note  Patient: Debbie [name]  Procedure(s) Performed: Procedure(s): ESOPHAGOGASTRODUODENOSCOPY (EGD) WITH PROPOFOL (N/A) MALONEY DILATION; 52-54 french (N/A) BIOPSY (N/A)  Patient Location: PACU  Anesthesia Type:MAC  Level of Consciousness: awake and patient cooperative  Airway & Oxygen Therapy: Patient Spontanous Breathing and Patient connected to face mask oxygen  Post-op Assessment: Report given to PACU RN, Post -op Vital signs reviewed and stable and Patient moving all extremities  Post vital signs: Reviewed and stable  Complications: No apparent anesthesia complications

## 2014-09-06 NOTE — H&P (View-Only) (Signed)
Primary Care Physician:  Redge Gainer, MD  Primary Gastroenterologist:  Garfield Cornea, MD   Chief Complaint  Patient presents with  . Heartburn    HPI:  Debbie [name] is a 38 y.o. female here for followup. Patient reports that Criss Alvine, NP requested she come back to see [place]. Rulon Abide last saw her in January 2015 and plan for surveillance EGD given history of gastric ulcer. Patient states she freaked out about being put to sleep interested in having it done.  History of EGD in October 2014. Found to have Schatzki ring which was not manipulated, gastric ulcer and erosions. History of BC powder use.   [place] working very well. No problem with heartburn on Dexilant. Complains of fatigue. Complains of early morning nausea. Had been using zofran but makes constipated. Takes BC powders every 4 hours for headache. Never been to headache specialist. Never been to [facility], Dr. Redge Gainer, but it is on her Medicaid card. No melena, brbpr. 3-4 times daily on Linzess. [place] 7. Feels better this way. When took QOD, didn't work. Denies abdominal pain. Denies dysphagia. No previous workup of headaches.  Current Outpatient Prescriptions  Medication Sig Dispense Refill  . albuterol (PROVENTIL HFA;VENTOLIN HFA) 108 (90 BASE) MCG/ACT inhaler Inhale 2 puffs into the lungs every 6 (six) hours as needed. For shortness of breath  8.5 g  1  . Aspirin-Salicylamide-Caffeine (BC HEADACHE POWDER PO) Take by mouth.      . DEXILANT 60 MG capsule take 1 capsule once daily  30 capsule  3  . Linaclotide (LINZESS) 290 MCG CAPS capsule Take 1 capsule (290 mcg total) by mouth daily. 30 minutes before breakfast.  30 capsule  11   No current facility-administered medications for this visit.    Allergies as of 08/29/2014 - Review Complete 08/29/2014  Allergen Reaction Noted  . Metronidazole Swelling 08/19/2011  . Advil [ibuprofen] Palpitations 08/19/2011    Past Medical History  Diagnosis Date  .  GERD (gastroesophageal reflux disease)   . Asthma   . Migraines   . IBS (irritable bowel syndrome)   . Constipation   . Hot flashes 09/05/2013    Uses patch  . H/O estrogen therapy 09/05/2013  . Herpes simplex without mention of complication   . LLQ pain 12/28/2013  . OAB (overactive bladder) 12/28/2013    Past Surgical History  Procedure Laterality Date  . Cesarean section    . Abdominal hysterectomy      right SOO  . Colonoscopy N/A 09/25/2013    LGX:[id number] colonic polyp-removed as described above. Tubular adenoma. next TCS 08/2020  . Esophagogastroduodenoscopy N/A 09/25/2013    RMR: Schatzki's ring-not manipulated as outlined above/Hiatal hernia. Gastric ulcer and erosions status post biopsy/Normal duodenum through the third portion. Negative H.pylori    Family History  Problem Relation Age of Onset  . Stroke Mother   . Cancer Father     ?esophageal  . Stroke Father   . Colon cancer Neg Hx   . Lung cancer Maternal Grandfather     History   Social History  . Marital Status: Single    Spouse Name: N/A    Number of Children: 3  . Years of Education: N/A   Occupational History  . unemployed    Social History Main Topics  . Smoking status: Current Every Day Smoker -- 1.50 packs/day for 21 years    Types: Cigarettes  . Smokeless tobacco: Never Used     Comment: cut back to 1/3 pack  per day  . Alcohol Use: No  . Drug Use: Yes    Special: Marijuana     Comment: Marijuana twice a month  . Sexual Activity: Yes    Birth Control/ Protection: Surgical   Other Topics Concern  . Not on file   Social History Narrative  . No narrative on file      ROS:  General: Negative for anorexia, fever, chills. Complains of a 6 pound weight loss, fatigue, weakness. Eyes: Negative for vision changes.  ENT: Negative for hoarseness, difficulty swallowing , nasal congestion. CV: Negative for chest pain, angina, palpitations, dyspnea on exertion, peripheral edema.  Respiratory:  Negative for dyspnea at rest, dyspnea on exertion, cough, sputum, wheezing.  GI: See history of present illness. GU:  Negative for dysuria, hematuria, urinary incontinence, urinary frequency, nocturnal urination.  MS: Negative for joint pain, low back pain.  Derm: Negative for rash or itching.  Neuro: Negative for weakness, abnormal sensation, seizure, frequent headaches, memory loss, confusion.  Psych: Negative for anxiety, depression, suicidal ideation, hallucinations.  Endo: See history of present illness Heme: Negative for bruising or bleeding. Allergy: Negative for rash or hives.    Physical Examination:  BP 136/86  Pulse 81  Temp(Src) 97.3 F (36.3 C)  Ht 5\' 6"  (1.676 m)  Wt 178 lb (80.74 kg)  BMI 28.74 kg/m2   General: Well-nourished, well-developed in no acute distress.  Head: Normocephalic, atraumatic.   Eyes: Conjunctiva pink, no icterus. Mouth: Oropharyngeal mucosa moist and pink , no lesions erythema or exudate. Neck: Supple without thyromegaly, masses, or lymphadenopathy.  Lungs: Clear to auscultation bilaterally.  Heart: Regular rate and rhythm, no murmurs rubs or gallops.  Abdomen: Bowel sounds are normal, nontender, nondistended, no hepatosplenomegaly or masses, no abdominal bruits or    hernia , no rebound or guarding.   Rectal: Not performed Extremities: No lower extremity edema. No clubbing or deformities.  Neuro: Alert and oriented x 4 , grossly normal neurologically.  Skin: Warm and dry, no rash or jaundice.   Psych: Alert and cooperative, normal mood and affect.  Labs: Lab Results  Component Value Date   CREATININE 0.77 06/11/2014   BUN 8 06/11/2014   NA 144 06/11/2014   K 3.6* 06/11/2014   CL 107 06/11/2014   CO2 25 06/11/2014   Lab Results  Component Value Date   ALT 14 06/11/2014   AST 17 06/11/2014   ALKPHOS 55 06/11/2014   BILITOT 0.2* 06/11/2014   Lab Results  Component Value Date   WBC 8.8 09/13/2013   HGB 13.8 12/14/2013   HCT 38.4 12/14/2013    MCV 92.4 09/13/2013   PLT 178 09/13/2013     Imaging Studies: No results found.

## 2014-09-06 NOTE — Anesthesia Preprocedure Evaluation (Signed)
Anesthesia Evaluation  Patient identified by MRN, date of birth, ID band Patient awake    Reviewed: Allergy & Precautions, H&P , NPO status , Patient's Chart, lab work & pertinent test results  Airway Mallampati: II TM Distance: >3 FB     Dental  (+) Teeth Intact   Pulmonary asthma , Current Smoker,  breath sounds clear to auscultation        Cardiovascular negative cardio ROS  Rhythm:Regular Rate:Normal     Neuro/Psych  Headaches,    GI/Hepatic PUD, GERD-  Medicated,  Endo/Other    Renal/GU      Musculoskeletal   Abdominal   Peds  Hematology   Anesthesia Other Findings   Reproductive/Obstetrics                           Anesthesia Physical Anesthesia Plan  ASA: II  Anesthesia Plan: MAC   Post-op Pain Management:    Induction: Intravenous  Airway Management Planned: Simple Face Mask  Additional Equipment:   Intra-op Plan:   Post-operative Plan:   Informed Consent: I have reviewed the patients History and Physical, chart, labs and discussed the procedure including the risks, benefits and alternatives for the proposed anesthesia with the patient or authorized representative who has indicated his/her understanding and acceptance.     Plan Discussed with:   Anesthesia Plan Comments:         Anesthesia Quick Evaluation

## 2014-09-06 NOTE — Op Note (Addendum)
[place], [id number]   ENDOSCOPY PROCEDURE REPORT  PATIENT: Debbie, [name]  MR#: [id number] BIRTHDATE: [birth year] , 38  yrs. old GENDER: female ENDOSCOPIST: R.  Garfield Cornea, MD FACP [facility] REFERRED BY:  Redge Gainer, M.D. PROCEDURE DATE:  24-Sep-2014 PROCEDURE:  EGD w/ biopsy and Maloney dilation of esophagus INDICATIONS:  followup gastric ulcer; esophageal dysphagia. MEDICATIONS: Deep sedation per Dr.  [facility]. ASA CLASS:      Class II  CONSENT: The risks, benefits, limitations, alternatives and imponderables have been discussed.  The potential for biopsy, esophogeal dilation, etc. have also been reviewed.  Questions have been answered.  All parties agreeable.  Please see the history and physical in the medical record for more information.  DESCRIPTION OF PROCEDURE: After the risks benefits and alternatives of the procedure were thoroughly explained, informed consent was obtained.  The    endoscope was introduced through the mouth and advanced to the second portion of the duodenum , limited by Without limitations. The instrument was slowly withdrawn as the mucosa was fully examined.    Patient had a "ringed" appearing esophagus.  Esophagus remained patent throughout its course.  Otherwise, mucosa appeared normal. Stomach empty.  Antral erosions.  No ulcer.  No infiltrating process.  Patent pylorus.  Normal first and second portion of the duodenum  A 52 French Maloney dilator was passed to full insertion with mild resistance.  A look back revealed no apparent trauma whatsoever related to this maneuver.   Subsequently, a 41 French Maloney dilator was passed to full insertion with mild to moderate resistance.  A low back revealed, again, no apparent trauma related to this maneuver.  Subsequently, biopsies  of the mid and distal esophagus were taken for histologic study.  Retroflexed views revealed as previously  described.     The scope was then withdrawn from the patient and the procedure completed.  COMPLICATIONS: There were no immediate complications.  ENDOSCOPIC IMPRESSION: Abnormal esophagus as described above-status post Maloney dilation followed by esophageal biopsy. Antral erosions only-previously noted gastric ulcer healed  RECOMMENDATIONS: Continue Dexilant 60 mg daily. Avoid nonsteroidals as much as possible. Office visit with [place] in 6 months. Followup on pathology.  REPEAT EXAM:  eSigned:  R. Garfield Cornea, MD Rosalita Chessman [facility] September 24, 2014 1:55 PM Revised: 09/24/14 1:55 PM   CC:  CPT CODES: ICD CODES:  The ICD and CPT codes recommended by this software are interpretations from the data that the clinical staff has captured with the software.  The verification of the translation of this report to the ICD and CPT codes and modifiers is the sole responsibility of the health care institution and practicing physician where this report was generated.  [place]. will not be held responsible for the validity of the ICD and CPT codes included on this report.  AMA assumes no liability for data contained or not contained herein. CPT is a Designer, television/film set of the [employer].  PATIENT NAME:  Debbie, [name] MR#: [id number]

## 2014-09-06 NOTE — Interval H&P Note (Signed)
History and Physical Interval Note:  09/06/2014 12:49 PM  Debbie [name]  has presented today for surgery, with the diagnosis of nausea/history of PUD/fatiuge  The various methods of treatment have been discussed with the patient and family. After consideration of risks, benefits and other options for treatment, the patient has consented to  Procedure(s) with comments: ESOPHAGOGASTRODUODENOSCOPY (EGD) WITH PROPOFOL (N/A) - 215 as a surgical intervention .  The patient's history has been reviewed, patient examined, no change in status, stable for surgery.  I have reviewed the patient's chart and labs.  Questions were answered to the patient's satisfaction.     Patient reports esophageal dysphagia to pills-history of a known Schatzki's ring. Plan for EGD with esophageal dilation as appropriate.The risks, benefits, limitations, alternatives and imponderables have been reviewed with the patient. Potential for esophageal dilation, biopsy, etc. have also been reviewed.  Questions have been answered. All parties agreeable.  Manus Rudd

## 2014-09-06 NOTE — Anesthesia Postprocedure Evaluation (Signed)
  Anesthesia Post-op Note  Patient: Debbie [name]  Procedure(s) Performed: Procedure(s): ESOPHAGOGASTRODUODENOSCOPY (EGD) WITH PROPOFOL (N/A) MALONEY DILATION; 52-54 french (N/A) BIOPSY (N/A)  Patient Location: PACU  Anesthesia Type:MAC  Level of Consciousness: awake, alert , oriented and patient cooperative  Airway and Oxygen Therapy: Patient Spontanous Breathing  Post-op Pain: none  Post-op Assessment: Post-op Vital signs reviewed, Patient's Cardiovascular Status Stable, Respiratory Function Stable, Patent Airway and No signs of Nausea or vomiting  Post-op Vital Signs: Reviewed and stable  Last Vitals:  Filed Vitals:   09/06/14 1300  BP: 134/97  Pulse:   Temp:   Resp: 13    Complications: No apparent anesthesia complications

## 2014-09-06 NOTE — Discharge Instructions (Signed)
EGD Discharge instructions Please read the instructions outlined below and refer to this sheet in the next few weeks. These discharge instructions provide you with general information on caring for yourself after you leave the hospital. Your doctor may also give you specific instructions. While your treatment has been planned according to the most current medical practices available, unavoidable complications occasionally occur. If you have any problems or questions after discharge, please call your doctor. ACTIVITY  You may resume your regular activity but move at a slower pace for the next 24 hours.   Take frequent rest periods for the next 24 hours.   Walking will help expel (get rid of) the air and reduce the bloated feeling in your abdomen.   No driving for 24 hours (because of the anesthesia (medicine) used during the test).   You may shower.   Do not sign any important legal documents or operate any machinery for 24 hours (because of the anesthesia used during the test).  NUTRITION  Drink plenty of fluids.   You may resume your normal diet.   Begin with a light meal and progress to your normal diet.   Avoid alcoholic beverages for 24 hours or as instructed by your caregiver.  MEDICATIONS  You may resume your normal medications unless your caregiver tells you otherwise.  WHAT YOU CAN EXPECT TODAY  You may experience abdominal discomfort such as a feeling of fullness or gas pains.  FOLLOW-UP  Your doctor will discuss the results of your test with you.  SEEK IMMEDIATE MEDICAL ATTENTION IF ANY OF THE FOLLOWING OCCUR:  Excessive nausea (feeling sick to your stomach) and/or vomiting.   Severe abdominal pain and distention (swelling).   Trouble swallowing.   Temperature over 101 F (37.8 C).   Rectal bleeding or vomiting of blood.    Continue Dexilant 60 mg daily  Avoid aspirin and nonsteroidal agents as much as possible.  Office visit with [place] in 6  months  Further recommendations to follow pending review of pathology

## 2014-09-07 ENCOUNTER — Encounter (HOSPITAL_COMMUNITY): Payer: Self-pay | Admitting: Internal Medicine

## 2014-09-11 ENCOUNTER — Telehealth: Payer: Self-pay

## 2014-09-11 NOTE — Telephone Encounter (Signed)
Raquel Sarna from [facility] office called and stated that they have not been able to get in touch with the patient.  If you have any questions call [phone]

## 2014-09-13 NOTE — Telephone Encounter (Signed)
Tried to call patient with no answer  

## 2014-09-17 ENCOUNTER — Encounter: Payer: Self-pay | Admitting: Internal Medicine

## 2014-09-17 NOTE — Telephone Encounter (Signed)
Tried to call again with no answer  

## 2014-09-19 ENCOUNTER — Encounter (HOSPITAL_COMMUNITY): Payer: Self-pay | Admitting: Internal Medicine

## 2014-09-20 ENCOUNTER — Encounter: Payer: Self-pay | Admitting: General Practice

## 2014-09-20 NOTE — Telephone Encounter (Signed)
Per Dawn at Dr. Freddie Apley office they are unable to reach the patient by phone.

## 2014-10-01 ENCOUNTER — Encounter (HOSPITAL_COMMUNITY): Payer: Self-pay | Admitting: Internal Medicine

## 2014-12-18 ENCOUNTER — Other Ambulatory Visit: Payer: Self-pay | Admitting: Gastroenterology

## 2015-01-16 ENCOUNTER — Ambulatory Visit: Payer: Medicaid Other | Admitting: Adult Health

## 2015-01-16 ENCOUNTER — Other Ambulatory Visit: Payer: Medicaid Other

## 2015-03-06 ENCOUNTER — Telehealth: Payer: Self-pay | Admitting: Family Medicine

## 2015-03-06 NOTE — Telephone Encounter (Signed)
Patient states she is having left side pain and having chest pain. Patient is on linzess and dexilant and she this medication is not helping. Patient said she has been having sharp pains in her chest. Patient advised to go to ER for evaluation of chest pain.

## 2015-04-19 ENCOUNTER — Other Ambulatory Visit: Payer: Self-pay | Admitting: Gastroenterology

## 2015-05-16 ENCOUNTER — Telehealth: Payer: Self-pay | Admitting: Family Medicine

## 2015-05-24 ENCOUNTER — Encounter: Payer: Self-pay | Admitting: Adult Health

## 2015-05-24 ENCOUNTER — Ambulatory Visit (INDEPENDENT_AMBULATORY_CARE_PROVIDER_SITE_OTHER): Payer: Medicaid Other | Admitting: Adult Health

## 2015-05-24 VITALS — BP 132/98 | HR 80 | Ht 66.0 in | Wt 181.0 lb

## 2015-05-24 DIAGNOSIS — F32A Depression, unspecified: Secondary | ICD-10-CM

## 2015-05-24 DIAGNOSIS — B9689 Other specified bacterial agents as the cause of diseases classified elsewhere: Secondary | ICD-10-CM

## 2015-05-24 DIAGNOSIS — A499 Bacterial infection, unspecified: Secondary | ICD-10-CM

## 2015-05-24 DIAGNOSIS — N760 Acute vaginitis: Secondary | ICD-10-CM

## 2015-05-24 DIAGNOSIS — F329 Major depressive disorder, single episode, unspecified: Secondary | ICD-10-CM

## 2015-05-24 DIAGNOSIS — R300 Dysuria: Secondary | ICD-10-CM | POA: Diagnosis not present

## 2015-05-24 DIAGNOSIS — I10 Essential (primary) hypertension: Secondary | ICD-10-CM | POA: Diagnosis not present

## 2015-05-24 DIAGNOSIS — R319 Hematuria, unspecified: Secondary | ICD-10-CM

## 2015-05-24 DIAGNOSIS — N898 Other specified noninflammatory disorders of vagina: Secondary | ICD-10-CM

## 2015-05-24 HISTORY — DX: Depression, unspecified: F32.A

## 2015-05-24 HISTORY — DX: Essential (primary) hypertension: I10

## 2015-05-24 HISTORY — DX: Dysuria: R30.0

## 2015-05-24 HISTORY — DX: Other specified noninflammatory disorders of vagina: N89.8

## 2015-05-24 HISTORY — DX: Other specified bacterial agents as the cause of diseases classified elsewhere: B96.89

## 2015-05-24 HISTORY — DX: Hematuria, unspecified: R31.9

## 2015-05-24 LAB — POCT URINALYSIS DIPSTICK
Glucose, UA: NEGATIVE
Nitrite, UA: NEGATIVE
Protein, UA: NEGATIVE

## 2015-05-24 LAB — POCT WET PREP (WET MOUNT): WBC, Wet Prep HPF POC: POSITIVE

## 2015-05-24 MED ORDER — ESCITALOPRAM OXALATE 10 MG PO TABS: 10.0000 mg | ORAL_TABLET | Freq: Every day | ORAL | Status: DC

## 2015-05-24 MED ORDER — HYDROCHLOROTHIAZIDE 12.5 MG PO CAPS: 12.5000 mg | ORAL_CAPSULE | Freq: Every day | ORAL | Status: DC

## 2015-05-24 MED ORDER — TINIDAZOLE 500 MG PO TABS: ORAL_TABLET | ORAL | Status: DC

## 2015-05-24 NOTE — Patient Instructions (Signed)
Depression Depression refers to feeling sad, low, down in the dumps, blue, gloomy, or empty. In general, there are two kinds of depression: 1. Normal sadness or normal grief. This kind of depression is one that we all feel from time to time after upsetting life experiences, such as the loss of a job or the ending of a relationship. This kind of depression is considered normal, is short lived, and resolves within a few days to 2 weeks. Depression experienced after the loss of a loved one (bereavement) often lasts longer than 2 weeks but normally gets better with time. 2. Clinical depression. This kind of depression lasts longer than normal sadness or normal grief or interferes with your ability to function at home, at work, and in school. It also interferes with your personal relationships. It affects almost every aspect of your life. Clinical depression is an illness. Symptoms of depression can also be caused by conditions other than those mentioned above, such as:  Physical illness. Some physical illnesses, including underactive thyroid gland (hypothyroidism), severe anemia, specific types of cancer, diabetes, uncontrolled seizures, heart and lung problems, strokes, and chronic pain are commonly associated with symptoms of depression.  Side effects of some prescription medicine. In some people, certain types of medicine can cause symptoms of depression.  Substance abuse. Abuse of alcohol and illicit drugs can cause symptoms of depression. SYMPTOMS Symptoms of normal sadness and normal grief include the following:  Feeling sad or crying for short periods of time.  Not caring about anything (apathy).  Difficulty sleeping or sleeping too much.  No longer able to enjoy the things you used to enjoy.  Desire to be by oneself all the time (social isolation).  Lack of energy or motivation.  Difficulty concentrating or remembering.  Change in appetite or weight.  Restlessness or  agitation. Symptoms of clinical depression include the same symptoms of normal sadness or normal grief and also the following symptoms:  Feeling sad or crying all the time.  Feelings of guilt or worthlessness.  Feelings of hopelessness or helplessness.  Thoughts of suicide or the desire to harm yourself (suicidal ideation).  Loss of touch with reality (psychotic symptoms). Seeing or hearing things that are not real (hallucinations) or having false beliefs about your life or the people around you (delusions and paranoia). DIAGNOSIS  The diagnosis of clinical depression is usually based on how bad the symptoms are and how long they have lasted. Your health care provider will also ask you questions about your medical history and substance use to find out if physical illness, use of prescription medicine, or substance abuse is causing your depression. Your health care provider may also order blood tests. TREATMENT  Often, normal sadness and normal grief do not require treatment. However, sometimes antidepressant medicine is given for bereavement to ease the depressive symptoms until they resolve. The treatment for clinical depression depends on how bad the symptoms are but often includes antidepressant medicine, counseling with a mental health professional, or both. Your health care provider will help to determine what treatment is best for you. Depression caused by physical illness usually goes away with appropriate medical treatment of the illness. If prescription medicine is causing depression, talk with your health care provider about stopping the medicine, decreasing the dose, or changing to another medicine. Depression caused by the abuse of alcohol or illicit drugs goes away when you stop using these substances. Some adults need professional help in order to stop drinking or using drugs. SEEK IMMEDIATE MEDICAL   CARE IF:  You have thoughts about hurting yourself or others.  You lose touch  with reality (have psychotic symptoms).  You are taking medicine for depression and have a serious side effect. FOR MORE INFORMATION  National Alliance on Mental Illness: www.nami.[employer] of Mental Health: https://carter.com/ Document Released: 11/13/2000 Document Revised: 04/02/2014 Document Reviewed: 02/15/2012 [facility] Patient Information 2015 [place], [place]. This information is not intended to replace advice given to you by your health care provider. Make sure you discuss any questions you have with your health care provider. Hypertension Hypertension, commonly called high blood pressure, is when the force of blood pumping through your arteries is too strong. Your arteries are the blood vessels that carry blood from your heart throughout your body. A blood pressure reading consists of a higher number over a lower number, such as 110/72. The higher number (systolic) is the pressure inside your arteries when your heart pumps. The lower number (diastolic) is the pressure inside your arteries when your heart relaxes. Ideally you want your blood pressure below 120/80. Hypertension forces your heart to work harder to pump blood. Your arteries may become narrow or stiff. Having hypertension puts you at risk for heart disease, stroke, and other problems.  RISK FACTORS Some risk factors for high blood pressure are controllable. Others are not.  Risk factors you cannot control include:  3. Race. You may be at higher risk if you are African American. 4. Age. Risk increases with age. 5. Gender. Men are at higher risk than women before age 63 years. After age 64, women are at higher risk than men. Risk factors you can control include:  Not getting enough exercise or physical activity.  Being overweight.  Getting too much fat, sugar, calories, or salt in your diet.  Drinking too much alcohol. SIGNS AND SYMPTOMS Hypertension does not usually cause signs or symptoms. Extremely high  blood pressure (hypertensive crisis) may cause headache, anxiety, shortness of breath, and nosebleed. DIAGNOSIS  To check if you have hypertension, your health care provider will measure your blood pressure while you are seated, with your arm held at the level of your heart. It should be measured at least twice using the same arm. Certain conditions can cause a difference in blood pressure between your right and left arms. A blood pressure reading that is higher than normal on one occasion does not mean that you need treatment. If one blood pressure reading is high, ask your health care provider about having it checked again. TREATMENT  Treating high blood pressure includes making lifestyle changes and possibly taking medicine. Living a healthy lifestyle can help lower high blood pressure. You may need to change some of your habits. Lifestyle changes may include:  Following the DASH diet. This diet is high in fruits, vegetables, and whole grains. It is low in salt, red meat, and added sugars.  Getting at least 2 hours of brisk physical activity every week.  Losing weight if necessary.  Not smoking.  Limiting alcoholic beverages.  Learning ways to reduce stress. If lifestyle changes are not enough to get your blood pressure under control, your health care provider may prescribe medicine. You may need to take more than one. Work closely with your health care provider to understand the risks and benefits. HOME CARE INSTRUCTIONS  Have your blood pressure rechecked as directed by your health care provider.   Take medicines only as directed by your health care provider. Follow the directions carefully. Blood pressure medicines must  be taken as prescribed. The medicine does not work as well when you skip doses. Skipping doses also puts you at risk for problems.   Do not smoke.   Monitor your blood pressure at home as directed by your health care provider. SEEK MEDICAL CARE IF:   You  think you are having a reaction to medicines taken.  You have recurrent headaches or feel dizzy.  You have swelling in your ankles.  You have trouble with your vision. SEEK IMMEDIATE MEDICAL CARE IF:  You develop a severe headache or confusion.  You have unusual weakness, numbness, or feel faint.  You have severe chest or abdominal pain.  You vomit repeatedly.  You have trouble breathing. MAKE SURE YOU:   Understand these instructions.  Will watch your condition.  Will get help right away if you are not doing well or get worse. Document Released: 11/16/2005 Document Revised: 04/02/2014 Document Reviewed: 09/08/2013 [facility] Patient Information 2015 [place], [place]. This information is not intended to replace advice given to you by your health care provider. Make sure you discuss any questions you have with your health care provider. Bacterial Vaginosis Bacterial vaginosis is a vaginal infection that occurs when the normal balance of bacteria in the vagina is disrupted. It results from an overgrowth of certain bacteria. This is the most common vaginal infection in women of childbearing age. Treatment is important to prevent complications, especially in pregnant women, as it can cause a premature delivery. CAUSES  Bacterial vaginosis is caused by an increase in harmful bacteria that are normally present in smaller amounts in the vagina. Several different kinds of bacteria can cause bacterial vaginosis. However, the reason that the condition develops is not fully understood. RISK FACTORS Certain activities or behaviors can put you at an increased risk of developing bacterial vaginosis, including: 6. Having a new sex partner or multiple sex partners. 7. Douching. 8. Using an intrauterine device (IUD) for contraception. Women do not get bacterial vaginosis from toilet seats, bedding, swimming pools, or contact with objects around them. SIGNS AND SYMPTOMS  Some women with bacterial  vaginosis have no signs or symptoms. Common symptoms include:  Grey vaginal discharge.  A fishlike odor with discharge, especially after sexual intercourse.  Itching or burning of the vagina and vulva.  Burning or pain with urination. DIAGNOSIS  Your health care provider will take a medical history and examine the vagina for signs of bacterial vaginosis. A sample of vaginal fluid may be taken. Your health care provider will look at this sample under a microscope to check for bacteria and abnormal cells. A vaginal pH test may also be done.  TREATMENT  Bacterial vaginosis may be treated with antibiotic medicines. These may be given in the form of a pill or a vaginal cream. A second round of antibiotics may be prescribed if the condition comes back after treatment.  HOME CARE INSTRUCTIONS   Only take over-the-counter or prescription medicines as directed by your health care provider.  If antibiotic medicine was prescribed, take it as directed. Make sure you finish it even if you start to feel better.  Do not have sex until treatment is completed.  Tell all sexual partners that you have a vaginal infection. They should see their health care provider and be treated if they have problems, such as a mild rash or itching.  Practice safe sex by using condoms and only having one sex partner. SEEK MEDICAL CARE IF:   Your symptoms are not improving after 3  days of treatment.  You have increased discharge or pain.  You have a fever. MAKE SURE YOU:   Understand these instructions.  Will watch your condition.  Will get help right away if you are not doing well or get worse. FOR MORE INFORMATION  Centers for Disease Control and Prevention, Division of STD Prevention: AppraiserFraud.fi American Sexual Health Association (ASHA): www.ashastd.org  Document Released: 11/16/2005 Document Revised: 09/06/2013 Document Reviewed: 06/28/2013 [facility] Patient Information 2015 [place], [place]. This  information is not intended to replace advice given to you by your health care provider. Make sure you discuss any questions you have with your health care provider. Take meds Follow up with me in 4 weeks

## 2015-05-24 NOTE — Progress Notes (Signed)
Subjective:     Patient ID: Debbie [name], female   DOB: 1975/12/03, 39 y.o.   MRN: [id number]  HPI Debbie [name] is a 39 year old white female in complaining of vaginal discharge, itching at times and swelling and burning with urination.Has nausea at times.  Review of Systems Patient denies any headaches, hearing loss, fatigue, blurred vision, shortness of breath, chest pain, abdominal pain, problems with bowel movements,  or intercourse. No joint pain or mood swings, she did say she feels bad at times and gets teary and about 3 months ago, thought of jumping off bridge, but is not suicidal now, see HPI for other positives.  Reviewed past medical,surgical, social and family history. Reviewed medications and allergies.     Objective:   Physical Exam BP 132/98 mmHg  Pulse 80  Ht 5\' 6"  (1.676 m)  Wt 181 lb (82.101 kg)  BMI 29.23 kg/m2 BP was 142/100 on arrival, urine trace leuks, trace blood, Skin warm and dry, Neck: mid line trachea, normal thyroid, good ROM, no lymphadenopathy noted. Lungs: clear to ausculation bilaterally. Cardiovascular: regular rate and rhythm..Pelvic: external genitalia is normal in appearance no lesions, vagina: white discharge with odor,urethra has no lesions or masses noted, cervix and uterus are absent, adnexa: no masses or tenderness noted. Bladder is non tender and no masses felt. Wet prep: + for clue cells and +WBCs.   PHQ score 26.Discussed need to get on BP meds and will try lexapro for depression, and will refer for counseling if desires, she declines for now.Can take tindamax will rx it and will send urine for C&S.  Assessment:     Vaginal discharge BV Burning with urination Hematuria Hypertension Depression     Plan:    Rx lexapro 10 mg #30 take 1 daily with 6 refills Rx microzide 12.5 mg #30 take 1 daily with 11 refills Rx tindamax 500 mg #8 take 4 now and 4 in am Follow up in 4 weeks or before if needed  UA C&S sent    Check CBC,CMP and TSH  Review  handout on BV, hypertension and depression

## 2015-05-25 LAB — CBC
Hematocrit: 39.2 % (ref 34.0–46.6)
Hemoglobin: 13.2 g/dL (ref 11.1–15.9)
MCH: 31.7 pg (ref 26.6–33.0)
MCHC: 33.7 g/dL (ref 31.5–35.7)
MCV: 94 fL (ref 79–97)
Platelets: 252 10*3/uL (ref 150–379)
RBC: 4.17 x10E6/uL (ref 3.77–5.28)
RDW: 13.9 % (ref 12.3–15.4)
WBC: 4.9 10*3/uL (ref 3.4–10.8)

## 2015-05-25 LAB — URINALYSIS, ROUTINE W REFLEX MICROSCOPIC
Bilirubin, UA: NEGATIVE
Glucose, UA: NEGATIVE
Ketones, UA: NEGATIVE
Nitrite, UA: NEGATIVE
Protein, UA: NEGATIVE
RBC, UA: NEGATIVE
Specific Gravity, UA: 1.024 (ref 1.005–1.030)
Urobilinogen, Ur: 1 mg/dL (ref 0.2–1.0)
pH, UA: 6 (ref 5.0–7.5)

## 2015-05-25 LAB — MICROSCOPIC EXAMINATION: Casts: NONE SEEN /lpf

## 2015-05-25 LAB — COMPREHENSIVE METABOLIC PANEL
ALT: 19 IU/L (ref 0–32)
AST: 21 IU/L (ref 0–40)
Albumin/Globulin Ratio: 1.9 (ref 1.1–2.5)
Albumin: 4.3 g/dL (ref 3.5–5.5)
Alkaline Phosphatase: 56 IU/L (ref 39–117)
BUN/Creatinine Ratio: 7 — ABNORMAL LOW (ref 8–20)
BUN: 4 mg/dL — ABNORMAL LOW (ref 6–20)
Bilirubin Total: 0.2 mg/dL (ref 0.0–1.2)
CO2: 23 mmol/L (ref 18–29)
Calcium: 9.4 mg/dL (ref 8.7–10.2)
Chloride: 104 mmol/L (ref 97–108)
Creatinine, Ser: 0.58 mg/dL (ref 0.57–1.00)
GFR calc Af Amer: 134 mL/min/{1.73_m2} (ref >59)
GFR calc non Af Amer: 116 mL/min/{1.73_m2} (ref >59)
Globulin, Total: 2.3 g/dL (ref 1.5–4.5)
Glucose: 83 mg/dL (ref 65–99)
Potassium: 3.9 mmol/L (ref 3.5–5.2)
Sodium: 144 mmol/L (ref 134–144)
Total Protein: 6.6 g/dL (ref 6.0–8.5)

## 2015-05-25 LAB — TSH: TSH: 1.6 u[IU]/mL (ref 0.450–4.500)

## 2015-05-26 LAB — URINE CULTURE: Organism ID, Bacteria: NO GROWTH

## 2015-05-27 ENCOUNTER — Telehealth: Payer: Self-pay | Admitting: Adult Health

## 2015-05-27 NOTE — Telephone Encounter (Signed)
Left message about labs

## 2015-05-30 NOTE — Telephone Encounter (Signed)
Unable to reach patient by phone 

## 2015-06-10 ENCOUNTER — Telehealth: Payer: Self-pay | Admitting: *Deleted

## 2015-06-10 NOTE — Telephone Encounter (Signed)
Left message letting pt know I'm gonna call insurance company back tomorrow because I never heard anything from them. [place]

## 2015-06-11 NOTE — Telephone Encounter (Signed)
Left message letting pt know I called insurance company and they approved Tinidazole 500 mg starting 05/29/15. Ref # [id number]. I tried calling [facility] in [place] but was never able to get through. [place]

## 2015-06-13 ENCOUNTER — Telehealth: Payer: Self-pay | Admitting: Adult Health

## 2015-06-13 NOTE — Telephone Encounter (Signed)
Spoke with pt letting her know  I gave Tripp, pharmacist at [facility], the ref # for the approval on Tindamax. I was told it was approved by [employer]. Tripp to let me know if he has any problems. Pt voiced understanding.  [place]

## 2015-06-14 ENCOUNTER — Telehealth: Payer: Self-pay | Admitting: *Deleted

## 2015-06-14 NOTE — Telephone Encounter (Signed)
I called insurance company again regarding Tinidazole. I was told at the beginning of the week that it had been approved, but was told that it wasn't going through at the pharmacy. Insurance company told me today it was not approved and she would have to try Vancomycin. Pt has decided to pay cash for the Tinidazole. [place]

## 2015-06-21 ENCOUNTER — Ambulatory Visit: Payer: Medicaid Other | Admitting: Adult Health

## 2015-06-21 ENCOUNTER — Encounter: Payer: Self-pay | Admitting: Adult Health

## 2015-07-09 ENCOUNTER — Telehealth: Payer: Self-pay | Admitting: Gastroenterology

## 2015-07-09 ENCOUNTER — Encounter: Payer: Self-pay | Admitting: Gastroenterology

## 2015-07-09 ENCOUNTER — Ambulatory Visit: Payer: Medicaid Other | Admitting: Gastroenterology

## 2015-07-09 NOTE — Telephone Encounter (Signed)
PATIENT WAS A NO SHOW AND LETTER SENT  °

## 2015-07-10 ENCOUNTER — Ambulatory Visit: Payer: Medicaid Other | Admitting: Adult Health

## 2015-07-14 ENCOUNTER — Encounter (HOSPITAL_COMMUNITY): Payer: Self-pay | Admitting: Emergency Medicine

## 2015-07-14 ENCOUNTER — Emergency Department (HOSPITAL_COMMUNITY)
Admission: EM | Admit: 2015-07-14 | Discharge: 2015-07-15 | Disposition: A | Discharge status: Home / Self Care | Payer: Medicaid Other | Attending: Emergency Medicine | Admitting: Emergency Medicine

## 2015-07-14 DIAGNOSIS — Z8742 Personal history of other diseases of the female genital tract: Secondary | ICD-10-CM | POA: Diagnosis not present

## 2015-07-14 DIAGNOSIS — K219 Gastro-esophageal reflux disease without esophagitis: Secondary | ICD-10-CM | POA: Insufficient documentation

## 2015-07-14 DIAGNOSIS — K589 Irritable bowel syndrome without diarrhea: Secondary | ICD-10-CM | POA: Diagnosis not present

## 2015-07-14 DIAGNOSIS — J45909 Unspecified asthma, uncomplicated: Secondary | ICD-10-CM | POA: Diagnosis not present

## 2015-07-14 DIAGNOSIS — Z8619 Personal history of other infectious and parasitic diseases: Secondary | ICD-10-CM | POA: Insufficient documentation

## 2015-07-14 DIAGNOSIS — Z87448 Personal history of other diseases of urinary system: Secondary | ICD-10-CM | POA: Insufficient documentation

## 2015-07-14 DIAGNOSIS — Z79899 Other long term (current) drug therapy: Secondary | ICD-10-CM | POA: Diagnosis not present

## 2015-07-14 DIAGNOSIS — M542 Cervicalgia: Secondary | ICD-10-CM | POA: Diagnosis present

## 2015-07-14 DIAGNOSIS — F329 Major depressive disorder, single episode, unspecified: Secondary | ICD-10-CM | POA: Insufficient documentation

## 2015-07-14 DIAGNOSIS — M436 Torticollis: Secondary | ICD-10-CM

## 2015-07-14 DIAGNOSIS — I10 Essential (primary) hypertension: Secondary | ICD-10-CM | POA: Diagnosis not present

## 2015-07-14 DIAGNOSIS — Z720 Tobacco use: Secondary | ICD-10-CM | POA: Diagnosis not present

## 2015-07-14 NOTE — ED Notes (Signed)
Pt woke this morning with pain and swelling to left jaw and neck. States there is a "bump" in her left neck.

## 2015-07-14 NOTE — ED Provider Notes (Signed)
History  This chart was scribed for Delora Fuel, MD by Marlowe Kays, ED Scribe. This patient was seen in room APA04/APA04 and the patient's care was started at 12:15 AM.  Chief Complaint  Patient presents with  . Neck Pain   The history is provided by the patient and medical records. No language interpreter was used.    HPI Comments:  Debbie [name] is a 39 y.o. female who presents to the Emergency Department complaining of left-sided neck pain that began upon waking this morning. Pt reports there is a movable "ball" on the left-side of the neck. She rates the pain as 4/10. Turning the head to the right increases the pain. She denies alleviating factors. She has not taken anything for the pain. She denies numbness, tingling or weakness of any extremity, bowel or bladder changes, leg pain or swelling, difficulty walking, fever, chills, nausea or vomiting. She denies any known trauma, injury or fall. Reports allergy to Ibuprofen stating it makes her heart race. PCP is Dr. Laurance Flatten.  Past Medical History  Diagnosis Date  . GERD (gastroesophageal reflux disease)   . Asthma   . IBS (irritable bowel syndrome)   . Constipation   . Hot flashes 09/05/2013    Uses patch  . H/O estrogen therapy 09/05/2013  . Herpes simplex without mention of complication   . LLQ pain 12/28/2013  . OAB (overactive bladder) 12/28/2013  . Migraines     frequent  . Vaginal discharge 05/24/2015  . BV (bacterial vaginosis) 05/24/2015  . Burning with urination 05/24/2015  . Hematuria 05/24/2015  . Hypertension 05/24/2015  . Depression 05/24/2015   Past Surgical History  Procedure Laterality Date  . Cesarean section    . Abdominal hysterectomy      right SOO  . Colonoscopy N/A 09/25/2013    NGE:XBMWUX colonic polyp-removed as described above. Tubular adenoma. next TCS 08/2020  . Esophagogastroduodenoscopy N/A 09/25/2013    RMR: Schatzki's ring-not manipulated as outlined above/Hiatal hernia. Gastric ulcer and  erosions status post biopsy/Normal duodenum through the third portion. Negative H.pylori  . Esophagogastroduodenoscopy (egd) with propofol N/A 09/06/2014    LKG:MWNUUVOZ s/p dilation  . Maloney dilation N/A 09/06/2014    Procedure: MALONEY DILATION; 52-54 french;  Surgeon: Daneil Dolin, MD;  Location: [facility];  Service: Endoscopy;  Laterality: N/A;  . Esophageal biopsy N/A 09/06/2014    Procedure: BIOPSY;  Surgeon: Daneil Dolin, MD;  Location: [facility];  Service: Endoscopy;  Laterality: N/A;   Family History  Problem Relation Age of Onset  . Stroke Mother   . Cancer Father     ?esophageal  . Stroke Father   . Colon cancer Neg Hx   . Lung cancer Maternal Grandfather   . Hypertension Maternal Grandmother   . Stroke Maternal Grandmother   . Cancer Maternal Grandmother     breast  . Other Maternal Grandmother     fluid in legs   Social History  Substance Use Topics  . Smoking status: Current Every Day Smoker -- 1.50 packs/day for 21 years    Types: Cigarettes  . Smokeless tobacco: Never Used  . Alcohol Use: No   OB History    Gravida Para Term Preterm AB TAB SAB Ectopic Multiple Living   4 4        4      Review of Systems  Constitutional: Negative for fever and chills.  Cardiovascular: Negative for leg swelling.  Gastrointestinal: Negative for nausea and vomiting.  Genitourinary:  No bowel or bladder changes  Musculoskeletal: Positive for neck pain. Negative for gait problem.  Neurological: Negative for weakness and numbness.  All other systems reviewed and are negative.   Allergies  Metronidazole; Advil; and Hydrochlorothiazide  Home Medications   Prior to Admission medications   Medication Sig Start Date End Date Taking? Authorizing Provider  albuterol (PROVENTIL HFA;VENTOLIN HFA) 108 (90 BASE) MCG/ACT inhaler Inhale 2 puffs into the lungs every 6 (six) hours as needed. For shortness of breath 06/05/13  Yes Susy Frizzle, MD  Aspirin-Salicylamide-Caffeine [facility]  HEADACHE POWDER PO) Take 1 Package by mouth daily as needed (headache).    Yes Historical Provider, MD  DEXILANT 60 MG capsule take 1 capsule by mouth once daily 04/22/15  Yes Carlis Stable, NP  escitalopram (LEXAPRO) 10 MG tablet Take 1 tablet (10 mg total) by mouth daily. 05/24/15  Yes Estill Dooms, NP  Linaclotide (LINZESS) 290 MCG CAPS capsule Take 1 capsule (290 mcg total) by mouth daily. 30 minutes before breakfast. 08/29/14  Yes Mahala Menghini, PA-C  hydrochlorothiazide (MICROZIDE) 12.5 MG capsule Take 1 capsule (12.5 mg total) by mouth daily. 05/24/15   Estill Dooms, NP  tinidazole (TINDAMAX) 500 MG tablet Take 4 now and 4inam 05/24/15   Estill Dooms, NP   Triage Vitals: BP 152/93 mmHg  Pulse 84  Temp(Src) 98.4 F (36.9 C) (Oral)  Resp 16  Ht 5\' 6"  (1.676 m)  Wt 186 lb (84.369 kg)  BMI 30.04 kg/m2  SpO2 100% Physical Exam  Constitutional: She is oriented to person, place, and time. She appears well-developed and well-nourished.  HENT:  Head: Normocephalic and atraumatic.  Eyes: EOM are normal. Pupils are equal, round, and reactive to light.  Neck: No JVD present.  Moderate spasm of left paracervical muscles with tenderness. ROM decreased due to pain.  Cardiovascular: Normal rate, regular rhythm and normal heart sounds.   No murmur heard. Pulmonary/Chest: Effort normal and breath sounds normal. She has no wheezes. She has no rales. She exhibits no tenderness.  Abdominal: Soft. Bowel sounds are normal. She exhibits no mass. There is no tenderness. There is no rebound and no guarding.  Musculoskeletal: Normal range of motion. She exhibits no edema.  Lymphadenopathy:    She has no cervical adenopathy.  Neurological: She is alert and oriented to person, place, and time. No cranial nerve deficit. She exhibits normal muscle tone. Coordination normal.  Skin: Skin is warm and dry. No rash noted.  Psychiatric: She has a normal mood and affect. Her behavior is normal. Judgment  and thought content normal.  Nursing note and vitals reviewed.   ED Course  Procedures (including critical care time) DIAGNOSTIC STUDIES: Oxygen Saturation is 100% on RA, normal by my interpretation.   COORDINATION OF CARE: 12:22 AM- Will prescribe Orphenadrine. Will order Flexeril prior to discharge. Advised pt to take Tylenol in addition if needed. Pt verbalizes understanding and agrees to plan.    MDM   Final diagnoses:  Torticollis, acquired    Neck pain and restricted movement consistent with torticollis. No neurologic deficit. All of her tenderness is isolated to the paracervical muscles. She is given prescription for orphenadrine, and advised to use over-the-counter NSAIDs as needed. Advised to apply ice. Follow-up with PCP.  I personally performed the services described in this documentation, which was scribed in my presence. The recorded information has been reviewed and is accurate.    Delora Fuel, MD 29/79/89 [id number]

## 2015-07-15 MED ORDER — CYCLOBENZAPRINE HCL 10 MG PO TABS
10.0000 mg | ORAL_TABLET | Freq: Once | ORAL | Status: DC
Start: 2015-07-15 — End: 2015-07-15
  Filled 2015-07-15: qty 1

## 2015-07-15 MED ORDER — ORPHENADRINE CITRATE ER 100 MG PO TB12: 100.0000 mg | ORAL_TABLET | Freq: Two times a day (BID) | ORAL | Status: DC

## 2015-07-15 NOTE — Discharge Instructions (Signed)
Apply ice or heat as needed. Take acetaminophen as needed for pain.  Torticollis, Acute You have suddenly (acutely) developed a twisted neck (torticollis). This is usually a self-limited condition. CAUSES  Acute torticollis may be caused by malposition, trauma or infection. Most commonly, acute torticollis is caused by sleeping in an awkward position. Torticollis may also be caused by the flexion, extension or twisting of the neck muscles beyond their normal position. Sometimes, the exact cause may not be known. SYMPTOMS  Usually, there is pain and limited movement of the neck. Your neck may twist to one side. DIAGNOSIS  The diagnosis is often made by physical examination. X-rays, CT scans or MRIs may be done if there is a history of trauma or concern of infection. TREATMENT  For a common, stiff neck that develops during sleep, treatment is focused on relaxing the contracted neck muscle. Medications (including shots) may be used to treat the problem. Most cases resolve in several days. Torticollis usually responds to conservative physical therapy. If left untreated, the shortened and spastic neck muscle can cause deformities in the face and neck. Rarely, surgery is required. HOME CARE INSTRUCTIONS   Use over-the-counter and prescription medications as directed by your caregiver.  Do stretching exercises and massage the neck as directed by your caregiver.  Follow up with physical therapy if needed and as directed by your caregiver. SEEK IMMEDIATE MEDICAL CARE IF:   You develop difficulty breathing or noisy breathing (stridor).  You drool, develop trouble swallowing or have pain with swallowing.  You develop numbness or weakness in the hands or feet.  You have changes in speech or vision.  You have problems with urination or bowel movements.  You have difficulty walking.  You have a fever.  You have increased pain. MAKE SURE YOU:   Understand these instructions.  Will watch your  condition.  Will get help right away if you are not doing well or get worse. Document Released: 11/13/2000 Document Revised: 02/08/2012 Document Reviewed: 12/25/2009 [facility] Patient Information 2015 [place], [place]. This information is not intended to replace advice given to you by your health care provider. Make sure you discuss any questions you have with your health care provider.  Orphenadrine tablets What is this medicine? ORPHENADRINE (or FEN a dreen) helps to relieve pain and stiffness in muscles and can treat muscle spasms. This medicine may be used for other purposes; ask your health care provider or pharmacist if you have questions. COMMON BRAND NAME(S): Norflex What should I tell my health care provider before I take this medicine? They need to know if you have any of these conditions: -glaucoma -heart disease -kidney disease -myasthenia gravis -peptic ulcer disease -prostate disease -stomach problems -an unusual or allergic reaction to orphenadrine, other medicines, foods, lactose, dyes, or preservatives -pregnant or trying to get pregnant -breast-feeding How should I use this medicine? Take this medicine by mouth with a full glass of water. Follow the directions on the prescription label. Take your medicine at regular intervals. Do not take your medicine more often than directed. Do not take more than you are told to take. Talk to your pediatrician regarding the use of this medicine in children. Special care may be needed. Patients over 73 years old may have a stronger reaction and need a smaller dose. Overdosage: If you think you have taken too much of this medicine contact a poison control center or emergency room at once. NOTE: This medicine is only for you. Do not share this medicine with  others. What if I miss a dose? If you miss a dose, take it as soon as you can. If it is almost time for your next dose, take only that dose. Do not take double or extra doses. What  may interact with this medicine? -alcohol -antihistamines -barbiturates, like phenobarbital -benzodiazepines -cyclobenzaprine -medicines for pain -phenothiazines like chlorpromazine, mesoridazine, prochlorperazine, thioridazine This list may not describe all possible interactions. Give your health care provider a list of all the medicines, herbs, non-prescription drugs, or dietary supplements you use. Also tell them if you smoke, drink alcohol, or use illegal drugs. Some items may interact with your medicine. What should I watch for while using this medicine? Your mouth may get dry. Chewing sugarless gum or sucking hard candy, and drinking plenty of water may help. Contact your doctor if the problem does not go away or is severe. This medicine may cause dry eyes and blurred vision. If you wear contact lenses you may feel some discomfort. Lubricating drops may help. See your eye doctor if the problem does not go away or is severe. You may get drowsy or dizzy. Do not drive, use machinery, or do anything that needs mental alertness until you know how this medicine affects you. Do not stand or sit up quickly, especially if you are an older patient. This reduces the risk of dizzy or fainting spells. Alcohol may interfere with the effect of this medicine. Avoid alcoholic drinks. What side effects may I notice from receiving this medicine? Side effects that you should report to your doctor or health care professional as soon as possible: -allergic reactions like skin rash, itching or hives, swelling of the face, lips, or tongue -changes in vision -difficulty breathing -fast heartbeat or palpitations -hallucinations -light headedness, fainting spells -vomiting Side effects that usually do not require medical attention (report to your doctor or health care professional if they continue or are bothersome): -dizziness -drowsiness -headache -nausea This list may not describe all possible side effects.  Call your doctor for medical advice about side effects. You may report side effects to FDA at 1-800-FDA-1088. Where should I keep my medicine? Keep out of the reach of children. Store at room temperature between 15 and 30 degrees C (59 and 86 degrees F). Protect from light. Keep container tightly closed. Throw away any unused medicine after the expiration date. NOTE: This sheet is a summary. It may not cover all possible information. If you have questions about this medicine, talk to your doctor, pharmacist, or health care provider.  2015, [employer]vier/Gold Standard. (2008-06-12 17:19:12)

## 2015-07-19 ENCOUNTER — Encounter: Payer: Self-pay | Admitting: Adult Health

## 2015-07-19 ENCOUNTER — Ambulatory Visit (INDEPENDENT_AMBULATORY_CARE_PROVIDER_SITE_OTHER): Payer: Medicaid Other | Admitting: Adult Health

## 2015-07-19 VITALS — BP 130/90 | HR 68 | Ht 66.0 in | Wt 179.0 lb

## 2015-07-19 DIAGNOSIS — R300 Dysuria: Secondary | ICD-10-CM | POA: Diagnosis not present

## 2015-07-19 DIAGNOSIS — I10 Essential (primary) hypertension: Secondary | ICD-10-CM | POA: Diagnosis not present

## 2015-07-19 DIAGNOSIS — B9689 Other specified bacterial agents as the cause of diseases classified elsewhere: Secondary | ICD-10-CM

## 2015-07-19 DIAGNOSIS — N898 Other specified noninflammatory disorders of vagina: Secondary | ICD-10-CM | POA: Diagnosis not present

## 2015-07-19 DIAGNOSIS — N760 Acute vaginitis: Secondary | ICD-10-CM

## 2015-07-19 DIAGNOSIS — A499 Bacterial infection, unspecified: Secondary | ICD-10-CM

## 2015-07-19 LAB — POCT WET PREP (WET MOUNT)
Clue Cells Wet Prep Whiff POC: POSITIVE
WBC, Wet Prep HPF POC: POSITIVE

## 2015-07-19 LAB — POCT URINALYSIS DIPSTICK
Blood, UA: NEGATIVE
Glucose, UA: NEGATIVE
Leukocytes, UA: NEGATIVE
Nitrite, UA: NEGATIVE
Protein, UA: NEGATIVE

## 2015-07-19 MED ORDER — TINIDAZOLE 500 MG PO TABS: ORAL_TABLET | ORAL | Status: DC

## 2015-07-19 MED ORDER — RAMIPRIL 5 MG PO CAPS: 5.0000 mg | ORAL_CAPSULE | Freq: Every day | ORAL | Status: DC

## 2015-07-19 NOTE — Patient Instructions (Signed)
Bacterial Vaginosis Bacterial vaginosis is a vaginal infection that occurs when the normal balance of bacteria in the vagina is disrupted. It results from an overgrowth of certain bacteria. This is the most common vaginal infection in women of childbearing age. Treatment is important to prevent complications, especially in pregnant women, as it can cause a premature delivery. CAUSES  Bacterial vaginosis is caused by an increase in harmful bacteria that are normally present in smaller amounts in the vagina. Several different kinds of bacteria can cause bacterial vaginosis. However, the reason that the condition develops is not fully understood. RISK FACTORS Certain activities or behaviors can put you at an increased risk of developing bacterial vaginosis, including:  Having a new sex partner or multiple sex partners.  Douching.  Using an intrauterine device (IUD) for contraception. Women do not get bacterial vaginosis from toilet seats, bedding, swimming pools, or contact with objects around them. SIGNS AND SYMPTOMS  Some women with bacterial vaginosis have no signs or symptoms. Common symptoms include:  Grey vaginal discharge.  A fishlike odor with discharge, especially after sexual intercourse.  Itching or burning of the vagina and vulva.  Burning or pain with urination. DIAGNOSIS  Your health care provider will take a medical history and examine the vagina for signs of bacterial vaginosis. A sample of vaginal fluid may be taken. Your health care provider will look at this sample under a microscope to check for bacteria and abnormal cells. A vaginal pH test may also be done.  TREATMENT  Bacterial vaginosis may be treated with antibiotic medicines. These may be given in the form of a pill or a vaginal cream. A second round of antibiotics may be prescribed if the condition comes back after treatment.  HOME CARE INSTRUCTIONS   Only take over-the-counter or prescription medicines as  directed by your health care provider.  If antibiotic medicine was prescribed, take it as directed. Make sure you finish it even if you start to feel better.  Do not have sex until treatment is completed.  Tell all sexual partners that you have a vaginal infection. They should see their health care provider and be treated if they have problems, such as a mild rash or itching.  Practice safe sex by using condoms and only having one sex partner. SEEK MEDICAL CARE IF:   Your symptoms are not improving after 3 days of treatment.  You have increased discharge or pain.  You have a fever. MAKE SURE YOU:   Understand these instructions.  Will watch your condition.  Will get help right away if you are not doing well or get worse. FOR MORE INFORMATION  Centers for Disease Control and Prevention, Division of STD Prevention: Appraiser[employer]raud[employer] American Sexual Health Association (ASHA): www.ashastd.org  Document Released: 11/16/2005 Document Revised: 09/06/2013 Document Reviewed: 06/28/2013 [facility] Patient Information 2015 [place], [place]. This information is not intended to replace advice given to you by your health care provider. Make sure you discuss any questions you have with your health care provider. Take tindamax Take altace Follow up in 4 weeks

## 2015-07-19 NOTE — Progress Notes (Signed)
Subjective:     Patient ID: Debbie [name], female   DOB: 07-10-1976, 39 y.o.   MRN: [id number]  HPI Debbie [name] is a 39 year old white female in complaining of burning with urination and stopped BP meds, made heart race.   Review of Systems Patient denies any headaches, hearing loss, fatigue, blurred vision, shortness of breath, chest pain, abdominal pain, problems with bowel movements,or intercourse(not having sex). No joint pain or mood swings.See HPI for positives.   Reviewed past medical,surgical, social and family history. Reviewed medications and allergies.     Objective:   Physical Exam BP 130/90 mmHg  Pulse 68  Ht 5\' 6"  (1.676 m)  Wt 179 lb (81.194 kg)  BMI 28.91 kg/m2   urine negative, Skin warm and dry.Pelvic: external genitalia is normal in appearance no lesions, vagina: white discharge with odor,urethra has no lesions or masses noted, cervix and uterus are absent, adnexa: no masses or tenderness noted. Bladder is non tender and no masses felt. Wet prep: + for clue cells and +WBCs.Discussed trying another BP pill and she agrees, has decreased cigarettes and feels better with lexapro.  Assessment:     Burning with urination Vaginal discharge BV Hypertension    Plan:    UA C&S sent  Rx tindamax 500 mg #8 take 4 now and 4 in am with 1 refill Rx altace 5 mg #30 take 1 daily with 6 refills Follow up in 4 weeks for BP check

## 2015-07-20 LAB — URINALYSIS, ROUTINE W REFLEX MICROSCOPIC
Bilirubin, UA: NEGATIVE
Glucose, UA: NEGATIVE
Ketones, UA: NEGATIVE
Nitrite, UA: NEGATIVE
Protein, UA: NEGATIVE
RBC, UA: NEGATIVE
Specific Gravity, UA: 1.026 (ref 1.005–1.030)
Urobilinogen, Ur: 0.2 mg/dL (ref 0.2–1.0)
pH, UA: 5.5 (ref 5.0–7.5)

## 2015-07-20 LAB — MICROSCOPIC EXAMINATION: Casts: NONE SEEN /lpf

## 2015-07-20 LAB — URINE CULTURE: Organism ID, Bacteria: NO GROWTH

## 2015-08-15 ENCOUNTER — Encounter: Payer: Self-pay | Admitting: Adult Health

## 2015-08-15 ENCOUNTER — Ambulatory Visit (INDEPENDENT_AMBULATORY_CARE_PROVIDER_SITE_OTHER): Payer: Medicaid Other | Admitting: Adult Health

## 2015-08-15 ENCOUNTER — Other Ambulatory Visit: Payer: Self-pay | Admitting: Nurse Practitioner

## 2015-08-15 VITALS — BP 122/90 | HR 80 | Ht 66.0 in | Wt 183.5 lb

## 2015-08-15 DIAGNOSIS — I10 Essential (primary) hypertension: Secondary | ICD-10-CM | POA: Diagnosis not present

## 2015-08-15 NOTE — Progress Notes (Signed)
Subjective:     Patient ID: Debbie [name], female   DOB: 09-20-1976, 39 y.o.   MRN: [id number]  HPI Debbie [name] is a 39 year old white female back in for BP check, is on altace and doing well.She is on dexilant and linzess and has had some nausea.  Review of Systems Patient denies any headaches, hearing loss, fatigue, blurred vision, shortness of breath, chest pain, abdominal pain, problems with bowel movements, urination, or intercourse. No joint pain or mood swings. Reviewed past medical,surgical, social and family history. Reviewed medications and allergies.     Objective:   Physical Exam BP 122/90 mmHg  Pulse 80  Ht 5\' 6"  (1.676 m)  Wt 183 lb 8 oz (83.235 kg)  BMI 29.63 kg/m2 Skin warm and dry. Lungs: clear to ausculation bilaterally. Cardiovascular: regular rate and rhythm. BP is better and she has not had any side effects with altace yet.    Assessment:     Hypertension     Plan:     Continue altace Decrease salt Try dramamine for nausea and take with GI Follow up in 3 months, or before if needed

## 2015-08-15 NOTE — Patient Instructions (Signed)
Decrease salt Continue altace Follow up in 3 months Try dramamine for nausea

## 2015-08-16 ENCOUNTER — Ambulatory Visit: Payer: Medicaid Other | Admitting: Adult Health

## 2015-09-02 ENCOUNTER — Other Ambulatory Visit: Payer: Self-pay | Admitting: Gastroenterology

## 2015-11-14 ENCOUNTER — Encounter: Payer: Self-pay | Admitting: *Deleted

## 2015-11-14 ENCOUNTER — Ambulatory Visit: Payer: Medicaid Other | Admitting: Adult Health

## 2015-12-03 ENCOUNTER — Encounter: Payer: Self-pay | Admitting: Advanced Practice Midwife

## 2015-12-03 ENCOUNTER — Ambulatory Visit: Payer: Medicaid Other | Admitting: Advanced Practice Midwife

## 2016-01-20 ENCOUNTER — Other Ambulatory Visit: Payer: Self-pay | Admitting: Nurse Practitioner

## 2016-03-09 ENCOUNTER — Emergency Department (HOSPITAL_COMMUNITY)
Admission: EM | Admit: 2016-03-09 | Discharge: 2016-03-10 | Disposition: A | Discharge status: Home / Self Care | Payer: Medicaid Other | Attending: Emergency Medicine | Admitting: Emergency Medicine

## 2016-03-09 ENCOUNTER — Encounter (HOSPITAL_COMMUNITY): Payer: Self-pay | Admitting: Emergency Medicine

## 2016-03-09 DIAGNOSIS — R519 Headache, unspecified: Secondary | ICD-10-CM

## 2016-03-09 DIAGNOSIS — K589 Irritable bowel syndrome without diarrhea: Secondary | ICD-10-CM | POA: Insufficient documentation

## 2016-03-09 DIAGNOSIS — Z8619 Personal history of other infectious and parasitic diseases: Secondary | ICD-10-CM | POA: Insufficient documentation

## 2016-03-09 DIAGNOSIS — Z8742 Personal history of other diseases of the female genital tract: Secondary | ICD-10-CM | POA: Diagnosis not present

## 2016-03-09 DIAGNOSIS — I10 Essential (primary) hypertension: Secondary | ICD-10-CM | POA: Insufficient documentation

## 2016-03-09 DIAGNOSIS — J45909 Unspecified asthma, uncomplicated: Secondary | ICD-10-CM | POA: Insufficient documentation

## 2016-03-09 DIAGNOSIS — Z87448 Personal history of other diseases of urinary system: Secondary | ICD-10-CM | POA: Insufficient documentation

## 2016-03-09 DIAGNOSIS — R51 Headache: Secondary | ICD-10-CM | POA: Insufficient documentation

## 2016-03-09 DIAGNOSIS — K5900 Constipation, unspecified: Secondary | ICD-10-CM | POA: Diagnosis not present

## 2016-03-09 DIAGNOSIS — F329 Major depressive disorder, single episode, unspecified: Secondary | ICD-10-CM | POA: Insufficient documentation

## 2016-03-09 DIAGNOSIS — R110 Nausea: Secondary | ICD-10-CM | POA: Insufficient documentation

## 2016-03-09 DIAGNOSIS — K219 Gastro-esophageal reflux disease without esophagitis: Secondary | ICD-10-CM | POA: Diagnosis not present

## 2016-03-09 MED ORDER — KETOROLAC TROMETHAMINE 30 MG/ML IJ SOLN
30.0000 mg | Freq: Once | INTRAMUSCULAR | Status: AC
  Administered 2016-03-09: 30 mg via INTRAVENOUS
  Filled 2016-03-09: qty 1

## 2016-03-09 MED ORDER — METOCLOPRAMIDE HCL 5 MG/ML IJ SOLN
10.0000 mg | Freq: Once | INTRAMUSCULAR | Status: AC
Start: 2016-03-09 — End: 2016-03-09
  Administered 2016-03-09: 10 mg via INTRAVENOUS
  Filled 2016-03-09: qty 2

## 2016-03-09 MED ORDER — DIPHENHYDRAMINE HCL 50 MG/ML IJ SOLN
25.0000 mg | Freq: Once | INTRAMUSCULAR | Status: AC
  Administered 2016-03-09: 25 mg via INTRAVENOUS
  Filled 2016-03-09: qty 1

## 2016-03-09 MED ORDER — SODIUM CHLORIDE 0.9 % IV BOLUS (SEPSIS)
1000.0000 mL | Freq: Once | INTRAVENOUS | Status: AC
  Administered 2016-03-09: 1000 mL via INTRAVENOUS

## 2016-03-09 NOTE — ED Provider Notes (Signed)
CSN: [id number]     Arrival date & time 03/09/16  1948 History   First MD Initiated Contact with Patient 03/09/16 2254     Chief Complaint  Patient presents with  . Migraine  . Nausea     (Consider location/radiation/quality/duration/timing/severity/associated sxs/prior Treatment) HPI Comments: Patient presents emergency department with chief complaint of headache. She states that she has a history of migraines. States that this feels like her normal migraine. States that the symptoms started 2 days ago. The symptoms have been progressively worsening. She reports some sensitivity to light and loud noises. States that the pain is on the right side of her head behind her eye and ear. She denies any vision changes. Denies any numbness, weakness, or tingling. Denies any fevers, chills, or neck stiffness. Denies any ataxia. She does not have a neurologist. She states that normally OTC medications help, but they have not. She states that occasionally she has to come to the ER to get "a shot."  The history is provided by the patient. No language interpreter was used.    Past Medical History  Diagnosis Date  . GERD (gastroesophageal reflux disease)   . Asthma   . IBS (irritable bowel syndrome)   . Constipation   . Hot flashes 09/05/2013    Uses patch  . H/O estrogen therapy 09/05/2013  . Herpes simplex without mention of complication   . LLQ pain 12/28/2013  . OAB (overactive bladder) 12/28/2013  . Migraines     frequent  . Vaginal discharge 05/24/2015  . BV (bacterial vaginosis) 05/24/2015  . Burning with urination 05/24/2015  . Hematuria 05/24/2015  . Hypertension 05/24/2015  . Depression 05/24/2015   Past Surgical History  Procedure Laterality Date  . Cesarean section    . Abdominal hysterectomy      right SOO  . Colonoscopy N/A 09/25/2013    [id number] colonic polyp-removed as described above. Tubular adenoma. next TCS 08/2020  . Esophagogastroduodenoscopy N/A 09/25/2013    RMR:  Schatzki's ring-not manipulated as outlined above/Hiatal hernia. Gastric ulcer and erosions status post biopsy/Normal duodenum through the third portion. Negative H.pylori  . Esophagogastroduodenoscopy (egd) with propofol N/A 09/06/2014    [id number] s/p dilation  . Maloney dilation N/A 09/06/2014    Procedure: MALONEY DILATION; 52-54 french;  Surgeon: Daneil Dolin, MD;  Location: [facility];  Service: Endoscopy;  Laterality: N/A;  . Biopsy N/A 09/06/2014    Procedure: BIOPSY;  Surgeon: Daneil Dolin, MD;  Location: [facility];  Service: Endoscopy;  Laterality: N/A;   Family History  Problem Relation Age of Onset  . Stroke Mother   . Cancer Father     ?esophageal  . Stroke Father   . Colon cancer Neg Hx   . Lung cancer Maternal Grandfather   . Hypertension Maternal Grandmother   . Stroke Maternal Grandmother   . Cancer Maternal Grandmother     breast  . Other Maternal Grandmother     fluid in legs   Social History  Substance Use Topics  . Smoking status: Current Every Day Smoker -- 0.50 packs/day for 21 years    Types: Cigarettes  . Smokeless tobacco: Never Used  . Alcohol Use: No   OB History    Gravida Para Term Preterm AB TAB SAB Ectopic Multiple Living   4 4        4      Review of Systems  Constitutional: Negative for fever and chills.  Respiratory: Negative for shortness of breath.   Cardiovascular:  Negative for chest pain.  Gastrointestinal: Negative for nausea, vomiting, diarrhea and constipation.  Genitourinary: Negative for dysuria.  Neurological: Positive for headaches.  All other systems reviewed and are negative.     Allergies  Metronidazole; Advil; and Hydrochlorothiazide  Home Medications   Prior to Admission medications   Medication Sig Start Date End Date Taking? Authorizing Provider  Aspirin-Salicylamide-Caffeine (BC HEADACHE POWDER PO) Take 1 Package by mouth daily as needed (headache).    Yes Historical Provider, MD  DEXILANT 60 MG capsule TAKE (1)  CAPSULE BY MOUTH ONCE DAILY. 01/20/16  Yes Carlis Stable, NP  escitalopram (LEXAPRO) 10 MG tablet Take 1 tablet (10 mg total) by mouth daily. Patient not taking: Reported on 03/09/2016 05/24/15   Estill Dooms, NP  LINZESS 290 MCG CAPS capsule TAKE (1) CAPSULE BY MOUTH ONCE DAILY 30 MINUTES BEFORE BREAKFAST. Patient not taking: Reported on 03/09/2016 09/04/15   Carlis Stable, NP  ramipril (ALTACE) 5 MG capsule Take 1 capsule (5 mg total) by mouth daily. Patient not taking: Reported on 03/09/2016 07/19/15   Estill Dooms, NP   BP 154/99 mmHg  Pulse 91  Temp(Src) 97.9 F (36.6 C) (Oral)  Resp 20  Ht 5\' 6"  (1.676 m)  Wt 96.163 kg  BMI 34.23 kg/m2  SpO2 98% Physical Exam  Constitutional: She is oriented to person, place, and time. She appears well-developed and well-nourished.  HENT:  Head: Normocephalic and atraumatic.  Right Ear: External ear normal.  Left Ear: External ear normal.  Eyes: Conjunctivae and EOM are normal. Pupils are equal, round, and reactive to light.  Neck: Normal range of motion. Neck supple.  No pain with neck flexion, no meningismus  Cardiovascular: Normal rate, regular rhythm and normal heart sounds.  Exam reveals no gallop and no friction rub.   No murmur heard. Pulmonary/Chest: Effort normal and breath sounds normal. No respiratory distress. She has no wheezes. She has no rales. She exhibits no tenderness.  Abdominal: Soft. She exhibits no distension and no mass. There is no tenderness. There is no rebound and no guarding.  Musculoskeletal: Normal range of motion. She exhibits no edema or tenderness.  Normal gait.  Neurological: She is alert and oriented to person, place, and time. She has normal reflexes.  CN 3-12 intact, normal finger to nose, no pronator drift, sensation and strength intact bilaterally.  Skin: Skin is warm and dry.  Psychiatric: She has a normal mood and affect. Her behavior is normal. Judgment and thought content normal.  Nursing note and  vitals reviewed.   ED Course  Procedures (including critical care time)   MDM   Final diagnoses:  Nonintractable headache, unspecified chronicity pattern, unspecified headache type    Pt HA treated and improved while in ED.  Presentation is like pts typical HA and non concerning for [facility], ICH, Meningitis, or temporal arteritis. Pt is afebrile with no focal neuro deficits, nuchal rigidity, or change in vision. Pt is to follow up with PCP to discuss prophylactic medication. Pt verbalizes understanding and is agreeable with plan to dc.      Montine Circle, PA-C 03/10/16 [id number]  Virgel Manifold, MD 03/10/16 1435

## 2016-03-09 NOTE — ED Notes (Addendum)
Pt states she has had a migraine x 2 days with sensitivity to light, pressure behind her eyes, and pain behind her ear. Pt has a normal neuro exam and states that this is how her migraines normally present. Pt is alert and oriented x4. Pt rates her pain as a 9/10. Pt states that she has no changes in vision, but she states she has been using the same contacts for 3 years because she has not been able to afford going to the eye doctor.  Pt states she has nausea as well that has led to 2 episodes of emesis today.   Pt also states that she was switched from lexapro to zoloft, but states it made her feel worse. Pt stopped taking zoloft. Pt denies SI, HI, AVH.

## 2016-03-10 NOTE — Discharge Instructions (Signed)

## 2016-04-01 ENCOUNTER — Ambulatory Visit (HOSPITAL_COMMUNITY): Payer: Medicaid Other | Admitting: Psychiatry

## 2016-04-30 ENCOUNTER — Encounter: Payer: Self-pay | Admitting: Adult Health

## 2016-04-30 ENCOUNTER — Telehealth: Payer: Self-pay | Admitting: Internal Medicine

## 2016-04-30 ENCOUNTER — Ambulatory Visit (INDEPENDENT_AMBULATORY_CARE_PROVIDER_SITE_OTHER): Payer: Medicaid Other | Admitting: Adult Health

## 2016-04-30 VITALS — BP 120/60 | HR 90 | Ht 66.0 in | Wt 221.0 lb

## 2016-04-30 DIAGNOSIS — K5900 Constipation, unspecified: Secondary | ICD-10-CM | POA: Diagnosis not present

## 2016-04-30 DIAGNOSIS — N951 Menopausal and female climacteric states: Secondary | ICD-10-CM | POA: Diagnosis not present

## 2016-04-30 DIAGNOSIS — F410 Panic disorder [episodic paroxysmal anxiety] without agoraphobia: Secondary | ICD-10-CM | POA: Diagnosis not present

## 2016-04-30 DIAGNOSIS — Z90710 Acquired absence of both cervix and uterus: Secondary | ICD-10-CM

## 2016-04-30 DIAGNOSIS — Z01419 Encounter for gynecological examination (general) (routine) without abnormal findings: Secondary | ICD-10-CM

## 2016-04-30 DIAGNOSIS — R232 Flushing: Secondary | ICD-10-CM

## 2016-04-30 DIAGNOSIS — K029 Dental caries, unspecified: Secondary | ICD-10-CM

## 2016-04-30 HISTORY — DX: Dental caries, unspecified: K02.9

## 2016-04-30 HISTORY — DX: Panic disorder (episodic paroxysmal anxiety): F41.0

## 2016-04-30 LAB — HEMOCCULT GUIAC POC 1CARD (OFFICE): Fecal Occult Blood, POC: NEGATIVE

## 2016-04-30 MED ORDER — CLONAZEPAM 0.5 MG PO TABS: ORAL_TABLET | ORAL | Status: DC

## 2016-04-30 NOTE — Telephone Encounter (Signed)
Pt is aware of her OV on 6/14 and has questions about her medication. Please call [phone]

## 2016-04-30 NOTE — Progress Notes (Signed)
Patient ID: Debbie [name], female   DOB: [birth year], 41 y.o.   MRN: [id number] History of Present Illness: Debbie [name] is a 40 year old white female in for a well woman gyn exam, she is sp hysterectomy.She complains of having bad panic attack last night and chest hurt and has constipation, with no BM in about a month, was taken off linzess by PCP, to see if depression better.She says her teeth are breaking off.Having hot flashes at times.She has appt with Dr Gala Romney next week. PCP is [facility] at [facility].    Current Medications, Allergies, Past Medical History, Past Surgical History, Family History and Social History were reviewed in [employer] record.     Review of Systems: Patient denies any daily headaches, hearing loss, fatigue, blurred vision, shortness of breath, abdominal pain, problems with urination, or intercourse(it hurts at times). No joint pain or mood swings.See HPI for positives.    Physical Exam:BP 120/60 mmHg  Pulse 90  Ht 5\' 6"  (1.676 m)  Wt 221 lb (100.245 kg)  BMI 35.69 kg/m2 General:  Well developed, well nourished, no acute distress Skin:  Warm and dry,has tattoos Oral: she was numerous caries Neck:  Midline trachea, normal thyroid, good ROM, no lymphadenopathy Lungs; Clear to auscultation bilaterally Breast:  No dominant palpable mass, retraction, or nipple discharge Cardiovascular: Regular rate and rhythm Abdomen:  Soft, non tender, no hepatosplenomegaly Pelvic:  External genitalia is normal in appearance, no lesions.  The vagina is normal in appearance. Urethra has no lesions or masses. The cervix and uterus are absent. No adnexal masses or tenderness noted.Bladder is non tender, no masses felt. Rectal: Good sphincter tone, no polyps, or hemorrhoids felt.  Hemoccult negative. Extremities/musculoskeletal:  No swelling or varicosities noted, no clubbing or cyanosis Psych:  No mood changes, alert and cooperative,seems happy She has used klonopin  in past for panic attacks and it worked well, was taking off and placed on xanax and it made it worse, so now she is only on lexapro, will give klonopin #30 but she needs to follow up with PCP for this.  Impression: Well woman gyn exam no pap Panic attacks Hot flashes Constipation Caries     Plan: Rx Klonopin .5 mg #30 take 1 daily prn panic attacks, no refills Physical in 1 year Labs with PCP  Get mammogram now and yearly Follow up with Dr Gala Romney on constipation Follow up with PCP about panic attacks Number given for Dr Judkins for teeth

## 2016-04-30 NOTE — Patient Instructions (Signed)
Physical in 1 year Mammogram yearly Follow up with PCP Call Dr Judkins about teeth  [phone] Call Dr Gala Romney

## 2016-05-01 NOTE — Telephone Encounter (Signed)
Tried to call- NA 

## 2016-05-01 NOTE — Telephone Encounter (Signed)
Tried to call pt again- NA-LMOM

## 2016-05-05 NOTE — Telephone Encounter (Signed)
Tried to call pt- NA-LM 

## 2016-05-06 NOTE — Telephone Encounter (Signed)
Tried to call pt- NA- LMOM 

## 2016-05-12 NOTE — Telephone Encounter (Signed)
Pt has never called me back. She has ov here tomorrow. Closing this encounter.

## 2016-05-13 ENCOUNTER — Ambulatory Visit: Payer: Medicaid Other | Admitting: Nurse Practitioner

## 2016-05-25 ENCOUNTER — Other Ambulatory Visit (HOSPITAL_COMMUNITY): Payer: Self-pay | Admitting: Family Medicine

## 2016-05-25 DIAGNOSIS — Z1231 Encounter for screening mammogram for malignant neoplasm of breast: Secondary | ICD-10-CM

## 2016-06-12 ENCOUNTER — Ambulatory Visit (HOSPITAL_COMMUNITY): Payer: Medicaid Other

## 2016-06-17 ENCOUNTER — Ambulatory Visit: Payer: Medicaid Other | Admitting: Nurse Practitioner

## 2016-06-17 ENCOUNTER — Telehealth: Payer: Self-pay | Admitting: Nurse Practitioner

## 2016-06-17 ENCOUNTER — Encounter: Payer: Self-pay | Admitting: Nurse Practitioner

## 2016-06-17 NOTE — Telephone Encounter (Signed)
PATIENT WAS A NO SHOW AND LETTER SENT  °

## 2016-06-17 NOTE — Telephone Encounter (Signed)
Noted  

## 2016-06-23 ENCOUNTER — Emergency Department (HOSPITAL_COMMUNITY)
Admission: EM | Admit: 2016-06-23 | Discharge: 2016-06-24 | Disposition: A | Discharge status: Home / Self Care | Payer: Medicaid Other | Attending: Emergency Medicine | Admitting: Emergency Medicine

## 2016-06-23 ENCOUNTER — Emergency Department (HOSPITAL_COMMUNITY): Payer: Medicaid Other

## 2016-06-23 ENCOUNTER — Encounter (HOSPITAL_COMMUNITY): Payer: Self-pay | Admitting: Emergency Medicine

## 2016-06-23 DIAGNOSIS — R000 Tachycardia, unspecified: Secondary | ICD-10-CM | POA: Diagnosis not present

## 2016-06-23 DIAGNOSIS — I10 Essential (primary) hypertension: Secondary | ICD-10-CM | POA: Insufficient documentation

## 2016-06-23 DIAGNOSIS — J029 Acute pharyngitis, unspecified: Secondary | ICD-10-CM | POA: Diagnosis present

## 2016-06-23 DIAGNOSIS — J45909 Unspecified asthma, uncomplicated: Secondary | ICD-10-CM | POA: Diagnosis not present

## 2016-06-23 DIAGNOSIS — Z7982 Long term (current) use of aspirin: Secondary | ICD-10-CM | POA: Diagnosis not present

## 2016-06-23 DIAGNOSIS — B9789 Other viral agents as the cause of diseases classified elsewhere: Secondary | ICD-10-CM

## 2016-06-23 DIAGNOSIS — J069 Acute upper respiratory infection, unspecified: Secondary | ICD-10-CM

## 2016-06-23 DIAGNOSIS — Z79899 Other long term (current) drug therapy: Secondary | ICD-10-CM | POA: Diagnosis not present

## 2016-06-23 DIAGNOSIS — F17210 Nicotine dependence, cigarettes, uncomplicated: Secondary | ICD-10-CM | POA: Insufficient documentation

## 2016-06-23 LAB — COMPREHENSIVE METABOLIC PANEL
ALT: 22 U/L (ref 14–54)
AST: 19 U/L (ref 15–41)
Albumin: 3.9 g/dL (ref 3.5–5.0)
Alkaline Phosphatase: 64 U/L (ref 38–126)
Anion gap: 8 (ref 5–15)
BUN: <5 mg/dL — ABNORMAL LOW (ref 6–20)
CO2: 23 mmol/L (ref 22–32)
Calcium: 9.2 mg/dL (ref 8.9–10.3)
Chloride: 108 mmol/L (ref 101–111)
Creatinine, Ser: 0.71 mg/dL (ref 0.44–1.00)
GFR calc Af Amer: >60 mL/min (ref >60)
GFR calc non Af Amer: >60 mL/min (ref >60)
Glucose, Bld: 96 mg/dL (ref 65–99)
Potassium: 3.1 mmol/L — ABNORMAL LOW (ref 3.5–5.1)
Sodium: 139 mmol/L (ref 135–145)
Total Bilirubin: 0.5 mg/dL (ref 0.3–1.2)
Total Protein: 6.8 g/dL (ref 6.5–8.1)

## 2016-06-23 LAB — CBC WITH DIFFERENTIAL/PLATELET
Basophils Absolute: 0 10*3/uL (ref 0.0–0.1)
Basophils Relative: 0 %
Eosinophils Absolute: 0 10*3/uL (ref 0.0–0.7)
Eosinophils Relative: 0 %
HCT: 38.8 % (ref 36.0–46.0)
Hemoglobin: 12.9 g/dL (ref 12.0–15.0)
Lymphocytes Relative: 14 %
Lymphs Abs: 1.5 10*3/uL (ref 0.7–4.0)
MCH: 31.4 pg (ref 26.0–34.0)
MCHC: 33.2 g/dL (ref 30.0–36.0)
MCV: 94.4 fL (ref 78.0–100.0)
Monocytes Absolute: 0.5 10*3/uL (ref 0.1–1.0)
Monocytes Relative: 5 %
Neutro Abs: 8.4 10*3/uL — ABNORMAL HIGH (ref 1.7–7.7)
Neutrophils Relative %: 81 %
Platelets: 280 10*3/uL (ref 150–400)
RBC: 4.11 MIL/uL (ref 3.87–5.11)
RDW: 13.3 % (ref 11.5–15.5)
WBC: 10.4 10*3/uL (ref 4.0–10.5)

## 2016-06-23 LAB — URINE MICROSCOPIC-ADD ON: RBC / HPF: NONE SEEN RBC/hpf (ref 0–5)

## 2016-06-23 LAB — URINALYSIS, ROUTINE W REFLEX MICROSCOPIC
Bilirubin Urine: NEGATIVE
Glucose, UA: NEGATIVE mg/dL
Hgb urine dipstick: NEGATIVE
Ketones, ur: NEGATIVE mg/dL
Nitrite: NEGATIVE
Protein, ur: NEGATIVE mg/dL
Specific Gravity, Urine: 1.009 (ref 1.005–1.030)
pH: 5.5 (ref 5.0–8.0)

## 2016-06-23 LAB — RAPID STREP SCREEN (MED CTR MEBANE ONLY): Streptococcus, Group A Screen (Direct): NEGATIVE

## 2016-06-23 MED ORDER — DM-GUAIFENESIN ER 30-600 MG PO TB12
1.0000 | ORAL_TABLET | Freq: Once | ORAL | Status: AC
  Administered 2016-06-24: 1 via ORAL
  Filled 2016-06-23: qty 1

## 2016-06-23 MED ORDER — KETOROLAC TROMETHAMINE 30 MG/ML IJ SOLN
30.0000 mg | Freq: Once | INTRAMUSCULAR | Status: AC
  Administered 2016-06-24: 30 mg via INTRAVENOUS
  Filled 2016-06-23: qty 1

## 2016-06-23 MED ORDER — POTASSIUM CHLORIDE CRYS ER 20 MEQ PO TBCR
60.0000 meq | EXTENDED_RELEASE_TABLET | Freq: Once | ORAL | Status: AC
  Administered 2016-06-24: 60 meq via ORAL
  Filled 2016-06-23: qty 3

## 2016-06-23 MED ORDER — SODIUM CHLORIDE 0.9 % IV BOLUS (SEPSIS)
1000.0000 mL | Freq: Once | INTRAVENOUS | Status: AC
  Administered 2016-06-24: 1000 mL via INTRAVENOUS

## 2016-06-23 MED ORDER — ACETAMINOPHEN 500 MG PO TABS
1000.0000 mg | ORAL_TABLET | Freq: Once | ORAL | Status: AC
  Administered 2016-06-24: 1000 mg via ORAL
  Filled 2016-06-23: qty 2

## 2016-06-23 NOTE — ED Triage Notes (Signed)
Pt. presents with multiple complaints : Gen. body aches , fever , dysuria , fatigue  and sore throat onset today .

## 2016-06-23 NOTE — ED Provider Notes (Addendum)
[facility] DEPT Provider Note   CSN: [id number] Arrival date & time: 06/23/16  1944  First Provider Contact:  11:21 PM   By signing my name below, I, Reola Mosher, attest that this documentation has been prepared under the direction and in the presence of Everlene Balls, MD. Electronically Signed: Reola Mosher, ED Scribe. 06/23/16. 11:34 PM.  History   Chief Complaint Chief Complaint  Patient presents with  . Generalized Body Aches  . Dysuria  . Sore Throat  . Fever   The history is provided by the patient. No language interpreter was used.   HPI Comments: Debbie [name] is a 40 y.o. female with a PMHx of  HTN, IBS, asthma, and panic attacks who presents to the Emergency Department with multiple complaints that all began approximately 12-14 hours PTA. Pt reports that she has had generalized body aches, a moderately sore throat, fatigue, HA, back pain, rhinorrhea, non-productive cough, and post-nasal drip onset this morning PTA. Pt is febrile in the ED with a temperature of 100.8. Pt took a dose of Tylenol approximately 13 hours prior to coming into the ED with no relief of her symptoms. Pt also notes that she has had dysuria x "for years".  She has had a UTI in the past, treated with antibiotics that she states do not work. She does have kids in her home, but denies them being sick. She denies vomiting, diarrhea, and nausea.   Past Medical History:  Diagnosis Date  . Asthma   . Burning with urination 05/24/2015  . BV (bacterial vaginosis) 05/24/2015  . Caries 04/30/2016  . Constipation   . Depression 05/24/2015  . GERD (gastroesophageal reflux disease)   . H/O estrogen therapy 09/05/2013  . Hematuria 05/24/2015  . Herpes simplex without mention of complication   . Hot flashes 09/05/2013   Uses patch  . Hypertension 05/24/2015  . IBS (irritable bowel syndrome)   . LLQ pain 12/28/2013  . Migraines    frequent  . OAB (overactive bladder) 12/28/2013  . Panic attack   .  Panic attacks 04/30/2016  . Vaginal discharge 05/24/2015    Patient Active Problem List   Diagnosis Date Noted  . Panic attacks 04/30/2016  . Caries 04/30/2016  . Vaginal discharge 05/24/2015  . BV (bacterial vaginosis) 05/24/2015  . Burning with urination 05/24/2015  . Hematuria 05/24/2015  . Hypertension 05/24/2015  . Depression 05/24/2015  . Nausea alone 08/29/2014  . Other malaise and fatigue 08/29/2014  . Headache(784.0) 08/29/2014  . LLQ pain 12/28/2013  . OAB (overactive bladder) 12/28/2013  . PUD (peptic ulcer disease) 12/05/2013  . Unspecified constipation 09/14/2013  . Hypokalemia 09/14/2013  . Hot flashes 09/05/2013  . H/O estrogen therapy 09/05/2013  . Change in bowel function 05/10/2012  . Constipation 05/10/2012  . Epigastric pain 05/10/2012  . Esophageal dysphagia 05/10/2012  . GERD (gastroesophageal reflux disease) 05/10/2012    Past Surgical History:  Procedure Laterality Date  . ABDOMINAL HYSTERECTOMY     right SOO  . BIOPSY N/A 09/06/2014   Procedure: BIOPSY;  Surgeon: Daneil Dolin, MD;  Location: [facility];  Service: Endoscopy;  Laterality: N/A;  . CESAREAN SECTION    . COLONOSCOPY N/A 09/25/2013   [id number] colonic polyp-removed as described above. Tubular adenoma. next TCS 08/2020  . ESOPHAGOGASTRODUODENOSCOPY N/A 09/25/2013   RMR: Schatzki's ring-not manipulated as outlined above/Hiatal hernia. Gastric ulcer and erosions status post biopsy/Normal duodenum through the third portion. Negative H.pylori  . ESOPHAGOGASTRODUODENOSCOPY (EGD) WITH PROPOFOL N/A  09/06/2014   [id number] s/p dilation  . MALONEY DILATION N/A 09/06/2014   Procedure: MALONEY DILATION; 52-54 french;  Surgeon: Daneil Dolin, MD;  Location: [facility];  Service: Endoscopy;  Laterality: N/A;    OB History    Gravida Para Term Preterm AB Living   4 4       4    SAB TAB Ectopic Multiple Live Births                   Home Medications    Prior to Admission medications   Medication  Sig Start Date End Date Taking? Authorizing Provider  acetaminophen (TYLENOL) 500 MG tablet Take 1,000 mg by mouth every 6 (six) hours as needed for mild pain.   Yes Historical Provider, MD  Aspirin-Salicylamide-Caffeine (BC HEADACHE POWDER PO) Take 1 Package by mouth daily as needed (headache).    Yes Historical Provider, MD  clonazePAM (KLONOPIN) 0.5 MG tablet Take 1 daily prn Patient taking differently: Take 0.5 mg by mouth 2 (two) times daily as needed for anxiety. Take 1 daily prn 04/30/16  Yes Estill Dooms, NP  DEXILANT 60 MG capsule TAKE (1) CAPSULE BY MOUTH ONCE DAILY. 01/20/16  Yes Carlis Stable, NP  linaclotide (LINZESS) 290 MCG CAPS capsule Take 290 mcg by mouth daily before breakfast.   Yes Historical Provider, MD    Family History Family History  Problem Relation Age of Onset  . Stroke Mother   . Cancer Father     ?esophageal  . Stroke Father   . Lung cancer Maternal Grandfather   . Hypertension Maternal Grandmother   . Stroke Maternal Grandmother   . Cancer Maternal Grandmother     breast  . Other Maternal Grandmother     fluid in legs  . Colon cancer Neg Hx     Social History Social History  Substance Use Topics  . Smoking status: Current Every Day Smoker    Packs/day: 0.50    Years: 21.00    Types: Cigarettes  . Smokeless tobacco: Never Used  . Alcohol use No     Allergies   Metronidazole; Advil [ibuprofen]; and Hydrochlorothiazide   Review of Systems Review of Systems 10 Systems reviewed and all are negative for acute change except as noted in the HPI.  Physical Exam Updated Vital Signs BP 125/78   Pulse 98   Temp 100.5 F (38.1 C) (Oral)   Resp 19   SpO2 100%   Physical Exam  Constitutional: She is oriented to person, place, and time. She appears well-developed and well-nourished. No distress.  HENT:  Head: Normocephalic and atraumatic.  Nose: Nose normal.  Mouth/Throat: Oropharynx is clear and moist. No oropharyngeal exudate.  Eyes:  Conjunctivae and EOM are normal. Pupils are equal, round, and reactive to light. No scleral icterus.  Neck: Normal range of motion. Neck supple. No JVD present. No tracheal deviation present. No thyromegaly present.  Cardiovascular: Regular rhythm and normal heart sounds.  Tachycardia present.  Exam reveals no gallop and no friction rub.   No murmur heard. Pulmonary/Chest: Effort normal and breath sounds normal. No respiratory distress. She has no wheezes. She exhibits no tenderness.  Abdominal: Soft. Bowel sounds are normal. She exhibits no distension and no mass. There is no tenderness. There is no rebound and no guarding.  Musculoskeletal: Normal range of motion. She exhibits no edema or tenderness.  Lymphadenopathy:    She has no cervical adenopathy.  Neurological: She is alert and oriented to person, place,  and time. No cranial nerve deficit. She exhibits normal muscle tone.  Skin: Skin is warm and dry. No rash noted. No erythema. No pallor.  Pt has a tactile fever.  Nursing note and vitals reviewed.    ED Treatments / Results  DIAGNOSTIC STUDIES: Oxygen Saturation is 100% on RA, normal by my interpretation.   COORDINATION OF CARE: 11:34 PM-Discussed next steps with pt. Pt verbalized understanding and is agreeable with the plan.   Labs (all labs ordered are listed, but only abnormal results are displayed) Labs Reviewed  CBC WITH DIFFERENTIAL/PLATELET - Abnormal; Notable for the following:       Result Value   Neutro Abs 8.4 (*)    All other components within normal limits  COMPREHENSIVE METABOLIC PANEL - Abnormal; Notable for the following:    Potassium 3.1 (*)    BUN <5 (*)    All other components within normal limits  URINALYSIS, ROUTINE W REFLEX MICROSCOPIC (NOT AT [facility]) - Abnormal; Notable for the following:    APPearance HAZY (*)    Leukocytes, UA SMALL (*)    All other components within normal limits  URINE MICROSCOPIC-ADD ON - Abnormal; Notable for the following:      Squamous Epithelial / LPF 0-5 (*)    Bacteria, UA RARE (*)    All other components within normal limits  RAPID STREP SCREEN (NOT AT [facility])  CULTURE, GROUP A STREP [facility])    EKG  EKG Interpretation  Date/Time:  Tuesday June 23 2016 22:00:31 EDT Ventricular Rate:  101 PR Interval:    QRS Duration: 76 QT Interval:  287 QTC Calculation: 372 R Axis:   49 Text Interpretation:  Sinus tachycardia Nonspecific T abnormalities, lateral leads Nonspecific ST abnormality Confirmed by Wyvonnia Dusky  MD, STEPHEN [phone]) on 06/23/2016 10:07:08 PM       Radiology Dg Chest 2 View  Result Date: 06/23/2016 CLINICAL DATA:  40 year old female with cough and congestion EXAM: CHEST  2 VIEW COMPARISON:  Chest radiograph dated 09/18/11 FINDINGS: The heart size and mediastinal contours are within normal limits. Both lungs are clear. The visualized skeletal structures are unremarkable. IMPRESSION: No active cardiopulmonary disease. Electronically Signed   By: Anner Crete M.D.   On: 06/23/2016 23:51   Procedures Procedures (including critical care time)  Medications Ordered in ED Medications - No data to display   Initial Impression / Assessment and Plan / ED Course  I have reviewed the triage vital signs and the nursing notes.  Pertinent labs & imaging results that were available during my care of the patient were reviewed by me and considered in my medical decision making (see chart for details).  Clinical Course    Patient presents to the ED for fever, congestion, and cough. EKG was obtained which shows NST changes since her last one 5 years prior.  May be results of her acute illness or hypokalemia.  Potassium was replaced. Will obtain CXR for evaluation. She was given IVF, tylenol, toradol and mucinex.     12:34 AM Patients symptoms have improved.  CXR neg for pneumonia. Likely viral URI.  Tylenol or ibuprofen for fever and pain.  PCP fu within 3 days for close follow up.  Return precautions  given.  She appears well and in NAD. VS remain within her normal limits and she Is safe for DC.  Final Clinical Impressions(s) / ED Diagnoses   Final diagnoses:  None    New Prescriptions New Prescriptions   No medications on file  I personally performed the services described in this documentation, which was scribed in my presence. The recorded information has been reviewed and is accurate.         Everlene Balls, MD 06/24/16 [phone]

## 2016-06-23 NOTE — ED Notes (Signed)
Pt ambulatory to room D34 from waiting room with tech; steady gait noted

## 2016-06-23 NOTE — ED Notes (Signed)
Patient transported to X-ray 

## 2016-06-26 LAB — CULTURE, GROUP A STREP (THRC)

## 2016-07-02 ENCOUNTER — Encounter (HOSPITAL_COMMUNITY): Payer: Self-pay | Admitting: *Deleted

## 2016-07-02 ENCOUNTER — Emergency Department (HOSPITAL_COMMUNITY)
Admission: EM | Admit: 2016-07-02 | Discharge: 2016-07-03 | Disposition: A | Discharge status: Home / Self Care | Payer: Medicaid Other | Attending: Dermatology | Admitting: Dermatology

## 2016-07-02 DIAGNOSIS — G43909 Migraine, unspecified, not intractable, without status migrainosus: Secondary | ICD-10-CM | POA: Insufficient documentation

## 2016-07-02 DIAGNOSIS — Z5321 Procedure and treatment not carried out due to patient leaving prior to being seen by health care provider: Secondary | ICD-10-CM | POA: Insufficient documentation

## 2016-07-02 MED ORDER — OXYCODONE-ACETAMINOPHEN 5-325 MG PO TABS
1.0000 | ORAL_TABLET | ORAL | Status: DC | PRN
  Administered 2016-07-02: 1 via ORAL
  Filled 2016-07-02: qty 1

## 2016-07-02 MED ORDER — ONDANSETRON 4 MG PO TBDP
4.0000 mg | ORAL_TABLET | Freq: Once | ORAL | Status: AC
  Administered 2016-07-02: 4 mg via ORAL
  Filled 2016-07-02: qty 1

## 2016-07-02 NOTE — ED Triage Notes (Signed)
Pt states that she has a recent URI and has been congested; pt states that she woke up this am with a migraine and nausea; pt states that she took [facility] powders without relief; pt denies vomiting; pt denies visual changes

## 2016-07-03 NOTE — ED Notes (Signed)
Writer called for room assignment, no response 

## 2016-07-07 ENCOUNTER — Telehealth: Payer: Self-pay | Admitting: Internal Medicine

## 2016-07-07 NOTE — Telephone Encounter (Signed)
PATIENT CALLED INQUIRING ABOUT THE DELAY IN HER DEXILANT REFILL

## 2016-07-08 ENCOUNTER — Encounter: Payer: Self-pay | Admitting: Internal Medicine

## 2016-07-08 ENCOUNTER — Other Ambulatory Visit: Payer: Self-pay | Admitting: Nurse Practitioner

## 2016-07-08 MED ORDER — DEXLANSOPRAZOLE 60 MG PO CPDR: 60.0000 mg | DELAYED_RELEASE_CAPSULE | Freq: Every day | ORAL | 2 refills | Status: DC

## 2016-07-08 NOTE — Telephone Encounter (Signed)
Routing to the refill box. 

## 2016-07-08 NOTE — Telephone Encounter (Signed)
Please schedule ov.  

## 2016-07-08 NOTE — Telephone Encounter (Signed)
Letter mailed

## 2016-07-08 NOTE — Telephone Encounter (Signed)
I looked through the refill box and this message is the first I've seen requesting a refill. I will send in a refill for a few months but at that point it will have been 2+ years since we've seen her (last OV she was a no show) and will need to be seen in the office for further refills.

## 2016-07-09 ENCOUNTER — Encounter: Payer: Self-pay | Admitting: Internal Medicine

## 2016-07-09 NOTE — Telephone Encounter (Signed)
Made appt and letter sent

## 2016-08-06 ENCOUNTER — Encounter: Payer: Self-pay | Admitting: Nurse Practitioner

## 2016-08-06 ENCOUNTER — Telehealth: Payer: Self-pay | Admitting: Nurse Practitioner

## 2016-08-06 ENCOUNTER — Ambulatory Visit: Payer: Medicaid Other | Admitting: Nurse Practitioner

## 2016-08-06 NOTE — Telephone Encounter (Signed)
Noted  

## 2016-08-06 NOTE — Telephone Encounter (Signed)
PT WAS A NO SHOW AND LETTER SENT  °

## 2016-08-17 ENCOUNTER — Encounter: Payer: Self-pay | Admitting: Gastroenterology

## 2016-08-17 ENCOUNTER — Ambulatory Visit (INDEPENDENT_AMBULATORY_CARE_PROVIDER_SITE_OTHER): Payer: Medicaid Other | Admitting: Gastroenterology

## 2016-08-17 VITALS — BP 128/93 | HR 79 | Temp 98.3°F | Ht 66.0 in | Wt 214.0 lb

## 2016-08-17 DIAGNOSIS — K219 Gastro-esophageal reflux disease without esophagitis: Secondary | ICD-10-CM

## 2016-08-17 DIAGNOSIS — K5900 Constipation, unspecified: Secondary | ICD-10-CM

## 2016-08-17 MED ORDER — LINACLOTIDE 290 MCG PO CAPS: 290.0000 ug | ORAL_CAPSULE | Freq: Every day | ORAL | 11 refills | Status: DC

## 2016-08-17 MED ORDER — DEXLANSOPRAZOLE 60 MG PO CPDR: 60.0000 mg | DELAYED_RELEASE_CAPSULE | Freq: Every day | ORAL | 11 refills | Status: DC

## 2016-08-17 NOTE — Patient Instructions (Addendum)
1. Please follow-up with your primary care regarding her headaches. We would prefer you not taking any BC or other aspirin powders for your headaches due to her history of gastric ulcers. 2. PLEASE FOLLOW UP WITH YOUR PCP FOR DIZZINESS AND FEELING OFF BALANCE. THIS IS A GOOD REASON TO SEE A NEUROLOGIST. YOU SHOULD HAVE IMAGING OF YOUR BRAIN. 3. Continued Dexilant and Linzess. Prescription sent to your pharmacy. 4. Office visit in 2 years or sooner if needed. 5. Next colonoscopy due in October 2021 for history of polyps.

## 2016-08-17 NOTE — Progress Notes (Signed)
Primary Care Physician: Leamon Arnt, MD  Primary Gastroenterologist:  Garfield Cornea, MD   Chief Complaint  Patient presents with  . Medication Refill    HPI: Debbie [name] is a 40 y.o. female here For follow-up. Last seen in September 2015. She had EGD in October 2014. She had a Schatzki ring not manipulated, gastric ulcer and erosions. Biopsies negative for H. pylori. Colonoscopy at the same time a single colon polyp, tubular adenoma, next colonoscopy due October 2021.History of BC powder use at the time. She has a history of chronic constipation as well. She did have a repeat EGD eventually in October 2015 to document gastric ulcer healing. Ulcer had healed, she did have some gastric erosions. Her esophagus appeared abnormal at that time with a ringed appearance. Biopsies were unremarkable with no evidence of eosinophilic esophagitis.  PCP stopped Linzess and started Amitiza but didn't work. BM daily back on Linzess. 1-2 large stools daily. No melena, brbpr. Dexilant controls gerd symptoms. Cannot miss a dose because of recurrent symptoms. No dysphagia. Denies abdominal pain. Continues to suffer with headaches. Tries to cut back on BCs but having problems controlling symptoms.Imitrex didn't help headaches. Topamax didn't help.   PATIENT STATES HER PCP WILL NOT PROVIDE HER WITH NEUROLOGY REFERRAL FOR REFRACTORY HEADACHES. PATIENT COMPLAINS OF FEELING DIZZY AND OFF BALANCE.  Current Outpatient Prescriptions  Medication Sig Dispense Refill  . acetaminophen (TYLENOL) 500 MG tablet Take 1,000 mg by mouth every 6 (six) hours as needed for mild pain.    . Aspirin-Salicylamide-Caffeine (BC HEADACHE POWDER PO) Take 1 Package by mouth daily as needed (headache).     . clonazePAM (KLONOPIN) 0.5 MG tablet Take 1 daily prn (Patient taking differently: Take 0.5 mg by mouth 2 (two) times daily as needed for anxiety. Take 1 daily prn) 30 tablet 0  . dexlansoprazole (DEXILANT) 60 MG capsule Take  1 capsule (60 mg total) by mouth daily. 30 capsule 2  . linaclotide (LINZESS) 290 MCG CAPS capsule Take 290 mcg by mouth daily before breakfast.     No current facility-administered medications for this visit.     Allergies as of 08/17/2016 - Review Complete 08/17/2016  Allergen Reaction Noted  . Metronidazole Swelling 08/19/2011  . Advil [ibuprofen] Palpitations 08/19/2011  . Hydrochlorothiazide Palpitations 07/14/2015    ROS:  General: Negative for anorexia, weight loss, fever, chills, fatigue, weakness. ENT: Negative for hoarseness, difficulty swallowing , nasal congestion. CV: Negative for chest pain, angina, palpitations, dyspnea on exertion, peripheral edema.  Respiratory: Negative for dyspnea at rest, dyspnea on exertion, cough, sputum, wheezing.  GI: See history of present illness. GU:  Negative for dysuria, hematuria, urinary incontinence, urinary frequency, nocturnal urination.  Endo: Negative for unusual weight change.    Physical Examination:   BP (!) 128/93   Pulse 79   Temp 98.3 F (36.8 C) (Oral)   Ht 5\' 6"  (1.676 m)   Wt 214 lb (97.1 kg)   BMI 34.54 kg/m   General: Well-nourished, well-developed in no acute distress.  Eyes: No icterus. Mouth: Oropharyngeal mucosa moist and pink , no lesions erythema or exudate. Lungs: Clear to auscultation bilaterally.  Heart: Regular rate and rhythm, no murmurs rubs or gallops.  Abdomen: Bowel sounds are normal, nontender, nondistended, no hepatosplenomegaly or masses, no abdominal bruits or hernia , no rebound or guarding.   Extremities: No lower extremity edema. No clubbing or deformities. Neuro: Alert and oriented x 4   Skin: Warm and dry, no jaundice.  Psych: Alert and cooperative, normal mood and affect.    Lab Results  Component Value Date   WBC 10.4 06/23/2016   HGB 12.9 06/23/2016   HCT 38.8 06/23/2016   MCV 94.4 06/23/2016   PLT 280 06/23/2016   Lab Results  Component Value Date   CREATININE 0.71  06/23/2016   BUN <5 (L) 06/23/2016   NA 139 06/23/2016   K 3.1 (L) 06/23/2016   CL 108 06/23/2016   CO2 23 06/23/2016   Lab Results  Component Value Date   WBC 10.4 06/23/2016   HGB 12.9 06/23/2016   HCT 38.8 06/23/2016   MCV 94.4 06/23/2016   PLT 280 06/23/2016   Lab Results  Component Value Date   ALT 22 06/23/2016   AST 19 06/23/2016   ALKPHOS 64 06/23/2016   BILITOT 0.5 06/23/2016

## 2016-08-17 NOTE — Progress Notes (Signed)
CC'ED TO PCP 

## 2016-08-17 NOTE — Assessment & Plan Note (Signed)
Doing well on Linzess  290 g daily. Refill provided. Follow-up in 2 years or sooner if needed.

## 2016-08-17 NOTE — Assessment & Plan Note (Signed)
Doing well Dexilant. Refills provided. Recommend avoiding BC powders/aspirin powders. Follow-up with PCP for migraine headaches. Return to the office in 2 years or sooner if needed.

## 2016-11-18 ENCOUNTER — Ambulatory Visit: Payer: Medicaid Other | Admitting: Adult Health

## 2016-11-20 ENCOUNTER — Ambulatory Visit (INDEPENDENT_AMBULATORY_CARE_PROVIDER_SITE_OTHER): Payer: Medicaid Other | Admitting: Gastroenterology

## 2016-11-20 ENCOUNTER — Encounter: Payer: Self-pay | Admitting: Gastroenterology

## 2016-11-20 ENCOUNTER — Other Ambulatory Visit: Payer: Self-pay

## 2016-11-20 VITALS — BP 135/93 | HR 84 | Temp 98.0°F | Ht 66.0 in | Wt 223.0 lb

## 2016-11-20 DIAGNOSIS — R1310 Dysphagia, unspecified: Secondary | ICD-10-CM | POA: Diagnosis not present

## 2016-11-20 DIAGNOSIS — K5900 Constipation, unspecified: Secondary | ICD-10-CM | POA: Diagnosis not present

## 2016-11-20 DIAGNOSIS — R1013 Epigastric pain: Secondary | ICD-10-CM | POA: Diagnosis not present

## 2016-11-20 DIAGNOSIS — R1319 Other dysphagia: Secondary | ICD-10-CM

## 2016-11-20 DIAGNOSIS — K219 Gastro-esophageal reflux disease without esophagitis: Secondary | ICD-10-CM

## 2016-11-20 NOTE — Assessment & Plan Note (Signed)
Ongoing refractory GERD, esophageal dysphagia, abdominal pain. On daily Dexilant but continues to take frequent BC powders. History of peptic ulcer disease in the past as previously outlined. Last EGD 2 years ago. Abnormal esophagus suggestive of eosinophilic esophagitis but biopsies did not support that diagnosis. Now having frequent dysphagia predominantly to pills. Frequent burning up into her throat and abdominal pain. Has been evaluated in the ER at [facility] however I do not have access to these records. She may have esophageal with, ring, stricture. Cannot rule out development of the eosinophilic esophagitis, or complicated GERD. Given her history of peptic ulcer disease and ongoing BC powder use, this may explain her abdominal pain. Plan on EGD/ED in the OR with deep sedation in the near future.  I have discussed the risks, alternatives, benefits with regards to but not limited to the risk of reaction to medication, bleeding, infection, perforation and the patient is agreeable to proceed. Written consent to be obtained.  We will request records or prior CT scan and labs done at [facility].

## 2016-11-20 NOTE — Assessment & Plan Note (Signed)
Continue Linzess as before.

## 2016-11-20 NOTE — Progress Notes (Signed)
Please obtain copy of labs, ct a/p done during ER visit at [facility] within past couple of months.

## 2016-11-20 NOTE — Patient Instructions (Signed)
1. Upper endoscopy with Dr. Rourk. See separate instructions.  

## 2016-11-20 NOTE — Progress Notes (Signed)
Primary Care Physician: Leamon Arnt, MD  Primary Gastroenterologist:  Garfield Cornea, MD   Chief Complaint  Patient presents with  . Dysphagia    mostly with med, gets stuck below throat burps then can feel in throat  . Abdominal Pain    mid upper and across mid abd, went to ER at [facility], uses heating pad    HPI: Debbie [name] is a 40 y.o. female here for follow-up. She was last seen in September 2017. Last EGD October 2014, Schatzki ring not manipulated, gastric ulcer and erosions. Biopsies negative for H. pylori. Colonoscopy at that same time with a single colon polyp, tubular adenoma, next colonoscopy due October 2021. She has a history of BC powder use, chronic constipation. She did have a repeat EGD in October 2015 to document gastric ulcer healing, ulcer had healed and she did have some gastric erosions however. Her esophagus appeared abnormal at that time with a ringed appearance. Biopsies were unremarkable with no evidence of eosinophilic esophagitis.  Couple of months of swallowing concerns with pills mostly. Some solid foods as well. A lot of burning in the throat with anything she eats/drinks. Bad heartburn and nausea. No vomiting. Generalized abdominal pain and mostly in upper abdomen. Going on for several weeks. When these symptoms first started, she went to ER at [facility]. She states she had normal CT and no explanation for abd pain. Uses heating pad on abdomen.   Saw migraine specialist. Started her back on topamax. Does not feel like it helps. Still uses frequent BCs.    If she misses dose of Dexilant, symptoms really severe. Linzess controls constipation.     Current Outpatient Prescriptions  Medication Sig Dispense Refill  . Aspirin-Salicylamide-Caffeine (BC HEADACHE POWDER PO) Take 1 Package by mouth daily as needed (headache).     . clonazePAM (KLONOPIN) 0.5 MG tablet Take 1 daily prn (Patient taking differently: Take 0.5 mg by mouth 2 (two)  times daily as needed for anxiety. Take 1 daily prn) 30 tablet 0  . dexlansoprazole (DEXILANT) 60 MG capsule Take 1 capsule (60 mg total) by mouth daily before breakfast. 30 capsule 11  . escitalopram (LEXAPRO) 20 MG tablet Take 20 mg by mouth daily.    Marland Kitchen linaclotide (LINZESS) 290 MCG CAPS capsule Take 1 capsule (290 mcg total) by mouth daily before breakfast. 30 capsule 11  . topiramate (TOPAMAX) 25 MG tablet Take 25 mg by mouth at bedtime.  0   No current facility-administered medications for this visit.     Allergies as of 11/20/2016 - Review Complete 11/20/2016  Allergen Reaction Noted  . Metronidazole Swelling 08/19/2011  . Advil [ibuprofen] Palpitations 08/19/2011  . Hydrochlorothiazide Palpitations 07/14/2015   Past Medical History:  Diagnosis Date  . Asthma   . Burning with urination 05/24/2015  . BV (bacterial vaginosis) 05/24/2015  . Caries 04/30/2016  . Constipation   . Depression 05/24/2015  . GERD (gastroesophageal reflux disease)   . H/O estrogen therapy 09/05/2013  . Hematuria 05/24/2015  . Herpes simplex without mention of complication   . Hot flashes 09/05/2013   Uses patch  . Hypertension 05/24/2015  . IBS (irritable bowel syndrome)   . LLQ pain 12/28/2013  . Migraines    frequent  . OAB (overactive bladder) 12/28/2013  . Panic attack   . Panic attacks 04/30/2016  . Vaginal discharge 05/24/2015   Past Surgical History:  Procedure Laterality Date  . ABDOMINAL HYSTERECTOMY     right  SOO  . BIOPSY N/A 09/06/2014   Procedure: BIOPSY;  Surgeon: Daneil Dolin, MD;  Location: [facility];  Service: Endoscopy;  Laterality: N/A;  . CESAREAN SECTION    . COLONOSCOPY N/A 09/25/2013   [id number] colonic polyp-removed as described above. Tubular adenoma. next TCS 08/2020  . ESOPHAGOGASTRODUODENOSCOPY N/A 09/25/2013   RMR: Schatzki's ring-not manipulated as outlined above/Hiatal hernia. Gastric ulcer and erosions status post biopsy/Normal duodenum through the third portion. Negative  H.pylori  . ESOPHAGOGASTRODUODENOSCOPY (EGD) WITH PROPOFOL N/A 09/06/2014   [id number] s/p dilation  . MALONEY DILATION N/A 09/06/2014   Procedure: MALONEY DILATION; 52-54 french;  Surgeon: Daneil Dolin, MD;  Location: [facility];  Service: Endoscopy;  Laterality: N/A;   Family History  Problem Relation Age of Onset  . Stroke Mother   . Cancer Father     ?esophageal  . Stroke Father   . Lung cancer Maternal Grandfather   . Hypertension Maternal Grandmother   . Stroke Maternal Grandmother   . Cancer Maternal Grandmother     breast  . Other Maternal Grandmother     fluid in legs  . Colon cancer Neg Hx    Social History   Social History  . Marital status: Divorced    Spouse name: N/A  . Number of children: 3  . Years of education: N/A   Occupational History  . unemployed    Social History Main Topics  . Smoking status: Current Every Day Smoker    Packs/day: 0.50    Years: 21.00    Types: Cigarettes  . Smokeless tobacco: Never Used  . Alcohol use No  . Drug use: No  . Sexual activity: Yes    Birth control/ protection: Surgical     Comment: hyst   Other Topics Concern  . Not on file   Social History Narrative  . No narrative on file    ROS:  General: Negative for anorexia, weight loss, fever, chills, fatigue, weakness. ENT: Negative for hoarseness, nasal congestion. See hpi.  CV: Negative for chest pain, angina, palpitations, dyspnea on exertion, peripheral edema.  Respiratory: Negative for dyspnea at rest, dyspnea on exertion, cough, sputum, wheezing.  GI: See history of present illness. GU:  Negative for dysuria, hematuria, urinary incontinence, urinary frequency, nocturnal urination.  Endo: Negative for unusual weight change.    Physical Examination:   BP (!) 135/93   Pulse 84   Temp 98 F (36.7 C) (Oral)   Ht 5\' 6"  (1.676 m)   Wt 223 lb (101.2 kg)   BMI 35.99 kg/m   General: Well-nourished, well-developed in no acute distress.  Eyes: No  icterus. Mouth: Oropharyngeal mucosa moist and pink , no lesions erythema or exudate. Lungs: Clear to auscultation bilaterally.  Heart: Regular rate and rhythm, no murmurs rubs or gallops.  Abdomen: Bowel sounds are normal, mild to moderate epigastric tenderness,  nondistended, no hepatosplenomegaly or masses, no abdominal bruits or hernia , no rebound or guarding.   Extremities: No lower extremity edema. No clubbing or deformities. Neuro: Alert and oriented x 4   Skin: Warm and dry, no jaundice.   Psych: Alert and cooperative, normal mood and affect.  Labs:  Lab Results  Component Value Date   CREATININE 0.71 06/23/2016   BUN <5 (L) 06/23/2016   NA 139 06/23/2016   K 3.1 (L) 06/23/2016   CL 108 06/23/2016   CO2 23 06/23/2016   Lab Results  Component Value Date   ALT 22 06/23/2016   AST 19  06/23/2016   ALKPHOS 64 06/23/2016   BILITOT 0.5 06/23/2016   Lab Results  Component Value Date   WBC 10.4 06/23/2016   HGB 12.9 06/23/2016   HCT 38.8 06/23/2016   MCV 94.4 06/23/2016   PLT 280 06/23/2016    Imaging Studies: No results found.

## 2016-11-25 NOTE — Progress Notes (Signed)
CC'D TO PCP °

## 2016-12-10 NOTE — Patient Instructions (Signed)
Debbie [name]  12/10/2016     @PREFPERIOPPHARMACY @   Your procedure is scheduled on  12/17/2016   Report to [facility] at  730  A.M.  Call this number if you have problems the morning of surgery:  [phone]   Remember:  Do not eat food or drink liquids after midnight.  Take these medicines the morning of surgery with A SIP OF WATER  Klonopin, dexilant, lexapro, linzess, topamax.   Do not wear jewelry, make-up or nail polish.  Do not wear lotions, powders, or perfumes, or deoderant.  Do not shave 48 hours prior to surgery.  Men may shave face and neck.  Do not bring valuables to the hospital.  [facility] is not responsible for any belongings or valuables.  Contacts, dentures or bridgework may not be worn into surgery.  Leave your suitcase in the car.  After surgery it may be brought to your room.  For patients admitted to the hospital, discharge time will be determined by your treatment team.  Patients discharged the day of surgery will not be allowed to drive home.   Name and phone number of your driver:   family Special instructions:  Follow the diet instructions given to you by Dr Roseanne Kaufman office.  Please read over the following fact sheets that you were given. Anesthesia Post-op Instructions and Care and Recovery After Surgery       Esophagogastroduodenoscopy Introduction Esophagogastroduodenoscopy (EGD) is a procedure to examine the lining of the esophagus, stomach, and first part of the small intestine (duodenum). This procedure is done to check for problems such as inflammation, bleeding, ulcers, or growths. During this procedure, a long, flexible, lighted tube with a camera attached (endoscope) is inserted down the throat. Tell a health care provider about:  Any allergies you have.  All medicines you are taking, including vitamins, herbs, eye drops, creams, and over-the-counter medicines.  Any problems you or family members have had with  anesthetic medicines.  Any blood disorders you have.  Any surgeries you have had.  Any medical conditions you have.  Whether you are pregnant or may be pregnant. What are the risks? Generally, this is a safe procedure. However, problems may occur, including:  Infection.  Bleeding.  A tear (perforation) in the esophagus, stomach, or duodenum.  Trouble breathing.  Excessive sweating.  Spasms of the larynx.  A slowed heartbeat.  Low blood pressure. What happens before the procedure?  Follow instructions from your health care provider about eating or drinking restrictions.  Ask your health care provider about:  Changing or stopping your regular medicines. This is especially important if you are taking diabetes medicines or blood thinners.  Taking medicines such as aspirin and ibuprofen. These medicines can thin your blood. Do not take these medicines before your procedure if your health care provider instructs you not to.  Plan to have someone take you home after the procedure.  If you wear dentures, be ready to remove them before the procedure. What happens during the procedure?  To reduce your risk of infection, your health care team will wash or sanitize their hands.  An IV tube will be put in a vein in your hand or arm. You will get medicines and fluids through this tube.  You will be given one or more of the following:  A medicine to help you relax (sedative).  A medicine to numb the area (local anesthetic). This medicine  may be sprayed into your throat. It will make you feel more comfortable and keep you from gagging or coughing during the procedure.  A medicine for pain.  A mouth guard may be placed in your mouth to protect your teeth and to keep you from biting on the endoscope.  You will be asked to lie on your left side.  The endoscope will be lowered down your throat into your esophagus, stomach, and duodenum.  Air will be put into the endoscope.  This will help your health care provider see better.  The lining of your esophagus, stomach, and duodenum will be examined.  Your health care provider may:  Take a tissue sample so it can be looked at in a lab (biopsy).  Remove growths.  Remove objects (foreign bodies) that are stuck.  Treat any bleeding with medicines or other devices that stop tissue from bleeding.  Widen (dilate) or stretch narrowed areas of your esophagus and stomach.  The endoscope will be taken out. The procedure may vary among health care providers and hospitals. What happens after the procedure?  Your blood pressure, heart rate, breathing rate, and blood oxygen level will be monitored often until the medicines you were given have worn off.  Do not eat or drink anything until the numbing medicine has worn off and your gag reflex has returned. This information is not intended to replace advice given to you by your health care provider. Make sure you discuss any questions you have with your health care provider. Document Released: 03/19/2005 Document Revised: 04/23/2016 Document Reviewed: 10/10/2015  2017 Elsevier Esophagogastroduodenoscopy, Care After Introduction Refer to this sheet in the next few weeks. These instructions provide you with information about caring for yourself after your procedure. Your health care provider may also give you more specific instructions. Your treatment has been planned according to current medical practices, but problems sometimes occur. Call your health care provider if you have any problems or questions after your procedure. What can I expect after the procedure? After the procedure, it is common to have:  A sore throat.  Nausea.  Bloating.  Dizziness.  Fatigue. Follow these instructions at home:  Do not eat or drink anything until the numbing medicine (local anesthetic) has worn off and your gag reflex has returned. You will know that the local anesthetic has worn  off when you can swallow comfortably.  Do not drive for 24 hours if you received a medicine to help you relax (sedative).  If your health care provider took a tissue sample for testing during the procedure, make sure to get your test results. This is your responsibility. Ask your health care provider or the department performing the test when your results will be ready.  Keep all follow-up visits as told by your health care provider. This is important. Contact a health care provider if:  You cannot stop coughing.  You are not urinating.  You are urinating less than usual. Get help right away if:  You have trouble swallowing.  You cannot eat or drink.  You have throat or chest pain that gets worse.  You are dizzy or light-headed.  You faint.  You have nausea or vomiting.  You have chills.  You have a fever.  You have severe abdominal pain.  You have black, tarry, or bloody stools. This information is not intended to replace advice given to you by your health care provider. Make sure you discuss any questions you have with your health care provider.  Document Released: 11/02/2012 Document Revised: 04/23/2016 Document Reviewed: 10/10/2015  2017 Elsevier  Esophageal Dilatation Esophageal dilatation is a procedure to open a blocked or narrowed part of the esophagus. The esophagus is the long tube in your throat that carries food and liquid from your mouth to your stomach. The procedure is also called esophageal dilation. You may need this procedure if you have a buildup of scar tissue in your esophagus that makes it difficult, painful, or even impossible to swallow. This can be caused by gastroesophageal reflux disease (GERD). In rare cases, people need this procedure because they have cancer of the esophagus or a problem with the way food moves through the esophagus. Sometimes you may need to have another dilatation to enlarge the opening of the esophagus gradually. Tell a  health care provider about:  Any allergies you have.  All medicines you are taking, including vitamins, herbs, eye drops, creams, and over-the-counter medicines.  Any problems you or family members have had with anesthetic medicines.  Any blood disorders you have.  Any surgeries you have had.  Any medical conditions you have.  Any antibiotic medicines you are required to take before dental procedures. What are the risks? Generally, this is a safe procedure. However, problems can occur and include:  Bleeding from a tear in the lining of the esophagus.  A hole (perforation) in the esophagus. What happens before the procedure?  Do not eat or drink anything after midnight on the night before the procedure or as directed by your health care provider.  Ask your health care provider about changing or stopping your regular medicines. This is especially important if you are taking diabetes medicines or blood thinners.  Plan to have someone take you home after the procedure. What happens during the procedure?  You will be given a medicine that makes you relaxed and sleepy (sedative).  A medicine may be sprayed or gargled to numb the back of the throat.  Your health care provider can use various instruments to do an esophageal dilatation. During the procedure, the instrument used will be placed in your mouth and passed down into your esophagus. Options include:  Simple dilators. This instrument is carefully placed in the esophagus to stretch it.  Guided wire bougies. In this method, a flexible tube (endoscope) is used to insert a wire into the esophagus. The dilator is passed over this wire to enlarge the esophagus. Then the wire is removed.  Balloon dilators. An endoscope with a small balloon at the end is passed down into the esophagus. Inflating the balloon gently stretches the esophagus and opens it up. What happens after the procedure?  Your blood pressure, heart rate, breathing  rate, and blood oxygen level will be monitored often until the medicines you were given have worn off.  Your throat may feel slightly sore and will probably still feel numb. This will improve slowly over time.  You will not be allowed to eat or drink until the throat numbness has resolved.  If this is a same-day procedure, you may be allowed to go home once you have been able to drink, urinate, and sit on the edge of the bed without nausea or dizziness.  If this is a same-day procedure, you should have a friend or family member with you for the next 24 hours after the procedure. This information is not intended to replace advice given to you by your health care provider. Make sure you discuss any questions you have with your health  care provider. Document Released: 01/07/2006 Document Revised: 04/23/2016 Document Reviewed: 03/28/2014 Elsevier Interactive Patient Education  2017 [place] Anesthesia is a term that refers to techniques, procedures, and medicines that help a person stay safe and comfortable during a medical procedure. Monitored anesthesia care, or sedation, is one type of anesthesia. Your anesthesia specialist may recommend sedation if you will be having a procedure that does not require you to be unconscious, such as:  Cataract surgery.  A dental procedure.  A biopsy.  A colonoscopy. During the procedure, you may receive a medicine to help you relax (sedative). There are three levels of sedation:  Mild sedation. At this level, you may feel awake and relaxed. You will be able to follow directions.  Moderate sedation. At this level, you will be sleepy. You may not remember the procedure.  Deep sedation. At this level, you will be asleep. You will not remember the procedure. The more medicine you are given, the deeper your level of sedation will be. Depending on how you respond to the procedure, the anesthesia specialist may change your level  of sedation or the type of anesthesia to fit your needs. An anesthesia specialist will monitor you closely during the procedure. Let your health care provider know about:  Any allergies you have.  All medicines you are taking, including vitamins, herbs, eye drops, creams, and over-the-counter medicines.  Any use of steroids (by mouth or as a cream).  Any problems you or family members have had with sedatives and anesthetic medicines.  Any blood disorders you have.  Any surgeries you have had.  Any medical conditions you have, such as sleep apnea.  Whether you are pregnant or may be pregnant.  Any use of cigarettes, alcohol, or street drugs. What are the risks? Generally, this is a safe procedure. However, problems may occur, including:  Getting too much medicine (oversedation).  Nausea.  Allergic reaction to medicines.  Trouble breathing. If this happens, a breathing tube may be used to help with breathing. It will be removed when you are awake and breathing on your own.  Heart trouble.  Lung trouble. Before the procedure Staying hydrated  Follow instructions from your health care provider about hydration, which may include:  Up to 2 hours before the procedure - you may continue to drink clear liquids, such as water, clear fruit juice, black coffee, and plain tea. Eating and drinking restrictions  Follow instructions from your health care provider about eating and drinking, which may include:  8 hours before the procedure - stop eating heavy meals or foods such as meat, fried foods, or fatty foods.  6 hours before the procedure - stop eating light meals or foods, such as toast or cereal.  6 hours before the procedure - stop drinking milk or drinks that contain milk.  2 hours before the procedure - stop drinking clear liquids. Medicines  Ask your health care provider about:  Changing or stopping your regular medicines. This is especially important if you are taking  diabetes medicines or blood thinners.  Taking medicines such as aspirin and ibuprofen. These medicines can thin your blood. Do not take these medicines before your procedure if your health care provider instructs you not to. Tests and exams  You will have a physical exam.  You may have blood tests done to show:  How well your kidneys and liver are working.  How well your blood can clot.  General instructions  Plan to have someone  take you home from the hospital or clinic.  If you will be going home right after the procedure, plan to have someone with you for 24 hours. What happens during the procedure?  Your blood pressure, heart rate, breathing, level of pain and overall condition will be monitored.  An IV tube will be inserted into one of your veins.  Your anesthesia specialist will give you medicines as needed to keep you comfortable during the procedure. This may mean changing the level of sedation.  The procedure will be performed. After the procedure  Your blood pressure, heart rate, breathing rate, and blood oxygen level will be monitored until the medicines you were given have worn off.  Do not drive for 24 hours if you received a sedative.  You may:  Feel sleepy, clumsy, or nauseous.  Feel forgetful about what happened after the procedure.  Have a sore throat if you had a breathing tube during the procedure.  Vomit. This information is not intended to replace advice given to you by your health care provider. Make sure you discuss any questions you have with your health care provider. Document Released: 08/12/2005 Document Revised: 04/24/2016 Document Reviewed: 03/08/2016 Elsevier Interactive Patient Education  2017 [place], Care After These instructions provide you with information about caring for yourself after your procedure. Your health care provider may also give you more specific instructions. Your treatment has been planned  according to current medical practices, but problems sometimes occur. Call your health care provider if you have any problems or questions after your procedure. What can I expect after the procedure? After your procedure, it is common to:  Feel sleepy for several hours.  Feel clumsy and have poor balance for several hours.  Feel forgetful about what happened after the procedure.  Have poor judgment for several hours.  Feel nauseous or vomit.  Have a sore throat if you had a breathing tube during the procedure. Follow these instructions at home: For at least 24 hours after the procedure:   Do not:  Participate in activities in which you could fall or become injured.  Drive.  Use heavy machinery.  Drink alcohol.  Take sleeping pills or medicines that cause drowsiness.  Make important decisions or sign legal documents.  Take care of children on your own.  Rest. Eating and drinking  Follow the diet that is recommended by your health care provider.  If you vomit, drink water, juice, or soup when you can drink without vomiting.  Make sure you have little or no nausea before eating solid foods. General instructions  Have a responsible adult stay with you until you are awake and alert.  Take over-the-counter and prescription medicines only as told by your health care provider.  If you smoke, do not smoke without supervision.  Keep all follow-up visits as told by your health care provider. This is important. Contact a health care provider if:  You keep feeling nauseous or you keep vomiting.  You feel light-headed.  You develop a rash.  You have a fever. Get help right away if:  You have trouble breathing. This information is not intended to replace advice given to you by your health care provider. Make sure you discuss any questions you have with your health care provider. Document Released: 03/08/2016 Document Revised: 07/08/2016 Document Reviewed:  03/08/2016 Elsevier Interactive Patient Education  2017 [employer].

## 2016-12-15 ENCOUNTER — Encounter (HOSPITAL_COMMUNITY): Payer: Self-pay

## 2016-12-15 ENCOUNTER — Encounter (HOSPITAL_COMMUNITY)
Admission: RE | Admit: 2016-12-15 | Discharge: 2016-12-15 | Disposition: A | Discharge status: Home / Self Care | Payer: Medicaid Other | Source: Ambulatory Visit | Attending: Internal Medicine | Admitting: Internal Medicine

## 2016-12-17 ENCOUNTER — Ambulatory Visit (HOSPITAL_COMMUNITY): Admission: RE | Admit: 2016-12-17 | Payer: Medicaid Other | Source: Ambulatory Visit | Admitting: Internal Medicine

## 2016-12-17 ENCOUNTER — Encounter (HOSPITAL_COMMUNITY): Admission: RE | Payer: Self-pay | Source: Ambulatory Visit

## 2016-12-17 SURGERY — ESOPHAGOGASTRODUODENOSCOPY (EGD) WITH PROPOFOL
Anesthesia: Monitor Anesthesia Care

## 2016-12-22 ENCOUNTER — Telehealth: Payer: Self-pay

## 2016-12-22 NOTE — Telephone Encounter (Signed)
LMOVM and asked pt to call office to reschedule EGD/ED with Propofol with RMR that was cancelled last week due to inclement weather.

## 2016-12-24 NOTE — Telephone Encounter (Signed)
Pt has not called office to reschedule procedure. Letter mailed to pt.

## 2017-02-15 ENCOUNTER — Other Ambulatory Visit: Payer: Self-pay | Admitting: Adult Health

## 2017-08-24 ENCOUNTER — Other Ambulatory Visit: Payer: Self-pay

## 2017-08-29 MED ORDER — DEXLANSOPRAZOLE 60 MG PO CPDR: 60.0000 mg | DELAYED_RELEASE_CAPSULE | Freq: Every day | ORAL | 11 refills | Status: DC

## 2017-09-20 ENCOUNTER — Other Ambulatory Visit: Payer: Self-pay | Admitting: Gastroenterology

## 2018-01-03 ENCOUNTER — Telehealth: Payer: Self-pay

## 2018-01-03 DIAGNOSIS — K219 Gastro-esophageal reflux disease without esophagitis: Secondary | ICD-10-CM

## 2018-01-03 DIAGNOSIS — K5900 Constipation, unspecified: Secondary | ICD-10-CM

## 2018-01-03 MED ORDER — LINACLOTIDE 290 MCG PO CAPS: 290.0000 ug | ORAL_CAPSULE | Freq: Every day | ORAL | 5 refills | Status: DC

## 2018-01-03 MED ORDER — DEXLANSOPRAZOLE 60 MG PO CPDR: DELAYED_RELEASE_CAPSULE | ORAL | 5 refills | Status: DC

## 2018-01-03 NOTE — Telephone Encounter (Signed)
Please tell the patient Rx sent to pharmacy as requested. 

## 2018-01-03 NOTE — Telephone Encounter (Signed)
Pt called office. She recently changed pharmacies. She requests new rx for Dexilant 60mg  and Linzess 123mcg sent to [facility] in [place]. (her previous pharmacy was unable to transfer rx).

## 2018-01-03 NOTE — Telephone Encounter (Signed)
Unable to contact pt by phone d/t fast busy signal. Letter mailed.

## 2018-06-29 ENCOUNTER — Other Ambulatory Visit: Payer: Self-pay | Admitting: Nurse Practitioner

## 2018-06-29 DIAGNOSIS — K219 Gastro-esophageal reflux disease without esophagitis: Secondary | ICD-10-CM

## 2018-06-29 DIAGNOSIS — K5900 Constipation, unspecified: Secondary | ICD-10-CM

## 2018-07-11 ENCOUNTER — Encounter: Payer: Self-pay | Admitting: Internal Medicine

## 2018-08-31 ENCOUNTER — Telehealth: Payer: Self-pay | Admitting: Internal Medicine

## 2018-08-31 NOTE — Telephone Encounter (Signed)
[phone]  Patient called and said her dexilant is not covered by medicaid anymore, please call

## 2018-09-01 NOTE — Telephone Encounter (Signed)
Spoke with pt. Will look into doing a PA.

## 2018-09-05 ENCOUNTER — Telehealth: Payer: Self-pay | Admitting: Internal Medicine

## 2018-09-05 NOTE — Telephone Encounter (Signed)
Tried calling pt. Number was busy.

## 2018-09-05 NOTE — Telephone Encounter (Signed)
Patient called inquiring if her medication was sent to the pharmacy yet and she also has a question about linzess prescription, please call

## 2018-09-06 NOTE — Telephone Encounter (Signed)
Samples of Dexilant were left up front for pt while were waiting on an approval or denial of Dexilant.

## 2018-09-06 NOTE — Telephone Encounter (Signed)
Pt called her pharmacy and asked them to send a refill request for the Linzess 145 mcg and we haven't received it. Pt would like it called into [facility] [address] instead of [facility].

## 2018-09-06 NOTE — Telephone Encounter (Signed)
Lmom, waiting on a return call.  

## 2018-09-07 ENCOUNTER — Other Ambulatory Visit: Payer: Self-pay

## 2018-09-07 ENCOUNTER — Encounter: Payer: Self-pay | Admitting: Internal Medicine

## 2018-09-07 MED ORDER — LINACLOTIDE 145 MCG PO CAPS: 145.0000 ug | ORAL_CAPSULE | Freq: Every day | ORAL | 2 refills | Status: DC

## 2018-09-07 NOTE — Telephone Encounter (Signed)
Noted. Linzess sent to pts pharmacy. Please schedule appointment and mail to pt.

## 2018-09-07 NOTE — Telephone Encounter (Signed)
PATIENT SCHEDULED AND LETTER SENT  °

## 2018-09-07 NOTE — Telephone Encounter (Signed)
I'm sorry but I cannot find her pharmacy with info provided.   You can send in RX for Linzess 119mcg take one daily before breakfast. #30 with 2 refills.   Patient last seen in 10/2016. Needs ov.

## 2018-09-07 NOTE — Telephone Encounter (Signed)
PA for Dexilant 60 mg was approved. Pt is aware. Approval letter will be scanned in chart once received.

## 2018-12-09 ENCOUNTER — Encounter: Payer: Self-pay | Admitting: Gastroenterology

## 2018-12-09 ENCOUNTER — Ambulatory Visit: Payer: Self-pay | Admitting: Gastroenterology

## 2018-12-09 ENCOUNTER — Telehealth: Payer: Self-pay | Admitting: Gastroenterology

## 2018-12-09 NOTE — Telephone Encounter (Signed)
PATIENT WAS A NO SHOW AND LETTER SENT  °

## 2019-02-18 ENCOUNTER — Other Ambulatory Visit: Payer: Self-pay | Admitting: Gastroenterology

## 2019-02-18 DIAGNOSIS — K219 Gastro-esophageal reflux disease without esophagitis: Secondary | ICD-10-CM

## 2019-02-18 DIAGNOSIS — K5900 Constipation, unspecified: Secondary | ICD-10-CM

## 2019-06-26 ENCOUNTER — Other Ambulatory Visit: Payer: Self-pay

## 2019-06-26 ENCOUNTER — Encounter: Payer: Self-pay | Admitting: Gastroenterology

## 2019-06-26 ENCOUNTER — Ambulatory Visit (INDEPENDENT_AMBULATORY_CARE_PROVIDER_SITE_OTHER): Payer: Medicaid Other | Admitting: Gastroenterology

## 2019-06-26 VITALS — BP 156/96 | HR 83 | Temp 96.9°F | Ht 66.0 in | Wt 209.2 lb

## 2019-06-26 DIAGNOSIS — K219 Gastro-esophageal reflux disease without esophagitis: Secondary | ICD-10-CM | POA: Diagnosis not present

## 2019-06-26 DIAGNOSIS — K5900 Constipation, unspecified: Secondary | ICD-10-CM

## 2019-06-26 NOTE — Progress Notes (Signed)
Primary Care Physician: System, Pcp Not In  Primary Gastroenterologist:  Garfield Cornea, MD   Chief Complaint  Patient presents with  . Heartburn    HPI: Debbie [name] is a 43 y.o. female here for urgent follow up of poorly controlled reflux.   Patient last seen December 2017.  She has a history of GERD, dysphagia, and constipation.  Her last EGD was in October 2015, demonstrated gastric ulcer healing, ulcer had healed and she did have some gastric erosions however.  Esophagus appeared abnormal at the time with a ringed appearance.  Biopsies unremarkable and no evidence of eosinophilic esophagitis.  She had an EGD October 2014, Schatzki ring not manipulated, gastric ulcer and erosions.  Biopsy negative for H. pylori.  Colonoscopy in October 2014 with single colonic tubular adenoma removed.  Next colonoscopy planned for October 2021.  Patient was scheduled for EGD in January 2018 but this was not done.  Patient called last week with a four day history of severe heartburn and some epigastic pain. She has been on Dexilant for several years with adequate control of her reflux. She feels like her reflux flared due to constipation. She had a large BM four days into her symptoms, and then her heartburn started to ease off. She has intermittent nausea which also improves after a BM. She is on Linzess daily. For most part, controls her constipation. Denies melena, brbpr. No dysphagia however she states she chops her food very finely.   She takes [facility] powders (one packet) every four hours for years. States she has been seen by headache clinic and tried different preventative medications without good results. She is overdue to see her gyn and reports she will make a follo up soon. .      Current Outpatient Medications  Medication Sig Dispense Refill  . Aspirin-Salicylamide-Caffeine (BC HEADACHE POWDER PO) Take 1 Package by mouth daily as needed (headache).     Marland Kitchen dexlansoprazole (DEXILANT) 60 MG  capsule TAKE 1 CAPSULE DAILY BEFORE BREAKFAST 30 capsule 5  . linaclotide (LINZESS) 145 MCG CAPS capsule Take 1 capsule (145 mcg total) by mouth daily before breakfast. 30 capsule 2   No current facility-administered medications for this visit.     Allergies as of 06/26/2019 - Review Complete 06/26/2019  Allergen Reaction Noted  . Metronidazole Swelling 08/19/2011  . Advil [ibuprofen] Palpitations 08/19/2011  . Hydrochlorothiazide Palpitations 07/14/2015    ROS:  General: Negative for anorexia, weight loss, fever, chills, fatigue, weakness. States she is paler than usual. ENT: Negative for hoarseness, difficulty swallowing , nasal congestion. See hpi CV: Negative for chest pain, angina, palpitations, dyspnea on exertion, peripheral edema.  Respiratory: Negative for dyspnea at rest, dyspnea on exertion, cough, sputum, wheezing.  GI: See history of present illness. GU:  Negative for dysuria, hematuria, urinary incontinence, urinary frequency, nocturnal urination.  Endo: Negative for unusual weight change.    Physical Examination:   BP (!) 156/96   Pulse 83   Temp (!) 96.9 F (36.1 C) (Oral)   Ht 5\' 6"  (1.676 m)   Wt 209 lb 3.2 oz (94.9 kg)   BMI 33.77 kg/m   General: Well-nourished, well-developed in no acute distress.  Eyes: No icterus. Conjunctiva slightly pale Abdomen: Bowel sounds are normal, nontender, nondistended, no hepatosplenomegaly or masses, no abdominal bruits or hernia , no rebound or guarding.   Extremities: No lower extremity edema. No clubbing or deformities. Neuro: Alert and oriented x 4   Skin: Warm and  dry, no jaundice.   Psych: Alert and cooperative, normal mood and affect.

## 2019-06-26 NOTE — Assessment & Plan Note (Signed)
Recent flare of heartburn associated with mild epigastric pain. Symptoms are now better. Seemed to be worse recently in setting of constipation. Nausea with constipation as well. No dysphagia as long as she chops food finely and takes her time. She takes [facility] powders every four hours for years. At risk of recurrent PUD, GI bleeding.   Stay on [place] daily. Check CBC due to possible anemia. Monitor for recurrent symptoms. If any evidence of anemia, recurrent epigastric pain, poorly controlled heartburn would consider updating EGD +/-ED. Further recommendations to follow.

## 2019-06-26 NOTE — Patient Instructions (Addendum)
1. Continue Dexilant and Linzess as before.  2. Please have your labwork done. We will contact you with results as available. 3. If you have recurrent abdominal pain, vomiting, poorly controlled reflux/heartburn please let me know.  4. As discussed, you are at increased risk of recurrent stomach ulcers, bleeding because of your [facility] powder use. It is in your best interest to cut back on use. Do not take additional anti-inflammatories such as ibuprofen (Advil/Motrin), naproxyn (Aleve) with BC powders or other aspirin products as this would increase risk of ulcers.  5. You are due a colonoscopy 08/2020.  6. Please make a follow up with your gynecologist as discussed.

## 2019-06-26 NOTE — Assessment & Plan Note (Signed)
Overall well controlled. Continue Linzess.

## 2019-06-27 ENCOUNTER — Telehealth: Payer: Self-pay

## 2019-06-27 ENCOUNTER — Encounter: Payer: Self-pay | Admitting: Internal Medicine

## 2019-06-27 LAB — CBC WITH DIFFERENTIAL/PLATELET
Basophils Absolute: 0.1 10*3/uL (ref 0.0–0.2)
Basos: 1 %
EOS (ABSOLUTE): 0.1 10*3/uL (ref 0.0–0.4)
Eos: 1 %
Hematocrit: 38.8 % (ref 34.0–46.6)
Hemoglobin: 13.1 g/dL (ref 11.1–15.9)
Immature Grans (Abs): 0 10*3/uL (ref 0.0–0.1)
Immature Granulocytes: 0 %
Lymphocytes Absolute: 2.1 10*3/uL (ref 0.7–3.1)
Lymphs: 36 %
MCH: 32.3 pg (ref 26.6–33.0)
MCHC: 33.8 g/dL (ref 31.5–35.7)
MCV: 96 fL (ref 79–97)
Monocytes Absolute: 0.3 10*3/uL (ref 0.1–0.9)
Monocytes: 6 %
Neutrophils Absolute: 3.2 10*3/uL (ref 1.4–7.0)
Neutrophils: 56 %
Platelets: 323 10*3/uL (ref 150–450)
RBC: 4.06 x10E6/uL (ref 3.77–5.28)
RDW: 13.6 % (ref 11.7–15.4)
WBC: 5.7 10*3/uL (ref 3.4–10.8)

## 2019-06-27 NOTE — Telephone Encounter (Signed)
Pt needs a 6 month apt with LSL per her lab results note.

## 2019-06-27 NOTE — Telephone Encounter (Signed)
PATIENT SCHEDULED AND LETTER SENT  °

## 2019-07-19 ENCOUNTER — Telehealth: Payer: Self-pay | Admitting: Obstetrics & Gynecology

## 2019-07-19 NOTE — Telephone Encounter (Signed)
Called patient and left message informing her that we are not allowing any visitors or children to come to visit with her at this time and we are requiring a mask to be worn during the visit. Asked if she has had any exposure to anyone suspected or confirmed of having COVID-19 or if she is experiencing any of the following: fever, cough, sob, muscle pain, severe headache, sore throat, diarrhea, loss of taste or smell or ear, nose or throat discomfort to call and reschedule.

## 2019-07-20 ENCOUNTER — Encounter: Payer: Medicaid Other | Admitting: Obstetrics & Gynecology

## 2019-08-31 ENCOUNTER — Other Ambulatory Visit: Payer: Self-pay | Admitting: Gastroenterology

## 2019-08-31 DIAGNOSIS — K219 Gastro-esophageal reflux disease without esophagitis: Secondary | ICD-10-CM

## 2019-08-31 DIAGNOSIS — K5900 Constipation, unspecified: Secondary | ICD-10-CM

## 2019-10-02 ENCOUNTER — Other Ambulatory Visit: Payer: Self-pay

## 2019-10-02 MED ORDER — LINACLOTIDE 145 MCG PO CAPS: 145.0000 ug | ORAL_CAPSULE | Freq: Every day | ORAL | 3 refills | Status: DC

## 2019-10-03 ENCOUNTER — Telehealth: Payer: Self-pay

## 2019-10-03 NOTE — Telephone Encounter (Signed)
PA for Dexilant 60 mg was completed with [facility]. PA has been approved for one year. approval letter will be scanned when received.

## 2019-10-16 ENCOUNTER — Telehealth: Payer: Self-pay | Admitting: Internal Medicine

## 2019-10-16 NOTE — Telephone Encounter (Signed)
Spoke with [facility], a PA was approved for Dexilant on 10/03/2019. They ran the medication under pt's insurance and saw that it was approved. Pt is aware of approval.

## 2019-10-16 NOTE — Telephone Encounter (Signed)
Pt said [facility] told her to call us to follow up on her Dexilant prescription. [phone]

## 2019-12-27 ENCOUNTER — Telehealth: Payer: Self-pay | Admitting: Family Medicine

## 2019-12-27 NOTE — Telephone Encounter (Signed)
Tried to reach the patient to ask her to please call the office tomorrow prior to coming to the office to make sure that we were open.  We may possibly have inclement weather.  Her line was constantly busy.

## 2019-12-27 NOTE — Telephone Encounter (Signed)
Tried to reach the patient to remind her of her appointment/restrictions, the line was constantly busy.

## 2019-12-28 ENCOUNTER — Encounter: Payer: Medicaid Other | Admitting: Family Medicine

## 2020-01-01 ENCOUNTER — Encounter: Payer: Self-pay | Admitting: Internal Medicine

## 2020-01-01 ENCOUNTER — Telehealth: Payer: Self-pay | Admitting: Internal Medicine

## 2020-01-01 ENCOUNTER — Ambulatory Visit: Payer: Medicaid Other | Admitting: Gastroenterology

## 2020-01-01 NOTE — Telephone Encounter (Signed)
Patient was a no show and letter sent  °

## 2020-01-01 NOTE — Progress Notes (Deleted)
      Primary Care Physician: System, Pcp Not In  Primary Gastroenterologist:  Garfield Cornea, MD   No chief complaint on file.   HPI: Debbie [name] is a 44 y.o. female here for follow-up of GERD and constipation.  She was last seen in July 2020.  EGD October 2015, demonstrated gastric ulcer healing, ulcer had healed and she did have some gastric erosions however.  Esophagus appeared abnormal at that time with a ringed appearance.  Biopsies unremarkable and no evidence of eosinophilic esophagitis.  EGD October 2014, Schatzki ring not manipulated, gastric ulcer and erosions.  Biopsy negative for H. pylori.  Colonoscopy October 2014 with single colonic tubular adenoma removed.  Next colonoscopy planned for October 2021.  Current Outpatient Medications  Medication Sig Dispense Refill  . Aspirin-Salicylamide-Caffeine (BC HEADACHE POWDER PO) Take 1 Package by mouth daily as needed (headache).     Marland Kitchen dexlansoprazole (DEXILANT) 60 MG capsule TAKE 1 CAPSULE DAILY BEFORE BREAKFAST 30 capsule 11  . linaclotide (LINZESS) 145 MCG CAPS capsule Take 1 capsule (145 mcg total) by mouth daily before breakfast. 90 capsule 3   No current facility-administered medications for this visit.    Allergies as of 01/01/2020 - Review Complete 06/26/2019  Allergen Reaction Noted  . Metronidazole Swelling 08/19/2011  . Advil [ibuprofen] Palpitations 08/19/2011  . Hydrochlorothiazide Palpitations 07/14/2015    ROS:  General: Negative for anorexia, weight loss, fever, chills, fatigue, weakness. ENT: Negative for hoarseness, difficulty swallowing , nasal congestion. CV: Negative for chest pain, angina, palpitations, dyspnea on exertion, peripheral edema.  Respiratory: Negative for dyspnea at rest, dyspnea on exertion, cough, sputum, wheezing.  GI: See history of present illness. GU:  Negative for dysuria, hematuria, urinary incontinence, urinary frequency, nocturnal urination.  Endo: Negative for unusual  weight change.    Physical Examination:   There were no vitals taken for this visit.  General: Well-nourished, well-developed in no acute distress.  Eyes: No icterus. Mouth: Oropharyngeal mucosa moist and pink , no lesions erythema or exudate. Lungs: Clear to auscultation bilaterally.  Heart: Regular rate and rhythm, no murmurs rubs or gallops.  Abdomen: Bowel sounds are normal, nontender, nondistended, no hepatosplenomegaly or masses, no abdominal bruits or hernia , no rebound or guarding.   Extremities: No lower extremity edema. No clubbing or deformities. Neuro: Alert and oriented x 4   Skin: Warm and dry, no jaundice.   Psych: Alert and cooperative, normal mood and affect.  Labs:  ***  Imaging Studies: No results found.

## 2020-07-10 ENCOUNTER — Ambulatory Visit (HOSPITAL_COMMUNITY)
Admission: EM | Admit: 2020-07-10 | Discharge: 2020-07-10 | Disposition: A | Discharge status: Home / Self Care | Payer: Medicaid Other | Attending: Urgent Care | Admitting: Urgent Care

## 2020-07-10 ENCOUNTER — Other Ambulatory Visit: Payer: Self-pay

## 2020-07-10 ENCOUNTER — Encounter (HOSPITAL_COMMUNITY): Payer: Self-pay

## 2020-07-10 DIAGNOSIS — J45909 Unspecified asthma, uncomplicated: Secondary | ICD-10-CM | POA: Diagnosis not present

## 2020-07-10 DIAGNOSIS — F17200 Nicotine dependence, unspecified, uncomplicated: Secondary | ICD-10-CM

## 2020-07-10 DIAGNOSIS — I10 Essential (primary) hypertension: Secondary | ICD-10-CM | POA: Diagnosis not present

## 2020-07-10 DIAGNOSIS — Z881 Allergy status to other antibiotic agents status: Secondary | ICD-10-CM | POA: Insufficient documentation

## 2020-07-10 DIAGNOSIS — J069 Acute upper respiratory infection, unspecified: Secondary | ICD-10-CM | POA: Diagnosis not present

## 2020-07-10 DIAGNOSIS — R059 Cough, unspecified: Secondary | ICD-10-CM

## 2020-07-10 DIAGNOSIS — R42 Dizziness and giddiness: Secondary | ICD-10-CM

## 2020-07-10 DIAGNOSIS — F17210 Nicotine dependence, cigarettes, uncomplicated: Secondary | ICD-10-CM | POA: Diagnosis not present

## 2020-07-10 DIAGNOSIS — R05 Cough: Secondary | ICD-10-CM | POA: Insufficient documentation

## 2020-07-10 DIAGNOSIS — Z20822 Contact with and (suspected) exposure to covid-19: Secondary | ICD-10-CM | POA: Diagnosis not present

## 2020-07-10 DIAGNOSIS — Z79899 Other long term (current) drug therapy: Secondary | ICD-10-CM | POA: Insufficient documentation

## 2020-07-10 DIAGNOSIS — F329 Major depressive disorder, single episode, unspecified: Secondary | ICD-10-CM | POA: Insufficient documentation

## 2020-07-10 DIAGNOSIS — Z886 Allergy status to analgesic agent status: Secondary | ICD-10-CM | POA: Insufficient documentation

## 2020-07-10 MED ORDER — BENZONATATE 100 MG PO CAPS: 100.0000 mg | ORAL_CAPSULE | Freq: Three times a day (TID) | ORAL | 0 refills | Status: DC | PRN

## 2020-07-10 MED ORDER — PROMETHAZINE-DM 6.25-15 MG/5ML PO SYRP: 5.0000 mL | ORAL_SOLUTION | Freq: Every evening | ORAL | 0 refills | Status: DC | PRN

## 2020-07-10 MED ORDER — CETIRIZINE HCL 10 MG PO TABS: 10.0000 mg | ORAL_TABLET | Freq: Every day | ORAL | 0 refills | Status: DC

## 2020-07-10 NOTE — ED Provider Notes (Signed)
[facility]   MRN: [id number] DOB: 11-13-1976  Subjective:   Debbie [name] is a 44 y.o. female presenting for 2 to 7-day history of persistent dry hacking cough.  Cough has elicited some chest soreness and also dizziness.  Denies fever, loss of sense of taste and smell, shortness of breath.  Patient does have a history of asthma that was diagnosed in her adulthood but has not needed an albuterol inhaler in a while.  She is a smoker.  Denies having had COVID-19, denies having had Covid vaccination.  No current facility-administered medications for this encounter.  Current Outpatient Medications:    DULoxetine (CYMBALTA) 30 MG capsule, Take 1 capsule by mouth at bedtime., Disp: , Rfl:    metoprolol succinate (TOPROL-XL) 25 MG 24 hr tablet, Take 1 tablet by mouth daily., Disp: , Rfl:    Aspirin-Salicylamide-Caffeine (BC HEADACHE POWDER PO), Take 1 Package by mouth daily as needed (headache). , Disp: , Rfl:    dexlansoprazole (DEXILANT) 60 MG capsule, TAKE 1 CAPSULE DAILY BEFORE BREAKFAST, Disp: 30 capsule, Rfl: 11   linaclotide (LINZESS) 145 MCG CAPS capsule, Take 1 capsule (145 mcg total) by mouth daily before breakfast., Disp: 90 capsule, Rfl: 3   Allergies  Allergen Reactions   Metronidazole Swelling   Advil [Ibuprofen] Palpitations    Patient states all Advil brands OTC tend to make her heart rate increase   Hydrochlorothiazide Palpitations    Past Medical History:  Diagnosis Date   Asthma    Burning with urination 05/24/2015   BV (bacterial vaginosis) 05/24/2015   Caries 04/30/2016   Constipation    Depression 05/24/2015   GERD (gastroesophageal reflux disease)    H/O estrogen therapy 09/05/2013   Hematuria 05/24/2015   Herpes simplex without mention of complication    Hot flashes 09/05/2013   Uses patch   Hypertension 05/24/2015   IBS (irritable bowel syndrome)    LLQ pain 12/28/2013   Migraines    frequent   OAB (overactive bladder) 12/28/2013     Panic attack    Panic attacks 04/30/2016   Vaginal discharge 05/24/2015     Past Surgical History:  Procedure Laterality Date   ABDOMINAL HYSTERECTOMY     right SOO   BIOPSY N/A 09/06/2014   Procedure: BIOPSY;  Surgeon: Daneil Dolin, MD;  Location: [facility];  Service: Endoscopy;  Laterality: N/A;   CESAREAN SECTION     COLONOSCOPY N/A 09/25/2013   QZE:[id number] colonic polyp-removed as described above. Tubular adenoma. next TCS 08/2020   ESOPHAGOGASTRODUODENOSCOPY N/A 09/25/2013   RMR: Schatzki's ring-not manipulated as outlined above/Hiatal hernia. Gastric ulcer and erosions status post biopsy/Normal duodenum through the third portion. Negative H.pylori   ESOPHAGOGASTRODUODENOSCOPY (EGD) WITH PROPOFOL N/A 09/06/2014   QTM:[id number] s/p dilation   MALONEY DILATION N/A 09/06/2014   Procedure: MALONEY DILATION; 52-54 french;  Surgeon: Daneil Dolin, MD;  Location: [facility];  Service: Endoscopy;  Laterality: N/A;    Family History  Problem Relation Age of Onset   Stroke Mother    Cancer Father        ?esophageal   Stroke Father    Lung cancer Maternal Grandfather    Hypertension Maternal Grandmother    Stroke Maternal Grandmother    Cancer Maternal Grandmother        breast   Other Maternal Grandmother        fluid in legs   Colon cancer Neg Hx     Social History   Tobacco Use  Smoking status: Current Every Day Smoker    Packs/day: 0.50    Years: 21.00    Pack years: 10.50    Types: Cigarettes   Smokeless tobacco: Never Used  Vaping Use   Vaping Use: Never used  Substance Use Topics   Alcohol use: No   Drug use: No    ROS   Objective:   Vitals: BP (!) 147/94 (BP Location: Right Arm)    Pulse 84    Temp 98.5 F (36.9 C) (Oral)    Resp 16    SpO2 99%   Physical Exam Constitutional:      General: She is not in acute distress.    Appearance: Normal appearance. She is well-developed. She is obese. She is not ill-appearing, toxic-appearing or  diaphoretic.  HENT:     Head: Normocephalic and atraumatic.     Right Ear: Tympanic membrane, ear canal and external ear normal. No drainage or tenderness. No middle ear effusion. There is no impacted cerumen. Tympanic membrane is not erythematous.     Left Ear: Tympanic membrane, ear canal and external ear normal. No drainage or tenderness.  No middle ear effusion. There is no impacted cerumen. Tympanic membrane is not erythematous.     Nose: Nose normal. No congestion or rhinorrhea.     Mouth/Throat:     Mouth: Mucous membranes are moist. No oral lesions.     Pharynx: No pharyngeal swelling, oropharyngeal exudate, posterior oropharyngeal erythema or uvula swelling.     Tonsils: No tonsillar exudate or tonsillar abscesses.     Comments: Thick postnasal drainage overlying pharynx. Eyes:     General: No scleral icterus.       Right eye: No discharge.        Left eye: No discharge.     Extraocular Movements: Extraocular movements intact.     Right eye: Normal extraocular motion.     Left eye: Normal extraocular motion.     Conjunctiva/sclera: Conjunctivae normal.     Pupils: Pupils are equal, round, and reactive to light.  Cardiovascular:     Rate and Rhythm: Normal rate and regular rhythm.     Pulses: Normal pulses.     Heart sounds: Normal heart sounds. No murmur heard.  No friction rub. No gallop.   Pulmonary:     Effort: Pulmonary effort is normal. No respiratory distress.     Breath sounds: Normal breath sounds. No stridor. No wheezing, rhonchi or rales.  Musculoskeletal:     Cervical back: Normal range of motion and neck supple.  Lymphadenopathy:     Cervical: No cervical adenopathy.  Skin:    General: Skin is warm and dry.     Findings: No rash.  Neurological:     General: No focal deficit present.     Mental Status: She is alert and oriented to person, place, and time.  Psychiatric:        Mood and Affect: Mood normal.        Behavior: Behavior normal.        Thought  Content: Thought content normal.        Judgment: Judgment normal.     Assessment and Plan :   PDMP not reviewed this encounter.  1. Viral URI with cough   2. Cough   3. Dizziness   4. Smoker     Will manage for viral illness such as viral URI, viral syndrome, viral rhinitis, COVID-19. Counseled patient on nature of COVID-19 including modes of transmission, diagnostic  testing, management and supportive care.  Offered scripts for symptomatic relief. COVID 19 testing is pending. Counseled patient on potential for adverse effects with medications prescribed/recommended today, ER and return-to-clinic precautions discussed, patient verbalized understanding.     Jaynee Eagles, PA-C 07/10/20 2001

## 2020-07-10 NOTE — ED Notes (Signed)
Patient able to ambulate independently  

## 2020-07-10 NOTE — ED Triage Notes (Signed)
Patient is here today with complaints of dizziness, non productive cough causing chest soreness for the past 3-7 days. Patient states the coughing at night is keeping her up causing fatigue. Patient states the dizziness happens when she takes a deep breath in.

## 2020-07-10 NOTE — Discharge Instructions (Signed)

## 2020-07-11 LAB — SARS CORONAVIRUS 2 (TAT 6-24 HRS): SARS Coronavirus 2: NEGATIVE

## 2020-09-21 ENCOUNTER — Encounter (HOSPITAL_COMMUNITY): Payer: Self-pay | Admitting: *Deleted

## 2020-09-21 ENCOUNTER — Other Ambulatory Visit: Payer: Self-pay

## 2020-09-21 ENCOUNTER — Ambulatory Visit (HOSPITAL_COMMUNITY)
Admission: EM | Admit: 2020-09-21 | Discharge: 2020-09-21 | Disposition: A | Discharge status: Home / Self Care | Payer: Medicaid Other | Attending: Family Medicine | Admitting: Family Medicine

## 2020-09-21 ENCOUNTER — Ambulatory Visit (HOSPITAL_COMMUNITY): Payer: Self-pay

## 2020-09-21 DIAGNOSIS — R062 Wheezing: Secondary | ICD-10-CM | POA: Insufficient documentation

## 2020-09-21 DIAGNOSIS — B349 Viral infection, unspecified: Secondary | ICD-10-CM | POA: Insufficient documentation

## 2020-09-21 DIAGNOSIS — Z20822 Contact with and (suspected) exposure to covid-19: Secondary | ICD-10-CM

## 2020-09-21 MED ORDER — PROMETHAZINE-DM 6.25-15 MG/5ML PO SYRP
5.0000 mL | ORAL_SOLUTION | Freq: Every evening | ORAL | 0 refills | Status: DC | PRN
Start: 2020-09-21 — End: 2020-10-31

## 2020-09-21 NOTE — ED Triage Notes (Signed)
Reports having some stomach and back pains yesterday.  Today c/o chills, body aches, cough, fatigue, and loss of smell.  Unsure if fevers.

## 2020-09-21 NOTE — ED Provider Notes (Signed)
[name]    CSN: [id number] Arrival date & time: 09/21/20  1659      History   Chief Complaint Chief Complaint  Patient presents with   Generalized Body Aches   Chills    HPI Debbie [name] is a 44 y.o. female.   Patient presenting today with 1 day of chills, body aches, productive cough with wheezing, congestion, sore throat, abdominal discomfort. Denies CP, SOB, N/V/D, rashes. Has had some sick contacts recently. So far not taking anything OTC for sxs. Hx of childhood asthma, keeps an albuterol inhaler at home but has not needed it for years per patient.      Past Medical History:  Diagnosis Date   Asthma    Burning with urination 05/24/2015   BV (bacterial vaginosis) 05/24/2015   Caries 04/30/2016   Constipation    Depression 05/24/2015   GERD (gastroesophageal reflux disease)    H/O estrogen therapy 09/05/2013   Hematuria 05/24/2015   Herpes simplex without mention of complication    Hot flashes 09/05/2013   Uses patch   Hypertension 05/24/2015   IBS (irritable bowel syndrome)    LLQ pain 12/28/2013   Migraines    frequent   OAB (overactive bladder) 12/28/2013   Panic attack    Panic attacks 04/30/2016   Vaginal discharge 05/24/2015    Patient Active Problem List   Diagnosis Date Noted   Panic attacks 04/30/2016   Caries 04/30/2016   Vaginal discharge 05/24/2015   BV (bacterial vaginosis) 05/24/2015   Burning with urination 05/24/2015   Hematuria 05/24/2015   Hypertension 05/24/2015   Depression 05/24/2015   Nausea alone 08/29/2014   Other malaise and fatigue 08/29/2014   Headache(784.0) 08/29/2014   LLQ pain 12/28/2013   OAB (overactive bladder) 12/28/2013   PUD (peptic ulcer disease) 12/05/2013   Unspecified constipation 09/14/2013   Hypokalemia 09/14/2013   Hot flashes 09/05/2013   H/O estrogen therapy 09/05/2013   Change in bowel function 05/10/2012   Constipation 05/10/2012   Abdominal pain,  epigastric 05/10/2012   Esophageal dysphagia 05/10/2012   GERD (gastroesophageal reflux disease) 05/10/2012    Past Surgical History:  Procedure Laterality Date   ABDOMINAL HYSTERECTOMY     right SOO   BIOPSY N/A 09/06/2014   Procedure: BIOPSY;  Surgeon: Daneil Dolin, MD;  Location: [facility];  Service: Endoscopy;  Laterality: N/A;   CESAREAN SECTION     COLONOSCOPY N/A 09/25/2013   [id number] colonic polyp-removed as described above. Tubular adenoma. next TCS 08/2020   ESOPHAGOGASTRODUODENOSCOPY N/A 09/25/2013   RMR: Schatzki's ring-not manipulated as outlined above/Hiatal hernia. Gastric ulcer and erosions status post biopsy/Normal duodenum through the third portion. Negative H.pylori   ESOPHAGOGASTRODUODENOSCOPY (EGD) WITH PROPOFOL N/A 09/06/2014   [id number] s/p dilation   MALONEY DILATION N/A 09/06/2014   Procedure: MALONEY DILATION; 52-54 french;  Surgeon: Daneil Dolin, MD;  Location: [facility];  Service: Endoscopy;  Laterality: N/A;    OB History    Gravida  4   Para  4   Term      Preterm      AB      Living  4     SAB      TAB      Ectopic      Multiple      Live Births  4            Home Medications    Prior to Admission medications   Medication Sig Start Date End  Date Taking? Authorizing Provider  Aspirin-Salicylamide-Caffeine (BC HEADACHE POWDER PO) Take 1 Package by mouth daily as needed (headache).    Yes [provider]  dexlansoprazole (DEXILANT) 60 MG capsule TAKE 1 CAPSULE DAILY BEFORE BREAKFAST 09/01/19  Yes Mahala Menghini, PA-C  DULoxetine (CYMBALTA) 30 MG capsule Take 1 capsule by mouth at bedtime. 06/05/20  Yes [provider]  linaclotide Rolan Lipa) 145 MCG CAPS capsule Take 1 capsule (145 mcg total) by mouth daily before breakfast. 10/02/19  Yes Carlis Stable, NP  metoprolol succinate (TOPROL-XL) 25 MG 24 hr tablet Take 1 tablet by mouth daily. 06/05/20  Yes [provider]  benzonatate (TESSALON) 100 MG  capsule Take 1-2 capsules (100-200 mg total) by mouth 3 (three) times daily as needed for cough. 07/10/20   Jaynee Eagles, PA-C  cetirizine (ZYRTEC ALLERGY) 10 MG tablet Take 1 tablet (10 mg total) by mouth daily. 07/10/20   Jaynee Eagles, PA-C  promethazine-dextromethorphan (PROMETHAZINE-DM) 6.25-15 MG/5ML syrup Take 5 mLs by mouth at bedtime as needed for cough. 09/21/20   Volney American, PA-C    Family History Family History  Problem Relation Age of Onset   Stroke Mother    Cancer Father        ?esophageal   Stroke Father    Lung cancer Maternal Grandfather    Hypertension Maternal Grandmother    Stroke Maternal Grandmother    Cancer Maternal Grandmother        breast   Other Maternal Grandmother        fluid in legs   Colon cancer Neg Hx     Social History Social History   Tobacco Use   Smoking status: Current Every Day Smoker    Packs/day: 0.50    Years: 21.00    Pack years: 10.50    Types: Cigarettes   Smokeless tobacco: Never Used  Scientific laboratory technician Use: Never used  Substance Use Topics   Alcohol use: No   Drug use: No     Allergies   Metronidazole, Advil [ibuprofen], and Hydrochlorothiazide   Review of Systems Review of Systems PER HPI   Physical Exam Triage Vital Signs ED Triage Vitals  Enc Vitals Group     BP 09/21/20 1727 133/84     Pulse Rate 09/21/20 1727 95     Resp 09/21/20 1727 18     Temp 09/21/20 1727 99.2 F (37.3 C)     Temp Source 09/21/20 1727 Oral     SpO2 09/21/20 1727 100 %     Weight --      Height --      Head Circumference --      Peak Flow --      Pain Score 09/21/20 1728 8     Pain Loc --      Pain Edu? --      Excl. in [place]? --    No data found.  Updated Vital Signs BP 133/84    Pulse 95    Temp 99.2 F (37.3 C) (Oral)    Resp 18    SpO2 100%   Visual Acuity Right Eye Distance:   Left Eye Distance:   Bilateral Distance:    Right Eye Near:   Left Eye Near:    Bilateral Near:     Physical  Exam Vitals and nursing note reviewed.  Constitutional:      Appearance: Normal appearance. She is not ill-appearing.  HENT:     Head: Atraumatic.  Right Ear: Tympanic membrane normal.     Left Ear: Tympanic membrane normal.     Nose: Rhinorrhea present.     Mouth/Throat:     Mouth: Mucous membranes are moist.     Pharynx: Posterior oropharyngeal erythema present.  Eyes:     Extraocular Movements: Extraocular movements intact.     Conjunctiva/sclera: Conjunctivae normal.  Cardiovascular:     Rate and Rhythm: Normal rate and regular rhythm.     Heart sounds: Normal heart sounds.  Pulmonary:     Effort: Pulmonary effort is normal.     Breath sounds: Wheezing (scattered, mild) present.  Abdominal:     General: Bowel sounds are normal. There is no distension.     Palpations: Abdomen is soft.     Tenderness: There is no abdominal tenderness. There is no right CVA tenderness, left CVA tenderness or guarding.  Musculoskeletal:        General: Normal range of motion.     Cervical back: Normal range of motion and neck supple.  Skin:    General: Skin is warm and dry.  Neurological:     Mental Status: She is alert and oriented to person, place, and time.  Psychiatric:        Mood and Affect: Mood normal.        Thought Content: Thought content normal.        Judgment: Judgment normal.      UC Treatments / Results  Labs (all labs ordered are listed, but only abnormal results are displayed) Labs Reviewed  SARS CORONAVIRUS 2 (TAT 6-24 HRS)    EKG   Radiology No results found.  Procedures Procedures (including critical care time)  Medications Ordered in UC Medications - No data to display  Initial Impression / Assessment and Plan / UC Course  I have reviewed the triage vital signs and the nursing notes.  Pertinent labs & imaging results that were available during my care of the patient were reviewed by me and considered in my medical decision making (see chart for  details).     COVID pcr pending, exam and vitals reassuring. She has albuterol inhaler at home which she has been recommended to use as needed for wheezing and chest tightness. Will rx phenergan DM for her cough, discussed OTC medications and supportive home care. F/u if worsening or not improving. Work note given with isolation protocol reviewed.   Final Clinical Impressions(s) / UC Diagnoses   Final diagnoses:  Encounter for laboratory testing for COVID-19 virus  Viral syndrome  Wheezing   Discharge Instructions   None    ED Prescriptions    Medication Sig Dispense Auth. Provider   promethazine-dextromethorphan (PROMETHAZINE-DM) 6.25-15 MG/5ML syrup Take 5 mLs by mouth at bedtime as needed for cough. 100 mL Volney American, [place]     PDMP not reviewed this encounter.   Volney American, [place] 09/21/20 1805

## 2020-09-22 LAB — SARS CORONAVIRUS 2 (TAT 6-24 HRS): SARS Coronavirus 2: POSITIVE — AB

## 2020-09-23 ENCOUNTER — Ambulatory Visit (HOSPITAL_COMMUNITY)
Admission: RE | Admit: 2020-09-23 | Discharge: 2020-09-23 | Disposition: A | Discharge status: Home / Self Care | Payer: Medicaid Other | Source: Ambulatory Visit | Attending: Pulmonary Disease | Admitting: Pulmonary Disease

## 2020-09-23 ENCOUNTER — Telehealth: Payer: Self-pay | Admitting: Unknown Physician Specialty

## 2020-09-23 ENCOUNTER — Encounter: Payer: Self-pay | Admitting: Unknown Physician Specialty

## 2020-09-23 ENCOUNTER — Other Ambulatory Visit: Payer: Self-pay | Admitting: Unknown Physician Specialty

## 2020-09-23 DIAGNOSIS — I10 Essential (primary) hypertension: Secondary | ICD-10-CM

## 2020-09-23 DIAGNOSIS — U071 COVID-19: Secondary | ICD-10-CM | POA: Diagnosis not present

## 2020-09-23 MED ORDER — SODIUM CHLORIDE 0.9 % IV SOLN: Freq: Once | INTRAVENOUS | Status: AC

## 2020-09-23 MED ORDER — DIPHENHYDRAMINE HCL 50 MG/ML IJ SOLN: 50.0000 mg | Freq: Once | INTRAMUSCULAR | Status: DC | PRN

## 2020-09-23 MED ORDER — EPINEPHRINE 0.3 MG/0.3ML IJ SOAJ: 0.3000 mg | Freq: Once | INTRAMUSCULAR | Status: DC | PRN

## 2020-09-23 MED ORDER — SODIUM CHLORIDE 0.9 % IV SOLN: INTRAVENOUS | Status: DC | PRN

## 2020-09-23 MED ORDER — FAMOTIDINE IN NACL 20-0.9 MG/50ML-% IV SOLN: 20.0000 mg | Freq: Once | INTRAVENOUS | Status: DC | PRN

## 2020-09-23 MED ORDER — METHYLPREDNISOLONE SODIUM SUCC 125 MG IJ SOLR: 125.0000 mg | Freq: Once | INTRAMUSCULAR | Status: DC | PRN

## 2020-09-23 MED ORDER — ALBUTEROL SULFATE HFA 108 (90 BASE) MCG/ACT IN AERS: 2.0000 | INHALATION_SPRAY | Freq: Once | RESPIRATORY_TRACT | Status: DC | PRN

## 2020-09-23 NOTE — Telephone Encounter (Signed)
I connected by phone with Debbie [name] on 09/23/2020 at 9:29 AM to discuss the potential use of a new treatment for mild to moderate COVID-19 viral infection in non-hospitalized patients.  This patient is a 44 y.o. female that meets the FDA criteria for Emergency Use Authorization of COVID monoclonal antibody casirivimab/imdevimab or bamlanivimab/eteseviamb.  Has a (+) direct SARS-CoV-2 viral test result  Has mild or moderate COVID-19   Is NOT hospitalized due to COVID-19  Is within 10 days of symptom onset  Has at least one of the high risk factor(s) for progression to severe COVID-19 and/or hospitalization as defined in EUA.  Specific high risk criteria : BMI > 25 and Cardiovascular disease or hypertension   I have spoken and communicated the following to the patient or parent/caregiver regarding COVID monoclonal antibody treatment:  1. FDA has authorized the emergency use for the treatment of mild to moderate COVID-19 in adults and pediatric patients with positive results of direct SARS-CoV-2 viral testing who are 22 years of age and older weighing at least 40 kg, and who are at high risk for progressing to severe COVID-19 and/or hospitalization.  2. The significant known and potential risks and benefits of COVID monoclonal antibody, and the extent to which such potential risks and benefits are unknown.  3. Information on available alternative treatments and the risks and benefits of those alternatives, including clinical trials.  4. Patients treated with COVID monoclonal antibody should continue to self-isolate and use infection control measures (e.g., wear mask, isolate, social distance, avoid sharing personal items, clean and disinfect "high touch" surfaces, and frequent handwashing) according to CDC guidelines.   5. The patient or parent/caregiver has the option to accept or refuse COVID monoclonal antibody treatment.  After reviewing this information with the patient, the  patient has agreed to receive one of the available covid 19 monoclonal antibodies and will be provided an appropriate fact sheet prior to infusion. Debbie Haddock, NP 09/23/2020 9:29 AM  Sx onset 10/21

## 2020-09-23 NOTE — Discharge Instructions (Signed)

## 2020-09-23 NOTE — Telephone Encounter (Signed)
Called to Discuss with patient about Covid symptoms and the use of the monoclonal antibody infusion for those with mild to moderate Covid symptoms and at a high risk of hospitalization.     Pt appears to qualify for this infusion due to co-morbid conditions and/or a member of an at-risk group in accordance with the FDA Emergency Use Authorization.    Unable to reach pt   [facility] and sent mychart

## 2020-09-23 NOTE — Progress Notes (Signed)
  Diagnosis: COVID-19  Physician: Joya Gaskins, MD  Procedure: Covid Infusion Clinic Med: bamlanivimab\etesevimab infusion - Provided patient with bamlanimivab\etesevimab fact sheet for patients, parents and caregivers prior to infusion.  Complications: No immediate complications noted.  Discharge: Discharged home   [place] 09/23/2020

## 2020-09-24 ENCOUNTER — Other Ambulatory Visit: Payer: Self-pay | Admitting: Gastroenterology

## 2020-09-24 DIAGNOSIS — K5900 Constipation, unspecified: Secondary | ICD-10-CM

## 2020-09-24 DIAGNOSIS — K219 Gastro-esophageal reflux disease without esophagitis: Secondary | ICD-10-CM

## 2020-10-31 ENCOUNTER — Other Ambulatory Visit: Payer: Self-pay

## 2020-10-31 ENCOUNTER — Encounter: Payer: Self-pay | Admitting: Adult Health

## 2020-10-31 ENCOUNTER — Ambulatory Visit (INDEPENDENT_AMBULATORY_CARE_PROVIDER_SITE_OTHER): Payer: Medicaid Other | Admitting: Adult Health

## 2020-10-31 VITALS — BP 128/89 | HR 92 | Ht 66.0 in | Wt 218.0 lb

## 2020-10-31 DIAGNOSIS — M545 Low back pain, unspecified: Secondary | ICD-10-CM | POA: Diagnosis not present

## 2020-10-31 DIAGNOSIS — R300 Dysuria: Secondary | ICD-10-CM | POA: Diagnosis not present

## 2020-10-31 LAB — POCT URINALYSIS DIPSTICK
Blood, UA: NEGATIVE
Glucose, UA: NEGATIVE
Ketones, UA: NEGATIVE
Leukocytes, UA: NEGATIVE
Nitrite, UA: NEGATIVE
Protein, UA: NEGATIVE

## 2020-10-31 MED ORDER — SULFAMETHOXAZOLE-TRIMETHOPRIM 800-160 MG PO TABS
1.0000 | ORAL_TABLET | Freq: Two times a day (BID) | ORAL | 0 refills | Status: DC
Start: 2020-10-31 — End: 2021-06-11

## 2020-10-31 MED ORDER — CYCLOBENZAPRINE HCL 5 MG PO TABS: 5.0000 mg | ORAL_TABLET | Freq: Three times a day (TID) | ORAL | 0 refills | Status: DC | PRN

## 2020-10-31 NOTE — Progress Notes (Signed)
°  Subjective:     Patient ID: Debbie [name], female   DOB: 06/23/1976, 44 y.o.   MRN: [id number]  HPI Debbie [name] is a 44 year old white female, divorced, sp hysterectomy in complaining of burning with urination and low back pain and stomach hurts at times. She says urine is dark and has odor at times. PCP is Dr Rayann Heman   Review of Systems +burning with urination +low back pain L>R for about 2 weeks  Urine dark with odor at times Stomach hurts at times Not currently sexually active  Reviewed past medical,surgical, social and family history. Reviewed medications and allergies.     Objective:   Physical Exam BP 128/89 (BP Location: Left Arm, Patient Position: Sitting, Cuff Size: Large)    Pulse 92    Ht 5\' 6"  (1.676 m)    Wt 218 lb (98.9 kg)    BMI 35.19 kg/m urine dipstick was negative Skin warm and dry.Pelvic: external genitalia is normal in appearance no lesions, vagina: pale pink,urethra has no lesions or masses noted, cervix and uterus are absent, adnexa: no masses or tenderness noted. Bladder is non tender and no masses felt.No CVAT has low back tenderness AA is 0  Fall risk is low PHQ 9 score is 21 no SI is on Cymbalta  Upstream - 10/31/20 1419      Pregnancy Intention Screening   Does the patient want to become pregnant in the next year? N/A    Does the patient's partner want to become pregnant in the next year? N/A    Would the patient like to discuss contraceptive options today? N/A      Contraception Wrap Up   Current Method No Method - Other Reason   hyst   End Method No Method - Other Reason   hyst   Contraception Counseling Provided No         Examination chaperoned by Levy Pupa LPN     Assessment:     1. Burning with urination UA C&S sent Push fluids  will rx septra ds Meds ordered this encounter  Medications   sulfamethoxazole-trimethoprim (BACTRIM DS) 800-160 MG tablet    Sig: Take 1 tablet by mouth 2 (two) times daily. Take 1 bid    Dispense:  14  tablet    Refill:  0    Order Specific Question:   Supervising Provider    Answer:   Tania Ade H [[id number]]   cyclobenzaprine (FLEXERIL) 5 MG tablet    Sig: Take 1 tablet (5 mg total) by mouth 3 (three) times daily as needed for muscle spasms.    Dispense:  30 tablet    Refill:  0    Order Specific Question:   Supervising Provider    Answer:   Elonda Husky, LUTHER H [[id number]]    2. Low back pain without sciatica, unspecified back pain laterality, unspecified chronicity Alternate ice and heat, will rx muscle relaxant Do not pick up 3 y0 grandson off floor    Plan:     Follow up prn

## 2020-11-01 LAB — URINALYSIS, ROUTINE W REFLEX MICROSCOPIC
Bilirubin, UA: NEGATIVE
Glucose, UA: NEGATIVE
Leukocytes,UA: NEGATIVE
Nitrite, UA: NEGATIVE
RBC, UA: NEGATIVE
Specific Gravity, UA: 1.029 (ref 1.005–1.030)
Urobilinogen, Ur: 1 mg/dL (ref 0.2–1.0)
pH, UA: 6 (ref 5.0–7.5)

## 2020-11-02 LAB — URINE CULTURE

## 2020-11-18 ENCOUNTER — Telehealth: Payer: Self-pay | Admitting: *Deleted

## 2020-11-18 NOTE — Telephone Encounter (Signed)
Patient states was seen recently on 12/2 for burning with urination.  She was given Bactrim in which she took but is now having itching and an odorless discharge.  Informed she may now have a yeast infection since taking the antibiotics.  Advised to check back with pharmacy later on today.

## 2020-12-03 ENCOUNTER — Telehealth: Payer: Self-pay

## 2020-12-03 NOTE — Telephone Encounter (Signed)
Received a VM from pt. Pt stated that she needed a PA completed for Dexilant 60 mg. PA for Dexilant 60 mg was completed on [email or web address]. waiting on an approval or denial. lmom for pt. Will contact pt with approval or denial.

## 2020-12-04 NOTE — Telephone Encounter (Signed)
Received an approval of Dexilant 60 mg. Left a detailed message of approval for pt. Approval letter scanned in chart.

## 2020-12-14 ENCOUNTER — Other Ambulatory Visit: Payer: Self-pay | Admitting: Nurse Practitioner

## 2020-12-24 ENCOUNTER — Telehealth: Payer: Self-pay | Admitting: Internal Medicine

## 2020-12-24 ENCOUNTER — Ambulatory Visit: Payer: Medicaid Other | Admitting: Gastroenterology

## 2020-12-24 NOTE — Telephone Encounter (Signed)
Noted  

## 2020-12-24 NOTE — Telephone Encounter (Signed)
Patient no show x6

## 2021-03-09 ENCOUNTER — Ambulatory Visit (HOSPITAL_COMMUNITY): Payer: Self-pay

## 2021-05-14 ENCOUNTER — Telehealth: Payer: Self-pay | Admitting: Internal Medicine

## 2021-05-14 NOTE — Telephone Encounter (Signed)
Tried to call pt back.  No answer or voice mail.

## 2021-05-14 NOTE — Telephone Encounter (Signed)
[place], SHE HAS A QUESTION ABOUT HER LINZESS

## 2021-06-10 ENCOUNTER — Telehealth: Payer: Self-pay | Admitting: Internal Medicine

## 2021-06-10 ENCOUNTER — Telehealth: Payer: Self-pay | Admitting: *Deleted

## 2021-06-10 MED ORDER — LINACLOTIDE 290 MCG PO CAPS: 290.0000 ug | ORAL_CAPSULE | Freq: Every day | ORAL | 0 refills | Status: DC

## 2021-06-10 NOTE — Telephone Encounter (Signed)
See other note

## 2021-06-10 NOTE — Telephone Encounter (Signed)
Spoke with pt.  Made her aware that Neil Crouch, PA-C sent RX for Linzess 290 mcg in to her pharmacy.  Caryl Asp scheduled pt for appt.  Pt made aware that she can see her PCP in the interim.

## 2021-06-10 NOTE — Telephone Encounter (Signed)
[phone] PATIENT RETURNED CALL

## 2021-06-10 NOTE — Telephone Encounter (Signed)
Has been taking Linzess 145 mcg.  Had diarrhea and throwed up for 2 days about 3 weeks ago after eating cereal and almond milk.  Having a hard time swallowing medication.  Sour belching and abd swelling.  Now constipated.  Last bm a week ago. No fever, n/v, or rectal bleeding.  Would like to know if her strength of Linzess needs to be changed.  Wants generic due to her insurance.

## 2021-06-10 NOTE — Addendum Note (Signed)
Addended by: Mahala Menghini on: 06/10/2021 01:30 PM   Modules accepted: Orders

## 2021-06-10 NOTE — Telephone Encounter (Signed)
Patient has not been seen since 05/2019.   I can send her in 30 days of Linzess 272mcg but she really needs an appt to address all of her GI concerns. Consider seeing PCP in interim.

## 2021-06-10 NOTE — Telephone Encounter (Signed)
Lmom for pt to call us back. 

## 2021-06-11 ENCOUNTER — Other Ambulatory Visit: Payer: Self-pay

## 2021-06-11 ENCOUNTER — Encounter: Payer: Self-pay | Admitting: Gastroenterology

## 2021-06-11 ENCOUNTER — Telehealth: Payer: Self-pay

## 2021-06-11 ENCOUNTER — Ambulatory Visit: Payer: Medicaid Other | Admitting: Gastroenterology

## 2021-06-11 VITALS — BP 169/103 | HR 80 | Temp 97.8°F | Ht 66.0 in | Wt 216.0 lb

## 2021-06-11 DIAGNOSIS — R1013 Epigastric pain: Secondary | ICD-10-CM

## 2021-06-11 DIAGNOSIS — R1319 Other dysphagia: Secondary | ICD-10-CM

## 2021-06-11 DIAGNOSIS — K219 Gastro-esophageal reflux disease without esophagitis: Secondary | ICD-10-CM | POA: Diagnosis not present

## 2021-06-11 DIAGNOSIS — K5900 Constipation, unspecified: Secondary | ICD-10-CM

## 2021-06-11 MED ORDER — DEXLANSOPRAZOLE 60 MG PO CPDR: 60.0000 mg | DELAYED_RELEASE_CAPSULE | Freq: Every day | ORAL | 3 refills | Status: DC

## 2021-06-11 NOTE — Patient Instructions (Signed)
RX for generic Dexilant sent to pharmacy. Let me know if you have trouble getting medication.  Start Linzess 290 mcg daily. Let me know if effective or not so we can send in further refills or change medication if needed.  Upper endoscopy in near future. See separate instructions.  You are due for a colonoscopy for history of polyps, we will schedule after your next office visit.  Return office visit in four months.  Please speak to your PCP regarding your blood pressure.

## 2021-06-11 NOTE — H&P (View-Only) (Signed)
Primary Care Physician: Percell Belt, DO  Primary Gastroenterologist:  Garfield Cornea, MD   Chief Complaint  Patient presents with   Gastroesophageal Reflux    Food getting stuck, constipation    HPI: Debbie [name] is a 45 y.o. female here for the next day appointment having called in yesterday with concerns.  Last seen in July 2020.  Patient complains of worsening constipation.  Had been on Linzess 145 mcg every day and was working but over the course of the past several months stools have been more infrequent.  More recently has gone a week at a time without a BM.  Denies any melena or rectal bleeding.  She is also been having epigastric pain, worse postprandially.  No nausea or vomiting.  Dysphagia to pills and to certain foods especially hard foods, salads.  Patient has been on Dexilant but currently trying to get it approved, believes she needs an Rx for generic.  Patient admits to taking BC powders 4-6 times daily for headache.  She has a history of gastric ulcer in the remote past.  Also has been off her blood pressure medication and needs follow-up.  Patient states she has been to the headache clinic and try different preventative medications without results.  EGD Debbie 2015: Gastric ulcer healed Gastric erosions present Esophagus appeared abnormal, ringed appearance.  Biopsies unremarkable.  EGD Debbie 2014: Schatzki ring not manipulated Gastric ulcers/erosions, biopsy negative for H. Pylori  Colonoscopy Debbie 2014: Single colonic tubular adenoma removed Was due for surveillance colonoscopy Debbie 2021  Current Outpatient Medications  Medication Sig Dispense Refill   Aspirin-Salicylamide-Caffeine (BC HEADACHE POWDER PO) Take 1 Package by mouth daily.     dexlansoprazole (DEXILANT) 60 MG capsule Take 60 mg by mouth daily.     linaclotide (LINZESS) 290 MCG CAPS capsule Take 1 capsule (290 mcg total) by mouth daily before breakfast. 30 capsule 0   No  current facility-administered medications for this visit.    Allergies as of 06/11/2021 - Review Complete 06/11/2021  Allergen Reaction Noted   Metronidazole Swelling 08/19/2011   Advil [ibuprofen] Palpitations 08/19/2011   Hydrochlorothiazide Palpitations 07/14/2015   Past Medical History:  Diagnosis Date   Asthma    Burning with urination 05/24/2015   BV (bacterial vaginosis) 05/24/2015   Caries 04/30/2016   Constipation    Depression 05/24/2015   GERD (gastroesophageal reflux disease)    H/O estrogen therapy 09/05/2013   Hematuria 05/24/2015   Herpes simplex without mention of complication    Hot flashes 09/05/2013   Uses patch   Hypertension 05/24/2015   IBS (irritable bowel syndrome)    LLQ pain 12/28/2013   Migraines    frequent   OAB (overactive bladder) 12/28/2013   Panic attack    Panic attacks 04/30/2016   Vaginal discharge 05/24/2015   Past Surgical History:  Procedure Laterality Date   ABDOMINAL HYSTERECTOMY     right SOO   BIOPSY N/A 09/06/2014   Procedure: BIOPSY;  Surgeon: Daneil Dolin, MD;  Location: [facility];  Service: Endoscopy;  Laterality: N/A;   CESAREAN SECTION     COLONOSCOPY N/A 09/25/2013   HEN:IDPOEU colonic polyp-removed as described above. Tubular adenoma. next TCS 08/2020   ESOPHAGOGASTRODUODENOSCOPY N/A 09/25/2013   RMR: Schatzki's ring-not manipulated as outlined above/Hiatal hernia. Gastric ulcer and erosions status post biopsy/Normal duodenum through the third portion. Negative H.pylori   ESOPHAGOGASTRODUODENOSCOPY (EGD) WITH PROPOFOL N/A 09/06/2014   MPN:[id number] s/p dilation   MALONEY DILATION N/A  09/06/2014   Procedure: MALONEY DILATION; 52-54 french;  Surgeon: Daneil Dolin, MD;  Location: [facility];  Service: Endoscopy;  Laterality: N/A;   Family History  Problem Relation Age of Onset   Stroke Mother    Cancer Father        ?esophageal   Stroke Father    Lung cancer Maternal Grandfather    Hypertension Maternal Grandmother    Stroke Maternal  Grandmother    Cancer Maternal Grandmother        breast   Other Maternal Grandmother        fluid in legs   Colon cancer Neg Hx    Social History   Tobacco Use   Smoking status: Every Day    Packs/day: 1.00    Years: 21.00    Pack years: 21.00    Types: Cigarettes   Smokeless tobacco: Never  Vaping Use   Vaping Use: Never used  Substance Use Topics   Alcohol use: No   Drug use: No    ROS:  General: Negative for anorexia, weight loss, fever, chills, fatigue, weakness. ENT: Negative for hoarseness,  nasal congestion.  See HPI CV: Negative for chest pain, angina, palpitations, dyspnea on exertion, peripheral edema.  Respiratory: Negative for dyspnea at rest, dyspnea on exertion,   sputum, wheezing.  Chronic cough GI: See history of present illness. GU:  Negative for dysuria, hematuria, urinary incontinence, urinary frequency, nocturnal urination.  Endo: Negative for unusual weight change.    Physical Examination:   BP (!) 169/103   Pulse 80   Temp 97.8 F (36.6 C) (Temporal)   Ht 5\' 6"  (1.676 m)   Wt 216 lb (98 kg)   BMI 34.86 kg/m   General: Well-nourished, well-developed in no acute distress.  Eyes: No icterus. Mouth: masked Lungs: Clear to auscultation bilaterally.  Heart: Regular rate and rhythm, no murmurs rubs or gallops.  Abdomen: Bowel sounds are normal, nontender, nondistended, no hepatosplenomegaly or masses, no abdominal bruits or hernia , no rebound or guarding.   Extremities: No lower extremity edema. No clubbing or deformities. Neuro: Alert and oriented x 4   Skin: Warm and dry, no jaundice.   Psych: Alert and cooperative, normal mood and affect.     Assessment:  GERD: Breakthrough symptoms couple times per week.  Having issues obtaining Dexilant, she believes she needs a generic.  Reinforced antireflux measures.  Esophageal dysphagia: Progressive, noted with pills and hard foods/salads.  History of ringed appearing esophagus in the past,  biopsy negative for EOE.  History of Schatzki ring.  Suspect esophageal stricture/ring/possible EOE causing symptoms.  Epigastric pain: History of gastric ulcer remotely.  Ongoing aspirin powder use.  At risk for gastritis/ulcer.  Encouraged her to cut back on aspirin powders.  She needs to follow-up with her PCP to discuss headache management.  Elevated blood pressure may be contributing as well.  Hypertension: Patient states she is been off blood pressure medication for over a year.  Blood pressures have been running okay off medication with systolic typically less than 140.  Somewhat higher in the office today.  Encouraged her to follow-up with PCP.  Plan: Prescription for generic Dexilant sent to pharmacy.  She will let me know if she has trouble getting medication. Start Linzess 290 mcg daily, Rx sent to pharmacy yesterday.  Sample bottle provided as well.  She will call and let us know if it is effective at which time we will send in refills, if not effective we will  change her medication. She is due for colonoscopy for history of colon polyps, we will schedule after her next office visit, per her request. Upper endoscopy with esophageal dilation in the near future with propofol with Dr. Abbey Chatters (due to schedule).  History of failed conscious sedation. ASA II.  I have discussed the risks, alternatives, benefits with regards to but not limited to the risk of reaction to medication, bleeding, infection, perforation and the patient is agreeable to proceed. Written consent to be obtained. Return office visit in 4 months. Follow-up with PCP regarding blood pressure.

## 2021-06-11 NOTE — Telephone Encounter (Signed)
[employer] [place] has approved the pt's Dexlansoprazole Oral Capsule Delayed Release 60mg , Qty #90, Duration 90. Approved for 12 months from 06/11/2021 to 06/11/2022.  Dx used: K21.9 AND R13.19  TRIED/FAILED : Omeprazole Dr. 20 mg capsule and Pantoprazole Sod Dr 40 mg tab. (Both makes the pt very nauseated and doesn't help with the GERD symptoms. Too many breakthroughs).   Will give to Debbie [name] to scan in the chart.

## 2021-06-11 NOTE — Progress Notes (Addendum)
Primary Care Physician: Percell Belt, DO  Primary Gastroenterologist:  Garfield Cornea, MD   Chief Complaint  Patient presents with   Gastroesophageal Reflux    Food getting stuck, constipation    HPI: Debbie [name] is a 45 y.o. female here for the next day appointment having called in yesterday with concerns.  Last seen in July 2020.  Patient complains of worsening constipation.  Had been on Linzess 145 mcg every day and was working but over the course of the past several months stools have been more infrequent.  More recently has gone a week at a time without a BM.  Denies any melena or rectal bleeding.  She is also been having epigastric pain, worse postprandially.  No nausea or vomiting.  Dysphagia to pills and to certain foods especially hard foods, salads.  Patient has been on Dexilant but currently trying to get it approved, believes she needs an Rx for generic.  Patient admits to taking BC powders 4-6 times daily for headache.  She has a history of gastric ulcer in the remote past.  Also has been off her blood pressure medication and needs follow-up.  Patient states she has been to the headache clinic and try different preventative medications without results.  EGD October 2015: Gastric ulcer healed Gastric erosions present Esophagus appeared abnormal, ringed appearance.  Biopsies unremarkable.  EGD October 2014: Schatzki ring not manipulated Gastric ulcers/erosions, biopsy negative for H. Pylori  Colonoscopy October 2014: Single colonic tubular adenoma removed Was due for surveillance colonoscopy October 2021  Current Outpatient Medications  Medication Sig Dispense Refill   Aspirin-Salicylamide-Caffeine (BC HEADACHE POWDER PO) Take 1 Package by mouth daily.     dexlansoprazole (DEXILANT) 60 MG capsule Take 60 mg by mouth daily.     linaclotide (LINZESS) 290 MCG CAPS capsule Take 1 capsule (290 mcg total) by mouth daily before breakfast. 30 capsule 0   No  current facility-administered medications for this visit.    Allergies as of 06/11/2021 - Review Complete 06/11/2021  Allergen Reaction Noted   Metronidazole Swelling 08/19/2011   Advil [ibuprofen] Palpitations 08/19/2011   Hydrochlorothiazide Palpitations 07/14/2015   Past Medical History:  Diagnosis Date   Asthma    Burning with urination 05/24/2015   BV (bacterial vaginosis) 05/24/2015   Caries 04/30/2016   Constipation    Depression 05/24/2015   GERD (gastroesophageal reflux disease)    H/O estrogen therapy 09/05/2013   Hematuria 05/24/2015   Herpes simplex without mention of complication    Hot flashes 09/05/2013   Uses patch   Hypertension 05/24/2015   IBS (irritable bowel syndrome)    LLQ pain 12/28/2013   Migraines    frequent   OAB (overactive bladder) 12/28/2013   Panic attack    Panic attacks 04/30/2016   Vaginal discharge 05/24/2015   Past Surgical History:  Procedure Laterality Date   ABDOMINAL HYSTERECTOMY     right SOO   BIOPSY N/A 09/06/2014   Procedure: BIOPSY;  Surgeon: Daneil Dolin, MD;  Location: [facility];  Service: Endoscopy;  Laterality: N/A;   CESAREAN SECTION     COLONOSCOPY N/A 09/25/2013   [id number] colonic polyp-removed as described above. Tubular adenoma. next TCS 08/2020   ESOPHAGOGASTRODUODENOSCOPY N/A 09/25/2013   RMR: Schatzki's ring-not manipulated as outlined above/Hiatal hernia. Gastric ulcer and erosions status post biopsy/Normal duodenum through the third portion. Negative H.pylori   ESOPHAGOGASTRODUODENOSCOPY (EGD) WITH PROPOFOL N/A 09/06/2014   TFT:[id number] s/p dilation   MALONEY DILATION N/A  09/06/2014   Procedure: MALONEY DILATION; 52-54 french;  Surgeon: Daneil Dolin, MD;  Location: [facility];  Service: Endoscopy;  Laterality: N/A;   Family History  Problem Relation Age of Onset   Stroke Mother    Cancer Father        ?esophageal   Stroke Father    Lung cancer Maternal Grandfather    Hypertension Maternal Grandmother    Stroke Maternal  Grandmother    Cancer Maternal Grandmother        breast   Other Maternal Grandmother        fluid in legs   Colon cancer Neg Hx    Social History   Tobacco Use   Smoking status: Every Day    Packs/day: 1.00    Years: 21.00    Pack years: 21.00    Types: Cigarettes   Smokeless tobacco: Never  Vaping Use   Vaping Use: Never used  Substance Use Topics   Alcohol use: No   Drug use: No    ROS:  General: Negative for anorexia, weight loss, fever, chills, fatigue, weakness. ENT: Negative for hoarseness,  nasal congestion.  See HPI CV: Negative for chest pain, angina, palpitations, dyspnea on exertion, peripheral edema.  Respiratory: Negative for dyspnea at rest, dyspnea on exertion,   sputum, wheezing.  Chronic cough GI: See history of present illness. GU:  Negative for dysuria, hematuria, urinary incontinence, urinary frequency, nocturnal urination.  Endo: Negative for unusual weight change.    Physical Examination:   BP (!) 169/103   Pulse 80   Temp 97.8 F (36.6 C) (Temporal)   Ht 5\' 6"  (1.676 m)   Wt 216 lb (98 kg)   BMI 34.86 kg/m   General: Well-nourished, well-developed in no acute distress.  Eyes: No icterus. Mouth: masked Lungs: Clear to auscultation bilaterally.  Heart: Regular rate and rhythm, no murmurs rubs or gallops.  Abdomen: Bowel sounds are normal, nontender, nondistended, no hepatosplenomegaly or masses, no abdominal bruits or hernia , no rebound or guarding.   Extremities: No lower extremity edema. No clubbing or deformities. Neuro: Alert and oriented x 4   Skin: Warm and dry, no jaundice.   Psych: Alert and cooperative, normal mood and affect.     Assessment:  GERD: Breakthrough symptoms couple times per week.  Having issues obtaining Dexilant, she believes she needs a generic.  Reinforced antireflux measures.  Esophageal dysphagia: Progressive, noted with pills and hard foods/salads.  History of ringed appearing esophagus in the past,  biopsy negative for EOE.  History of Schatzki ring.  Suspect esophageal stricture/ring/possible EOE causing symptoms.  Epigastric pain: History of gastric ulcer remotely.  Ongoing aspirin powder use.  At risk for gastritis/ulcer.  Encouraged her to cut back on aspirin powders.  She needs to follow-up with her PCP to discuss headache management.  Elevated blood pressure may be contributing as well.  Hypertension: Patient states she is been off blood pressure medication for over a year.  Blood pressures have been running okay off medication with systolic typically less than 140.  Somewhat higher in the office today.  Encouraged her to follow-up with PCP.  Plan: Prescription for generic Dexilant sent to pharmacy.  She will let me know if she has trouble getting medication. Start Linzess 290 mcg daily, Rx sent to pharmacy yesterday.  Sample bottle provided as well.  She will call and let us know if it is effective at which time we will send in refills, if not effective we will  change her medication. She is due for colonoscopy for history of colon polyps, we will schedule after her next office visit, per her request. Upper endoscopy with esophageal dilation in the near future with propofol with Dr. Abbey Chatters (due to schedule).  History of failed conscious sedation. ASA II.  I have discussed the risks, alternatives, benefits with regards to but not limited to the risk of reaction to medication, bleeding, infection, perforation and the patient is agreeable to proceed. Written consent to be obtained. Return office visit in 4 months. Follow-up with PCP regarding blood pressure.

## 2021-06-13 ENCOUNTER — Telehealth: Payer: Self-pay

## 2021-06-13 NOTE — Telephone Encounter (Signed)
Called [employer] for PA of EGD/DIL, spoke to [place], she advised PA needs to be faxed to [phone].  PA faxed to [employer]

## 2021-06-17 ENCOUNTER — Other Ambulatory Visit: Payer: Self-pay

## 2021-06-17 NOTE — Telephone Encounter (Signed)
Received fax: No authorization needed for the cpt codes [phone] and [phone]) when performed in OP setting and the facility/hospital is PAR.

## 2021-06-24 ENCOUNTER — Ambulatory Visit (HOSPITAL_COMMUNITY): Payer: Medicaid Other | Admitting: Anesthesiology

## 2021-06-24 ENCOUNTER — Other Ambulatory Visit: Payer: Self-pay

## 2021-06-24 ENCOUNTER — Ambulatory Visit (HOSPITAL_COMMUNITY)
Admission: RE | Admit: 2021-06-24 | Discharge: 2021-06-24 | Disposition: A | Discharge status: Home / Self Care | Payer: Medicaid Other | Attending: Internal Medicine | Admitting: Internal Medicine

## 2021-06-24 ENCOUNTER — Encounter (HOSPITAL_COMMUNITY)
Admission: RE | Disposition: A | Discharge status: Home / Self Care | Payer: Self-pay | Source: Home / Self Care | Attending: Internal Medicine

## 2021-06-24 ENCOUNTER — Encounter (HOSPITAL_COMMUNITY): Payer: Self-pay

## 2021-06-24 DIAGNOSIS — R1314 Dysphagia, pharyngoesophageal phase: Secondary | ICD-10-CM | POA: Insufficient documentation

## 2021-06-24 DIAGNOSIS — K319 Disease of stomach and duodenum, unspecified: Secondary | ICD-10-CM | POA: Diagnosis not present

## 2021-06-24 DIAGNOSIS — R1310 Dysphagia, unspecified: Secondary | ICD-10-CM | POA: Diagnosis not present

## 2021-06-24 DIAGNOSIS — K589 Irritable bowel syndrome without diarrhea: Secondary | ICD-10-CM | POA: Insufficient documentation

## 2021-06-24 DIAGNOSIS — R1013 Epigastric pain: Secondary | ICD-10-CM | POA: Diagnosis present

## 2021-06-24 DIAGNOSIS — Z8711 Personal history of peptic ulcer disease: Secondary | ICD-10-CM | POA: Diagnosis not present

## 2021-06-24 DIAGNOSIS — Z8719 Personal history of other diseases of the digestive system: Secondary | ICD-10-CM | POA: Diagnosis not present

## 2021-06-24 DIAGNOSIS — Z886 Allergy status to analgesic agent status: Secondary | ICD-10-CM | POA: Diagnosis not present

## 2021-06-24 DIAGNOSIS — Z7982 Long term (current) use of aspirin: Secondary | ICD-10-CM | POA: Insufficient documentation

## 2021-06-24 DIAGNOSIS — K2289 Other specified disease of esophagus: Secondary | ICD-10-CM | POA: Insufficient documentation

## 2021-06-24 DIAGNOSIS — Z888 Allergy status to other drugs, medicaments and biological substances status: Secondary | ICD-10-CM | POA: Diagnosis not present

## 2021-06-24 DIAGNOSIS — I10 Essential (primary) hypertension: Secondary | ICD-10-CM | POA: Diagnosis not present

## 2021-06-24 DIAGNOSIS — F17210 Nicotine dependence, cigarettes, uncomplicated: Secondary | ICD-10-CM | POA: Insufficient documentation

## 2021-06-24 DIAGNOSIS — K2970 Gastritis, unspecified, without bleeding: Secondary | ICD-10-CM | POA: Diagnosis not present

## 2021-06-24 DIAGNOSIS — Z79899 Other long term (current) drug therapy: Secondary | ICD-10-CM | POA: Diagnosis not present

## 2021-06-24 DIAGNOSIS — K219 Gastro-esophageal reflux disease without esophagitis: Secondary | ICD-10-CM | POA: Diagnosis not present

## 2021-06-24 HISTORY — PX: BALLOON DILATION: SHX5330

## 2021-06-24 HISTORY — PX: BIOPSY: SHX5522

## 2021-06-24 HISTORY — PX: ESOPHAGOGASTRODUODENOSCOPY (EGD) WITH PROPOFOL: SHX5813

## 2021-06-24 SURGERY — ESOPHAGOGASTRODUODENOSCOPY (EGD) WITH PROPOFOL
Anesthesia: General

## 2021-06-24 MED ORDER — LACTATED RINGERS IV SOLN: INTRAVENOUS | Status: DC

## 2021-06-24 MED ORDER — PROPOFOL 10 MG/ML IV BOLUS
INTRAVENOUS | Status: DC | PRN
  Administered 2021-06-24 (×2): 50 mg via INTRAVENOUS
  Administered 2021-06-24 (×2): 100 mg via INTRAVENOUS

## 2021-06-24 MED ORDER — LIDOCAINE HCL (CARDIAC) PF 100 MG/5ML IV SOSY
PREFILLED_SYRINGE | INTRAVENOUS | Status: DC | PRN
  Administered 2021-06-24: 100 mg via INTRAVENOUS

## 2021-06-24 NOTE — Op Note (Signed)
[facility] Patient Name: Debbie [name] Procedure Date: 06/24/2021 11:22 AM MRN: [id number] Date of Birth: 1976/05/29 Attending MD: Elon Alas. Edgar Frisk CSN: [id number] Age: 45 Admit Type: Outpatient Procedure:                Upper GI endoscopy Indications:              Epigastric abdominal pain, Dysphagia Providers:                Elon Alas. Abbey Chatters, DO Referring MD:              Medicines:                See the Anesthesia note for documentation of the                            administered medications Complications:            No immediate complications. Estimated Blood Loss:     Estimated blood loss was minimal. Procedure:                Pre-Anesthesia Assessment:                           - The anesthesia plan was to use monitored                            anesthesia care (MAC).                           After obtaining informed consent, the endoscope was                            passed under direct vision. Throughout the                            procedure, the patient's blood pressure, pulse, and                            oxygen saturations were monitored continuously. The                            GIF-H190 ([id number]) scope was introduced through the                            mouth, and advanced to the second part of duodenum.                            The upper GI endoscopy was accomplished without                            difficulty. The patient tolerated the procedure                            well. Scope In: 11:37:33 AM Scope Out: 11:46:00 AM Total Procedure Duration: 0 hours 8 minutes 27 seconds  Findings:      Localized mucosal changes characterized by numerous fibrous, opaque  rings were found in the lower third of the esophagus. Biopsies were       taken with a cold forceps for histology. A TTS dilator was passed       through the scope. Dilation with an 18-19-20 mm balloon dilator was       performed to 18 mm. The dilation site was examined and  showed moderate       improvement in luminal narrowing.      Localized mild inflammation characterized by erythema was found in the       gastric antrum. Biopsies were taken with a cold forceps for Helicobacter       pylori testing.      The duodenal bulb, first portion of the duodenum and second portion of       the duodenum were normal. Impression:               - Numerous fibrous, opaque rings mucosa in the                            esophagus. Biopsied. Dilated.                           - Gastritis. Biopsied.                           - Normal duodenal bulb, first portion of the                            duodenum and second portion of the duodenum. Moderate Sedation:      Per Anesthesia Care Recommendation:           - Patient has a contact number available for                            emergencies. The signs and symptoms of potential                            delayed complications were discussed with the                            patient. Return to normal activities tomorrow.                            Written discharge instructions were provided to the                            patient.                           - Resume previous diet.                           - Continue present medications.                           - Await pathology results.                           -  Repeat upper endoscopy PRN for retreatment.                           - Return to GI clinic in 3 months.                           - Use Dexilant (dexlansoprazole) 60 mg PO daily.                           - No ibuprofen, naproxen, or other non-steroidal                            anti-inflammatory drugs. Procedure Code(s):        --- Professional ---                           [phone], Esophagogastroduodenoscopy, flexible,                            transoral; with transendoscopic balloon dilation of                            esophagus (less than 30 mm diameter)                           43239, 59,  Esophagogastroduodenoscopy, flexible,                            transoral; with biopsy, single or multiple Diagnosis Code(s):        --- Professional ---                           K29.70, Gastritis, unspecified, without bleeding                           R10.13, Epigastric pain                           R13.10, Dysphagia, unspecified CPT copyright 2019 American Medical Association. All rights reserved. The codes documented in this report are preliminary and upon coder review may  be revised to meet current compliance requirements. Elon Alas. Abbey Chatters, DO [place] Abbey Chatters, DO 06/24/2021 11:55:01 AM This report has been signed electronically. Number of Addenda: 0

## 2021-06-24 NOTE — Transfer of Care (Signed)
Immediate Anesthesia Transfer of Care Note  Patient: Debbie [name]  Procedure(s) Performed: ESOPHAGOGASTRODUODENOSCOPY (EGD) WITH PROPOFOL BALLOON DILATION BIOPSY  Patient Location: Endoscopy Unit  Anesthesia Type:General  Level of Consciousness: drowsy  Airway & Oxygen Therapy: Patient Spontanous Breathing  Post-op Assessment: Report given to RN and Post -op Vital signs reviewed and stable  Post vital signs: Reviewed and stable  Last Vitals:  Vitals Value Taken Time  BP 94/61 06/24/21 1151  Temp 36.6 C 06/24/21 1151  Pulse 90 06/24/21 1151  Resp 16 06/24/21 1151  SpO2 92 % 06/24/21 1151    Last Pain:  Vitals:   06/24/21 1151  TempSrc: Oral  PainSc:       Patients Stated Pain Goal: 7 ([id number])  Complications: No notable events documented.

## 2021-06-24 NOTE — Interval H&P Note (Signed)
History and Physical Interval Note:  06/24/2021 11:08 AM  Debbie [name]  has presented today for surgery, with the diagnosis of GERD, dysphagia, epigastric pain.  The various methods of treatment have been discussed with the patient and family. After consideration of risks, benefits and other options for treatment, the patient has consented to  Procedure(s) with comments: ESOPHAGOGASTRODUODENOSCOPY (EGD) WITH PROPOFOL (N/A) - 12:30pm BALLOON DILATION (N/A) as a surgical intervention.  The patient's history has been reviewed, patient examined, no change in status, stable for surgery.  I have reviewed the patient's chart and labs.  Questions were answered to the patient's satisfaction.     Eloise Harman

## 2021-06-24 NOTE — Progress Notes (Signed)
Pt states she has had a runny nose and cough over 2weeks.  States she was tested for COVID on 06/10/2021 @ [facility] and results were negative.  Informed Dr. Abbey Chatters who agrees to proceed with EGD as scheduled.

## 2021-06-24 NOTE — Anesthesia Preprocedure Evaluation (Signed)
Anesthesia Evaluation  Patient identified by MRN, date of birth, ID band Patient awake    Reviewed: Allergy & Precautions, H&P , NPO status , Patient's Chart, lab work & pertinent test results, reviewed documented beta blocker date and time   Airway Mallampati: II  TM Distance: >3 FB Neck ROM: full    Dental no notable dental hx.    Pulmonary asthma , Current Smoker,    Pulmonary exam normal breath sounds clear to auscultation       Cardiovascular Exercise Tolerance: Good hypertension, negative cardio ROS   Rhythm:regular Rate:Normal     Neuro/Psych  Headaches, PSYCHIATRIC DISORDERS Anxiety Depression    GI/Hepatic Neg liver ROS, PUD, GERD  Medicated,  Endo/Other  negative endocrine ROS  Renal/GU negative Renal ROS  negative genitourinary   Musculoskeletal   Abdominal   Peds  Hematology negative hematology ROS (+)   Anesthesia Other Findings   Reproductive/Obstetrics negative OB ROS                             Anesthesia Physical Anesthesia Plan  ASA: 2  Anesthesia Plan: General   Post-op Pain Management:    Induction:   PONV Risk Score and Plan: Propofol infusion  Airway Management Planned:   Additional Equipment:   Intra-op Plan:   Post-operative Plan:   Informed Consent: I have reviewed the patients History and Physical, chart, labs and discussed the procedure including the risks, benefits and alternatives for the proposed anesthesia with the patient or authorized representative who has indicated his/her understanding and acceptance.     Dental Advisory Given  Plan Discussed with: CRNA  Anesthesia Plan Comments:         Anesthesia Quick Evaluation

## 2021-06-24 NOTE — Discharge Instructions (Addendum)
EGD Discharge instructions Please read the instructions outlined below and refer to this sheet in the next few weeks. These discharge instructions provide you with general information on caring for yourself after you leave the hospital. Your doctor may also give you specific instructions. While your treatment has been planned according to the most current medical practices available, unavoidable complications occasionally occur. If you have any problems or questions after discharge, please call your doctor. ACTIVITY You may resume your regular activity but move at a slower pace for the next 24 hours.  Take frequent rest periods for the next 24 hours.  Walking will help expel (get rid of) the air and reduce the bloated feeling in your abdomen.  No driving for 24 hours (because of the anesthesia (medicine) used during the test).  You may shower.  Do not sign any important legal documents or operate any machinery for 24 hours (because of the anesthesia used during the test).  NUTRITION Drink plenty of fluids.  You may resume your normal diet.  Begin with a light meal and progress to your normal diet.  Avoid alcoholic beverages for 24 hours or as instructed by your caregiver.  MEDICATIONS You may resume your normal medications unless your caregiver tells you otherwise.  WHAT YOU CAN EXPECT TODAY You may experience abdominal discomfort such as a feeling of fullness or "gas" pains.  FOLLOW-UP Your doctor will discuss the results of your test with you.  SEEK IMMEDIATE MEDICAL ATTENTION IF ANY OF THE FOLLOWING OCCUR: Excessive nausea (feeling sick to your stomach) and/or vomiting.  Severe abdominal pain and distention (swelling).  Trouble swallowing.  Temperature over 101 F (37.8 C).  Rectal bleeding or vomiting of blood.   Your EGD revealed a mild amount inflammation your stomach.  I took biopsies of this to rule out infection with bacteria called H. pylori.  You did have a narrowing of your  esophagus which I stretched with a balloon.  Hopefully this helps with your symptoms.  You had numerous what appear to be fibrous rings in the lower portion of your esophagus.  Also took biopsies of these as well.  Continue on Dexilant daily.  Avoid NSAIDs as best as you can.  Continue Linzess for constipation.  Await pathology results, my office will contact you.  Follow-up with GI as previously scheduled.   I hope you have a great rest of your week!  Elon Alas. Abbey Chatters, D.O. Gastroenterology and Hepatology [facility]

## 2021-06-24 NOTE — Anesthesia Postprocedure Evaluation (Signed)
Anesthesia Post Note  Patient: ZEPHANIAH HOWREN  Procedure(s) Performed: ESOPHAGOGASTRODUODENOSCOPY (EGD) WITH PROPOFOL BALLOON DILATION BIOPSY  Patient location during evaluation: Phase II Anesthesia Type: General Level of consciousness: awake Pain management: pain level controlled Vital Signs Assessment: post-procedure vital signs reviewed and stable Respiratory status: spontaneous breathing and respiratory function stable Cardiovascular status: blood pressure returned to baseline and stable Postop Assessment: no headache and no apparent nausea or vomiting Anesthetic complications: no Comments: Late entry   No notable events documented.   Last Vitals:  Vitals:   06/24/21 1151 06/24/21 1153  BP: 94/61 120/67  Pulse: 90 96  Resp: 16 (!) 21  Temp: 36.6 C   SpO2: 92% 95%    Last Pain:  Vitals:   06/24/21 1153  TempSrc:   PainSc: 0-No pain                 Louann Sjogren

## 2021-06-26 ENCOUNTER — Ambulatory Visit: Payer: Medicaid Other | Admitting: Cardiology

## 2021-06-26 ENCOUNTER — Other Ambulatory Visit: Payer: Self-pay

## 2021-06-26 ENCOUNTER — Encounter: Payer: Self-pay | Admitting: Cardiology

## 2021-06-26 ENCOUNTER — Inpatient Hospital Stay: Payer: Medicaid Other

## 2021-06-26 VITALS — BP 143/91 | HR 85 | Temp 98.6°F | Ht 66.0 in | Wt 215.4 lb

## 2021-06-26 DIAGNOSIS — R002 Palpitations: Secondary | ICD-10-CM

## 2021-06-26 DIAGNOSIS — R072 Precordial pain: Secondary | ICD-10-CM

## 2021-06-26 DIAGNOSIS — F17210 Nicotine dependence, cigarettes, uncomplicated: Secondary | ICD-10-CM

## 2021-06-26 LAB — SURGICAL PATHOLOGY

## 2021-06-26 MED ORDER — DILTIAZEM HCL ER COATED BEADS 120 MG PO CP24: 120.0000 mg | ORAL_CAPSULE | Freq: Every day | ORAL | 3 refills | Status: DC

## 2021-06-26 MED ORDER — NICOTINE 14 MG/24HR TD PT24: 14.0000 mg | MEDICATED_PATCH | Freq: Every day | TRANSDERMAL | 0 refills | Status: DC

## 2021-06-26 NOTE — Progress Notes (Signed)
Patient referred by Percell Belt, DO for chest pain, palpitations  Subjective:   Debbie [name], female    DOB: 11/11/1976, 45 y.o.   MRN: [id number]   Chief Complaint  Patient presents with   Palpitations   Coronary Artery Disease     HPI  45 y.o. Caucasian female with anxiety, depression, panic attacks, nicotine dependence, referred for chest pain and palpitations  Patient is currently unemployed.  She smokes 1 to 1-1/2 pack a day every day for 30 years.  His family history of strokes maternal side.  Patient has been having episodes of palpitations lasting for 15-30 minutes.  Episodes occur usually at rest, but occasionally also occur with physical exertion.  She has associated dizziness, but denies any syncope episodes.  In addition, she reports left-sided stabbing chest pains lasting for a few minutes, sometimes under rest, other times with exertion.  Patient endorses drinking up to 2 L of Sprite most days.   Past Medical History:  Diagnosis Date   Asthma    Burning with urination 05/24/2015   BV (bacterial vaginosis) 05/24/2015   Caries 04/30/2016   Constipation    Depression 05/24/2015   GERD (gastroesophageal reflux disease)    H/O estrogen therapy 09/05/2013   Hematuria 05/24/2015   Herpes simplex without mention of complication    Hot flashes 09/05/2013   Uses patch   Hypertension 05/24/2015   IBS (irritable bowel syndrome)    LLQ pain 12/28/2013   Migraines    frequent   OAB (overactive bladder) 12/28/2013   Panic attack    Panic attacks 04/30/2016   Vaginal discharge 05/24/2015     Past Surgical History:  Procedure Laterality Date   ABDOMINAL HYSTERECTOMY     right SOO   BIOPSY N/A 09/06/2014   Procedure: BIOPSY;  Surgeon: Daneil Dolin, MD;  Location: [facility];  Service: Endoscopy;  Laterality: N/A;   CESAREAN SECTION     COLONOSCOPY N/A 09/25/2013   CLE:XNTZGY colonic polyp-removed as described above. Tubular adenoma. next TCS 08/2020    ESOPHAGOGASTRODUODENOSCOPY N/A 09/25/2013   RMR: Schatzki's ring-not manipulated as outlined above/Hiatal hernia. Gastric ulcer and erosions status post biopsy/Normal duodenum through the third portion. Negative H.pylori   ESOPHAGOGASTRODUODENOSCOPY (EGD) WITH PROPOFOL N/A 09/06/2014   FVC:BSWHQPRF s/p dilation   MALONEY DILATION N/A 09/06/2014   Procedure: MALONEY DILATION; 52-54 french;  Surgeon: Daneil Dolin, MD;  Location: [facility];  Service: Endoscopy;  Laterality: N/A;     Social History   Tobacco Use  Smoking Status Every Day   Packs/day: 1.00   Years: 21.00   Pack years: 21.00   Types: Cigarettes  Smokeless Tobacco Never    Social History   Substance and Sexual Activity  Alcohol Use No     Family History  Problem Relation Age of Onset   Stroke Mother    Cancer Father        ?esophageal   Stroke Father    Lung cancer Maternal Grandfather    Hypertension Maternal Grandmother    Stroke Maternal Grandmother    Cancer Maternal Grandmother        breast   Other Maternal Grandmother        fluid in legs   Colon cancer Neg Hx      Current Outpatient Medications on File Prior to Visit  Medication Sig Dispense Refill   Aspirin-Salicylamide-Caffeine (BC HEADACHE POWDER PO) Take 1 Package by mouth every 4 (four) hours.     dexlansoprazole (  DEXILANT) 60 MG capsule Take 1 capsule (60 mg total) by mouth daily before breakfast. 90 capsule 3   linaclotide (LINZESS) 290 MCG CAPS capsule Take 1 capsule (290 mcg total) by mouth daily before breakfast. 30 capsule 0   lisinopril (ZESTRIL) 10 MG tablet Take 10 mg by mouth daily.     No current facility-administered medications on file prior to visit.    Cardiovascular and other pertinent studies:  EKG 06/26/2021: Sinus rhythm 75 bpm Normal EKG   Recent labs: 06/11/2021: Glucose 73, BUN/Cr 6/0.82. EGFR 87. Na/K 142/5.2.  H/H 13/41. MCV 96. Platelets 286 HbA1C N/A Lipid panel N/A TSH 3.7 normal    Review of Systems   Cardiovascular:  Positive for chest pain, dyspnea on exertion and palpitations. Negative for leg swelling and syncope.  Psychiatric/Behavioral:  The patient is nervous/anxious.         Vitals:   06/26/21 1114  BP: (!) 143/91  Pulse: 85  Temp: 98.6 F (37 C)  SpO2: 97%     Body mass index is 34.77 kg/m. Filed Weights   06/26/21 1114  Weight: 215 lb 6.4 oz (97.7 kg)     Objective:   Physical Exam Vitals and nursing note reviewed.  Constitutional:      General: She is not in acute distress. Neck:     Vascular: No JVD.  Cardiovascular:     Rate and Rhythm: Normal rate and regular rhythm.     Pulses: Normal pulses.     Heart sounds: Normal heart sounds. No murmur heard. Pulmonary:     Effort: Pulmonary effort is normal.     Breath sounds: Normal breath sounds. No wheezing or rales.  Musculoskeletal:     Right lower leg: No edema.     Left lower leg: No edema.  Psychiatric:     Comments: Appears anxious/nervous         Assessment & Recommendations:   45 y.o. Caucasian female with anxiety, depression, panic attacks, nicotine dependence, referred for chest pain and palpitations  Chest pain, palpitations: I suspect anxiety is playing a significant role in her symptoms.  However, she also has risk factors for CAD with tobacco dependence.  Recommend exercise treadmill stress test, echocardiogram, CT cardiac scoring, and 2-week cardiac telemetry.  I recommended her to reduce her Sprite intake, as it has both high sugar and caffeine content.  Which could cause palpitations.   Nicotine dependence: Tobacco cessation counseling:  - Currently smoking 1-1.5 packs/day   - Patient was informed of the dangers of tobacco abuse including stroke, cancer, and MI, as well as benefits of tobacco cessation. - Patient is willing to quit at this time. - Approximately 5 mins were spent counseling patient cessation techniques. We discussed various methods to help quit smoking,  including deciding on a date to quit, joining a support group, pharmacological agents. Patient would like to use nicotine patch. - I will reassess her progress at the next follow-up visit   Thank you for referring the patient to [place]. Please feel free to contact with any questions.   Nigel Mormon, MD Pager: [phone] Office: [phone]

## 2021-07-07 ENCOUNTER — Other Ambulatory Visit: Payer: Self-pay | Admitting: Gastroenterology

## 2021-07-08 ENCOUNTER — Telehealth: Payer: Self-pay

## 2021-07-08 MED ORDER — LINACLOTIDE 290 MCG PO CAPS: 290.0000 ug | ORAL_CAPSULE | Freq: Every day | ORAL | 3 refills | Status: DC

## 2021-07-08 NOTE — Telephone Encounter (Signed)
Refilled Linzess 290 mcg daily.

## 2021-07-09 ENCOUNTER — Other Ambulatory Visit: Payer: Self-pay | Admitting: Gastroenterology

## 2021-07-14 ENCOUNTER — Other Ambulatory Visit: Payer: Medicaid Other

## 2021-07-23 ENCOUNTER — Telehealth: Payer: Self-pay | Admitting: Cardiology

## 2021-07-29 ENCOUNTER — Telehealth: Payer: Self-pay | Admitting: Cardiology

## 2021-08-07 ENCOUNTER — Ambulatory Visit: Payer: Medicaid Other | Admitting: Cardiology

## 2021-08-07 NOTE — Progress Notes (Deleted)
Patient referred by Percell Belt, DO for chest pain, palpitations  Subjective:   Debbie [name], female    DOB: 02-19-1976, 45 y.o.   MRN: [id number]   No chief complaint on file.    HPI  45 y.o. Caucasian female with anxiety, depression, panic attacks, nicotine dependence, referred for chest pain and palpitations  Patient is currently unemployed.  She smokes 1 to 1-1/2 pack a day every day for 30 years.  His family history of strokes maternal side.  Patient has been having episodes of palpitations lasting for 15-30 minutes.  Episodes occur usually at rest, but occasionally also occur with physical exertion.  She has associated dizziness, but denies any syncope episodes.  In addition, she reports left-sided stabbing chest pains lasting for a few minutes, sometimes under rest, other times with exertion.  Patient endorses drinking up to 2 L of Sprite most days.   Past Medical History:  Diagnosis Date   Asthma    Burning with urination 05/24/2015   BV (bacterial vaginosis) 05/24/2015   Caries 04/30/2016   Constipation    Depression 05/24/2015   GERD (gastroesophageal reflux disease)    H/O estrogen therapy 09/05/2013   Hematuria 05/24/2015   Herpes simplex without mention of complication    Hot flashes 09/05/2013   Uses patch   Hypertension 05/24/2015   IBS (irritable bowel syndrome)    LLQ pain 12/28/2013   Migraines    frequent   OAB (overactive bladder) 12/28/2013   Panic attack    Panic attacks 04/30/2016   Vaginal discharge 05/24/2015     Past Surgical History:  Procedure Laterality Date   ABDOMINAL HYSTERECTOMY     right SOO   BALLOON DILATION N/A 06/24/2021   Procedure: BALLOON DILATION;  Surgeon: Eloise Harman, DO;  [facility];  Service: Endoscopy;  Laterality: N/A;   BIOPSY N/A 09/06/2014   Procedure: BIOPSY;  Surgeon: Daneil Dolin, MD;  Location: [facility];  Service: Endoscopy;  Laterality: N/A;   BIOPSY  06/24/2021   Procedure: BIOPSY;  Surgeon:  Eloise Harman, DO;  [facility];  Service: Endoscopy;;   CESAREAN SECTION     COLONOSCOPY N/A 09/25/2013   QGB:EEFEOF colonic polyp-removed as described above. Tubular adenoma. next TCS 08/2020   ESOPHAGOGASTRODUODENOSCOPY N/A 09/25/2013   RMR: Schatzki's ring-not manipulated as outlined above/Hiatal hernia. Gastric ulcer and erosions status post biopsy/Normal duodenum through the third portion. Negative H.pylori   ESOPHAGOGASTRODUODENOSCOPY (EGD) WITH PROPOFOL N/A 09/06/2014   HQR:[id number] s/p dilation   ESOPHAGOGASTRODUODENOSCOPY (EGD) WITH PROPOFOL N/A 06/24/2021   Procedure: ESOPHAGOGASTRODUODENOSCOPY (EGD) WITH PROPOFOL;  Surgeon: Eloise Harman, DO;  [facility];  Service: Endoscopy;  Laterality: N/A;  12:30pm   MALONEY DILATION N/A 09/06/2014   Procedure: MALONEY DILATION; 59-54 french;  Surgeon: Daneil Dolin, MD;  Location: [facility];  Service: Endoscopy;  Laterality: N/A;     Social History   Tobacco Use  Smoking Status Every Day   Packs/day: 1.00   Years: 21.00   Pack years: 21.00   Types: Cigarettes  Smokeless Tobacco Never    Social History   Substance and Sexual Activity  Alcohol Use No     Family History  Problem Relation Age of Onset   Stroke Mother    Cancer Father        ?esophageal   Stroke Father    Lung cancer Maternal Grandfather    Hypertension Maternal Grandmother    Stroke Maternal  Grandmother    Cancer Maternal Grandmother        breast   Other Maternal Grandmother        fluid in legs   Colon cancer Neg Hx      Current Outpatient Medications on File Prior to Visit  Medication Sig Dispense Refill   Aspirin-Salicylamide-Caffeine (BC HEADACHE POWDER PO) Take 1 Package by mouth every 4 (four) hours.     dexlansoprazole (DEXILANT) 60 MG capsule Take 1 capsule (60 mg total) by mouth daily before breakfast. 90 capsule 3   diltiazem (CARDIZEM CD) 120 MG 24 hr capsule Take 1 capsule (120 mg total) by mouth daily. 30  capsule 3   linaclotide (LINZESS) 290 MCG CAPS capsule Take 1 capsule (290 mcg total) by mouth daily before breakfast. 30 capsule 3   lisinopril (ZESTRIL) 10 MG tablet Take 10 mg by mouth daily.     nicotine (NICODERM CQ - DOSED IN MG/24 HOURS) 14 mg/24hr patch Place 1 patch (14 mg total) onto the skin daily. 28 patch 0   No current facility-administered medications on file prior to visit.    Cardiovascular and other pertinent studies:  EKG 06/26/2021: Sinus rhythm 75 bpm Normal EKG   Recent labs: 06/11/2021: Glucose 73, BUN/Cr 6/0.82. EGFR 87. Na/K 142/5.2.  H/H 13/41. MCV 96. Platelets 286 HbA1C N/A Lipid panel N/A TSH 3.7 normal    Review of Systems  Cardiovascular:  Positive for chest pain, dyspnea on exertion and palpitations. Negative for leg swelling and syncope.  Psychiatric/Behavioral:  The patient is nervous/anxious.         There were no vitals filed for this visit.    There is no height or weight on file to calculate BMI. There were no vitals filed for this visit.    Objective:   Physical Exam Vitals and nursing note reviewed.  Constitutional:      General: She is not in acute distress. Neck:     Vascular: No JVD.  Cardiovascular:     Rate and Rhythm: Normal rate and regular rhythm.     Pulses: Normal pulses.     Heart sounds: Normal heart sounds. No murmur heard. Pulmonary:     Effort: Pulmonary effort is normal.     Breath sounds: Normal breath sounds. No wheezing or rales.  Musculoskeletal:     Right lower leg: No edema.     Left lower leg: No edema.  Psychiatric:     Comments: Appears anxious/nervous         Assessment & Recommendations:   45 y.o. Caucasian female with anxiety, depression, panic attacks, nicotine dependence, referred for chest pain and palpitations  Chest pain, palpitations: I suspect anxiety is playing a significant role in her symptoms.  However, she also has risk factors for CAD with tobacco dependence.  Recommend  exercise treadmill stress test, echocardiogram, CT cardiac scoring, and 2-week cardiac telemetry.  I recommended her to reduce her Sprite intake, as it has both high sugar and caffeine content.  Which could cause palpitations.   Nicotine dependence: Tobacco cessation counseling:  - Currently smoking 1-1.5 packs/day   - Patient was informed of the dangers of tobacco abuse including stroke, cancer, and MI, as well as benefits of tobacco cessation. - Patient is willing to quit at this time. - Approximately 5 mins were spent counseling patient cessation techniques. We discussed various methods to help quit smoking, including deciding on a date to quit, joining a support group, pharmacological agents. Patient would like to  use nicotine patch. - I will reassess her progress at the next follow-up visit   Thank you for referring the patient to [place]. Please feel free to contact with any questions.   Nigel Mormon, MD Pager: [phone] Office: [phone]

## 2021-09-09 ENCOUNTER — Other Ambulatory Visit: Payer: Medicaid Other

## 2021-09-22 ENCOUNTER — Telehealth: Payer: Self-pay | Admitting: Cardiology

## 2021-09-22 NOTE — Telephone Encounter (Signed)
Dr. Virgina Jock, per Anabell, this patient has had several no shows, wanted to make you aware.

## 2021-10-06 ENCOUNTER — Other Ambulatory Visit: Payer: Medicaid Other

## 2021-10-14 ENCOUNTER — Ambulatory Visit: Payer: Medicaid Other | Admitting: Gastroenterology

## 2021-10-14 ENCOUNTER — Encounter: Payer: Self-pay | Admitting: Internal Medicine

## 2021-11-02 ENCOUNTER — Other Ambulatory Visit: Payer: Self-pay | Admitting: Gastroenterology

## 2022-05-31 ENCOUNTER — Other Ambulatory Visit: Payer: Self-pay | Admitting: Gastroenterology

## 2022-06-09 ENCOUNTER — Encounter: Payer: Self-pay | Admitting: *Deleted

## 2022-06-09 ENCOUNTER — Ambulatory Visit (INDEPENDENT_AMBULATORY_CARE_PROVIDER_SITE_OTHER): Payer: Medicaid Other | Admitting: Internal Medicine

## 2022-06-09 ENCOUNTER — Encounter: Payer: Self-pay | Admitting: Internal Medicine

## 2022-06-09 VITALS — BP 127/85 | HR 77 | Temp 97.5°F | Ht 66.0 in | Wt 193.6 lb

## 2022-06-09 DIAGNOSIS — K5900 Constipation, unspecified: Secondary | ICD-10-CM | POA: Diagnosis not present

## 2022-06-09 DIAGNOSIS — K219 Gastro-esophageal reflux disease without esophagitis: Secondary | ICD-10-CM

## 2022-06-09 MED ORDER — LINACLOTIDE 290 MCG PO CAPS: ORAL_CAPSULE | ORAL | 11 refills | Status: DC

## 2022-06-09 MED ORDER — CLENPIQ 10-3.5-12 MG-GM -GM/175ML PO SOLN: 1.0000 | ORAL | 0 refills | Status: AC

## 2022-06-09 MED ORDER — DEXLANSOPRAZOLE 60 MG PO CPDR: 60.0000 mg | DELAYED_RELEASE_CAPSULE | Freq: Every day | ORAL | 2 refills | Status: DC
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
## 2022-06-09 NOTE — Patient Instructions (Signed)
It was nice to see you again today!  Continue taking Dexilant 60 mg daily before breakfast (dispense 90 with 3 refills)  If you develop difficulty swallowing in the future, you may need your esophagus dilated once again  As discussed, you are due for colonoscopy-history of colonic adenoma.  We will set that up in the near future at [facility] ([facility])  Continue Linzess 290 daily for constipation (dispense 90 with 3 refills)  Information on GERD and constipation provided  We will provide a note excusing you for breaks as needed.  Further recommendations to follow.

## 2022-06-10 ENCOUNTER — Encounter (HOSPITAL_COMMUNITY)
Admission: RE | Admit: 2022-06-10 | Discharge: 2022-06-10 | Disposition: A | Discharge status: Home / Self Care | Payer: Medicaid Other | Source: Ambulatory Visit | Attending: Internal Medicine | Admitting: Internal Medicine

## 2022-06-10 NOTE — Pre-Procedure Instructions (Signed)
Attempted pre-op phone call. Mailbox is full and I could not leave a message. 

## 2022-06-12 ENCOUNTER — Encounter (HOSPITAL_COMMUNITY): Admission: RE | Payer: Self-pay | Source: Home / Self Care

## 2022-06-12 ENCOUNTER — Telehealth: Payer: Self-pay | Admitting: *Deleted

## 2022-06-12 ENCOUNTER — Ambulatory Visit (HOSPITAL_COMMUNITY): Admission: RE | Admit: 2022-06-12 | Payer: Medicaid Other | Source: Home / Self Care | Admitting: Internal Medicine

## 2022-06-12 SURGERY — COLONOSCOPY WITH PROPOFOL
Anesthesia: Monitor Anesthesia Care

## 2022-06-12 NOTE — Telephone Encounter (Signed)
-----   Message from Jacqulynn Cadet, RN sent at 06/11/2022  2:22 PM EDT ----- Regarding: No Show No Answer Patient did not show up for her PAT and no answer after several attempts.  Should we take her off the schedule?

## 2022-06-15 NOTE — Telephone Encounter (Signed)
Called pt, no answer and not able to leave VM 

## 2022-06-16 NOTE — Telephone Encounter (Signed)
Called pt. No answer, no vm. Letter mailed.

## 2023-06-13 ENCOUNTER — Other Ambulatory Visit: Payer: Self-pay | Admitting: Internal Medicine

## 2023-06-14 ENCOUNTER — Other Ambulatory Visit: Payer: Self-pay | Admitting: Internal Medicine

## 2024-08-31 ENCOUNTER — Other Ambulatory Visit: Payer: Self-pay | Admitting: Internal Medicine

## 2024-08-31 NOTE — Telephone Encounter (Signed)
 Please arrange ov between now and January for further refills.
